# Patient Record
Sex: Male | Born: 1975
Health system: Southern US, Community
[De-identification: ages and names within clinical notes are randomized; demographics above are authoritative.]

## PROBLEM LIST (undated history)

## (undated) DIAGNOSIS — S92309A Fracture of unspecified metatarsal bone(s), unspecified foot, initial encounter for closed fracture: Secondary | ICD-10-CM

## (undated) DIAGNOSIS — Z5189 Encounter for other specified aftercare: Secondary | ICD-10-CM

## (undated) DIAGNOSIS — I10 Essential (primary) hypertension: Secondary | ICD-10-CM

## (undated) HISTORY — DX: Essential (primary) hypertension: I10

## (undated) HISTORY — DX: Encounter for other specified aftercare: Z51.89

## (undated) HISTORY — PX: OTHER SURGICAL HISTORY: SHX169

---

## 2015-06-13 DIAGNOSIS — Z85820 Personal history of malignant melanoma of skin: Secondary | ICD-10-CM | POA: Diagnosis not present

## 2015-06-13 DIAGNOSIS — Z1283 Encounter for screening for malignant neoplasm of skin: Secondary | ICD-10-CM | POA: Diagnosis not present

## 2015-06-13 DIAGNOSIS — Z08 Encounter for follow-up examination after completed treatment for malignant neoplasm: Secondary | ICD-10-CM | POA: Diagnosis not present

## 2015-06-13 DIAGNOSIS — L301 Dyshidrosis [pompholyx]: Secondary | ICD-10-CM | POA: Diagnosis not present

## 2015-07-22 DIAGNOSIS — H31093 Other chorioretinal scars, bilateral: Secondary | ICD-10-CM | POA: Diagnosis not present

## 2015-07-22 DIAGNOSIS — H0015 Chalazion left lower eyelid: Secondary | ICD-10-CM | POA: Diagnosis not present

## 2015-07-29 DIAGNOSIS — H0015 Chalazion left lower eyelid: Secondary | ICD-10-CM | POA: Diagnosis not present

## 2015-07-29 MED FILL — POLYMYXIN B/TMP EYE DROPS: 10000-0.1 | 7 days supply | Qty: 10 | Fill #0

## 2015-12-02 DIAGNOSIS — S92314A Nondisplaced fracture of first metatarsal bone, right foot, initial encounter for closed fracture: Secondary | ICD-10-CM | POA: Diagnosis not present

## 2015-12-05 DIAGNOSIS — S92314D Nondisplaced fracture of first metatarsal bone, right foot, subsequent encounter for fracture with routine healing: Secondary | ICD-10-CM | POA: Diagnosis not present

## 2015-12-06 ENCOUNTER — Encounter (HOSPITAL_BASED_OUTPATIENT_CLINIC_OR_DEPARTMENT_OTHER): Payer: Self-pay | Admitting: *Deleted

## 2015-12-08 NOTE — H&P (Signed)
[facility] [address], [place] [address] [phone] A Division of [facility]                                                                     RE: RANELL, ALYEA    [id number]   1975-06-25 PROGRESS NOTE: 12-05-15 Reason for visit: Follow up right first metatarsal fracture on December 02, 2015.   History of present illness: Pain is well controlled.  This occurred when a trailer hit the top of his foot.   Please see associated documentation for this clinic visit for further past medical, family, surgical and social history, review of systems, and exam findings as this was reviewed by me.  EXAMINATION: Well appearing male in no apparent distress. In a splint.  He has painless passive motion.  He is neurovascularly intact to the first toe and the lesser toes.    X-RAYS: I reviewed his previous films which demonstrate a shortened fracture of the first metatarsal.   ASSESSMENT/PLAN: Due to the significant shortening I do think he would benefit from ORIF of his fracture.  I cautioned him he will be non-weight bearing for at least four weeks postoperatively, probably six weeks.  Discussed the risks and benefits of ORIF of this fracture.  He would like to go forward with that.    Ernesta Amble.  Percell Miller, M.D.  Electronically verified by Ernesta Amble. Percell Miller, M.D. TDM:jjh D 11-06-15 T 11-07-15

## 2015-12-09 ENCOUNTER — Ambulatory Visit (HOSPITAL_BASED_OUTPATIENT_CLINIC_OR_DEPARTMENT_OTHER): Payer: 59 | Admitting: Certified Registered"

## 2015-12-09 ENCOUNTER — Encounter (HOSPITAL_BASED_OUTPATIENT_CLINIC_OR_DEPARTMENT_OTHER)
Admission: RE | Disposition: A | Discharge status: Home / Self Care | Payer: Self-pay | Source: Ambulatory Visit | Attending: Orthopedic Surgery

## 2015-12-09 ENCOUNTER — Ambulatory Visit (HOSPITAL_BASED_OUTPATIENT_CLINIC_OR_DEPARTMENT_OTHER)
Admission: RE | Admit: 2015-12-09 | Discharge: 2015-12-09 | Disposition: A | Discharge status: Home / Self Care | Payer: 59 | Source: Ambulatory Visit | Attending: Orthopedic Surgery | Admitting: Orthopedic Surgery

## 2015-12-09 ENCOUNTER — Encounter (HOSPITAL_BASED_OUTPATIENT_CLINIC_OR_DEPARTMENT_OTHER): Payer: Self-pay | Admitting: *Deleted

## 2015-12-09 DIAGNOSIS — S92311A Displaced fracture of first metatarsal bone, right foot, initial encounter for closed fracture: Secondary | ICD-10-CM | POA: Diagnosis not present

## 2015-12-09 DIAGNOSIS — S92301A Fracture of unspecified metatarsal bone(s), right foot, initial encounter for closed fracture: Secondary | ICD-10-CM | POA: Diagnosis not present

## 2015-12-09 HISTORY — PX: ORIF TOE FRACTURE: SHX5032

## 2015-12-09 HISTORY — DX: Fracture of unspecified metatarsal bone(s), unspecified foot, initial encounter for closed fracture: S92.309A

## 2015-12-09 SURGERY — OPEN REDUCTION INTERNAL FIXATION (ORIF) METATARSAL (TOE) FRACTURE
Anesthesia: Monitor Anesthesia Care | Site: Foot | Laterality: Right

## 2015-12-09 MED ORDER — SUGAMMADEX SODIUM 200 MG/2ML IV SOLN
INTRAVENOUS | Status: AC
  Filled 2015-12-09: qty 2

## 2015-12-09 MED ORDER — OXYCODONE HCL 5 MG/5ML PO SOLN: 5.0000 mg | Freq: Once | ORAL | Status: DC | PRN

## 2015-12-09 MED ORDER — FENTANYL CITRATE (PF) 100 MCG/2ML IJ SOLN
50.0000 ug | INTRAMUSCULAR | Status: DC | PRN
  Administered 2015-12-09 (×2): 100 ug via INTRAVENOUS

## 2015-12-09 MED ORDER — OXYCODONE-ACETAMINOPHEN 5-325 MG PO TABS: 1.0000 | ORAL_TABLET | ORAL | 0 refills | Status: DC | PRN

## 2015-12-09 MED ORDER — ONDANSETRON HCL 4 MG/2ML IJ SOLN
INTRAMUSCULAR | Status: AC
  Filled 2015-12-09: qty 2

## 2015-12-09 MED ORDER — ROCURONIUM BROMIDE 10 MG/ML (PF) SYRINGE
PREFILLED_SYRINGE | INTRAVENOUS | Status: AC
  Filled 2015-12-09: qty 10

## 2015-12-09 MED ORDER — DOCUSATE SODIUM 100 MG PO CAPS: 100.0000 mg | ORAL_CAPSULE | Freq: Two times a day (BID) | ORAL | 0 refills | Status: DC

## 2015-12-09 MED ORDER — FENTANYL CITRATE (PF) 100 MCG/2ML IJ SOLN
INTRAMUSCULAR | Status: AC
  Filled 2015-12-09: qty 2

## 2015-12-09 MED ORDER — PROMETHAZINE HCL 25 MG/ML IJ SOLN: 6.2500 mg | INTRAMUSCULAR | Status: DC | PRN

## 2015-12-09 MED ORDER — PROPOFOL 10 MG/ML IV BOLUS
INTRAVENOUS | Status: DC | PRN
  Administered 2015-12-09: 50 mg via INTRAVENOUS

## 2015-12-09 MED ORDER — POVIDONE-IODINE 10 % EX SWAB
2.0000 | Freq: Once | CUTANEOUS | Status: DC
Start: 2015-12-09 — End: 2015-12-09

## 2015-12-09 MED ORDER — METHOCARBAMOL 500 MG PO TABS: 500.0000 mg | ORAL_TABLET | Freq: Four times a day (QID) | ORAL | 0 refills | Status: DC | PRN

## 2015-12-09 MED ORDER — IBUPROFEN 800 MG PO TABS: 800.0000 mg | ORAL_TABLET | Freq: Three times a day (TID) | ORAL | 2 refills | Status: AC | PRN

## 2015-12-09 MED ORDER — PROPOFOL 500 MG/50ML IV EMUL
INTRAVENOUS | Status: DC | PRN
  Administered 2015-12-09: 150 ug/kg/min via INTRAVENOUS

## 2015-12-09 MED ORDER — ASPIRIN EC 81 MG PO TBEC: 81.0000 mg | DELAYED_RELEASE_TABLET | Freq: Every day | ORAL | 0 refills | Status: DC

## 2015-12-09 MED ORDER — SUGAMMADEX SODIUM 500 MG/5ML IV SOLN
INTRAVENOUS | Status: AC
  Filled 2015-12-09: qty 5

## 2015-12-09 MED ORDER — SCOPOLAMINE 1 MG/3DAYS TD PT72: 1.0000 | MEDICATED_PATCH | Freq: Once | TRANSDERMAL | Status: DC | PRN

## 2015-12-09 MED ORDER — ONDANSETRON HCL 4 MG PO TABS: 4.0000 mg | ORAL_TABLET | Freq: Three times a day (TID) | ORAL | 0 refills | Status: DC | PRN

## 2015-12-09 MED ORDER — MEPERIDINE HCL 25 MG/ML IJ SOLN: 6.2500 mg | INTRAMUSCULAR | Status: DC | PRN

## 2015-12-09 MED ORDER — BUPIVACAINE HCL (PF) 0.5 % IJ SOLN
INTRAMUSCULAR | Status: AC
Start: 2015-12-09 — End: 2015-12-09
  Filled 2015-12-09: qty 30

## 2015-12-09 MED ORDER — ACETAMINOPHEN 500 MG PO TABS
1000.0000 mg | ORAL_TABLET | Freq: Once | ORAL | Status: AC
  Administered 2015-12-09: 1000 mg via ORAL

## 2015-12-09 MED ORDER — MIDAZOLAM HCL 2 MG/2ML IJ SOLN
1.0000 mg | INTRAMUSCULAR | Status: DC | PRN
  Administered 2015-12-09 (×2): 2 mg via INTRAVENOUS

## 2015-12-09 MED ORDER — OXYCODONE HCL 5 MG PO TABS: 5.0000 mg | ORAL_TABLET | Freq: Once | ORAL | Status: DC | PRN

## 2015-12-09 MED ORDER — LACTATED RINGERS IV SOLN
INTRAVENOUS | Status: DC
  Administered 2015-12-09 (×2): via INTRAVENOUS

## 2015-12-09 MED ORDER — HYDROMORPHONE HCL 1 MG/ML IJ SOLN: 0.2500 mg | INTRAMUSCULAR | Status: DC | PRN

## 2015-12-09 MED ORDER — MIDAZOLAM HCL 2 MG/2ML IJ SOLN
INTRAMUSCULAR | Status: AC
  Filled 2015-12-09: qty 2

## 2015-12-09 MED ORDER — GLYCOPYRROLATE 0.2 MG/ML IJ SOLN: 0.2000 mg | Freq: Once | INTRAMUSCULAR | Status: DC | PRN

## 2015-12-09 MED ORDER — PROPOFOL 10 MG/ML IV BOLUS
INTRAVENOUS | Status: AC
  Filled 2015-12-09: qty 20

## 2015-12-09 MED ORDER — ACETAMINOPHEN 500 MG PO TABS
ORAL_TABLET | ORAL | Status: AC
  Filled 2015-12-09: qty 2

## 2015-12-09 MED ORDER — DEXAMETHASONE SODIUM PHOSPHATE 10 MG/ML IJ SOLN
INTRAMUSCULAR | Status: AC
  Filled 2015-12-09: qty 1

## 2015-12-09 MED ORDER — CEFAZOLIN SODIUM-DEXTROSE 2-4 GM/100ML-% IV SOLN
2.0000 g | INTRAVENOUS | Status: AC
  Administered 2015-12-09: 2 g via INTRAVENOUS

## 2015-12-09 MED ORDER — CHLORHEXIDINE GLUCONATE 4 % EX LIQD: 60.0000 mL | Freq: Once | CUTANEOUS | Status: DC

## 2015-12-09 MED ORDER — OMEPRAZOLE 20 MG PO CPDR: 20.0000 mg | DELAYED_RELEASE_CAPSULE | Freq: Every day | ORAL | 2 refills | Status: DC

## 2015-12-09 MED ORDER — CEFAZOLIN SODIUM-DEXTROSE 2-4 GM/100ML-% IV SOLN
INTRAVENOUS | Status: AC
  Filled 2015-12-09: qty 100

## 2015-12-09 MED FILL — OXYCODONE/APAP 5-325: 5-325 | 4 days supply | Qty: 50 | Fill #0

## 2015-12-09 MED FILL — ONDANSETRON HCL 4 MG TABLET: 4 | 13 days supply | Qty: 40 | Fill #0

## 2015-12-09 MED FILL — METHOCARBAMOL 500 MG TABLET: 500 | 10 days supply | Qty: 40 | Fill #0

## 2015-12-09 MED FILL — OMEPRAZOLE DR 20 MG CAPSULE: 20 | 30 days supply | Qty: 30 | Fill #0

## 2015-12-09 MED FILL — IBUPROFEN 800 MG TABLET: 800 | 10 days supply | Qty: 30 | Fill #0

## 2015-12-09 SURGICAL SUPPLY — 79 items
16mmx2.7mm bone screw (Screw) ×6 IMPLANT
2.0MM DRILLBIT ×2 IMPLANT
2.7MM OVERDRILLBIT ×3 IMPLANT
22mmx2.7 locking screw (Screw) ×3 IMPLANT
28mmx2.7mm locking screw (Screw) ×3 IMPLANT
BANDAGE ACE 3X5.8 VEL STRL LF (GAUZE/BANDAGES/DRESSINGS) ×3 IMPLANT
BANDAGE ACE 4X5 VEL STRL LF (GAUZE/BANDAGES/DRESSINGS) ×3 IMPLANT
BANDAGE ESMARK 6X9 LF (GAUZE/BANDAGES/DRESSINGS) ×1 IMPLANT
BLADE SURG 15 STRL LF DISP TIS (BLADE) ×1 IMPLANT
BLADE SURG 15 STRL SS (BLADE) ×2
BNDG COHESIVE 4X5 TAN STRL (GAUZE/BANDAGES/DRESSINGS) ×3 IMPLANT
BNDG ESMARK 4X9 LF (GAUZE/BANDAGES/DRESSINGS) IMPLANT
BNDG ESMARK 6X9 LF (GAUZE/BANDAGES/DRESSINGS) ×3
CHLORAPREP W/TINT 26ML (MISCELLANEOUS) ×3 IMPLANT
CLOSURE STERI-STRIP 1/2X4 (GAUZE/BANDAGES/DRESSINGS) ×1
CLSR STERI-STRIP ANTIMIC 1/2X4 (GAUZE/BANDAGES/DRESSINGS) ×2 IMPLANT
COUNTERSINK ×2 IMPLANT
COVER BACK TABLE 60X90IN (DRAPES) ×3 IMPLANT
COVER MAYO STAND STRL (DRAPES) ×3 IMPLANT
CUFF TOURNIQUET SINGLE 34IN LL (TOURNIQUET CUFF) IMPLANT
DECANTER SPIKE VIAL GLASS SM (MISCELLANEOUS) IMPLANT
DRAPE EXTREMITY T 121X128X90 (DRAPE) ×3 IMPLANT
DRAPE IMP U-DRAPE 54X76 (DRAPES) ×3 IMPLANT
DRAPE OEC MINIVIEW 54X84 (DRAPES) ×3 IMPLANT
DRAPE SURG 17X23 STRL (DRAPES) IMPLANT
DRAPE U-SHAPE 47X51 STRL (DRAPES) ×3 IMPLANT
DRILL 2.6X122MM WL AO SHAFT (BIT) ×3 IMPLANT
DRSG EMULSION OIL 3X3 NADH (GAUZE/BANDAGES/DRESSINGS) ×3 IMPLANT
ELECT REM PT RETURN 9FT ADLT (ELECTROSURGICAL) ×3
ELECTRODE REM PT RTRN 9FT ADLT (ELECTROSURGICAL) ×1 IMPLANT
GAUZE SPONGE 4X4 12PLY STRL (GAUZE/BANDAGES/DRESSINGS) ×3 IMPLANT
GAUZE XEROFORM 1X8 LF (GAUZE/BANDAGES/DRESSINGS) IMPLANT
GLOVE BIO SURGEON STRL SZ 6.5 (GLOVE) ×2 IMPLANT
GLOVE BIO SURGEON STRL SZ7.5 (GLOVE) ×6 IMPLANT
GLOVE BIO SURGEONS STRL SZ 6.5 (GLOVE) ×1
GLOVE BIOGEL PI IND STRL 7.0 (GLOVE) ×2 IMPLANT
GLOVE BIOGEL PI IND STRL 8 (GLOVE) ×2 IMPLANT
GLOVE BIOGEL PI INDICATOR 7.0 (GLOVE) ×4
GLOVE BIOGEL PI INDICATOR 8 (GLOVE) ×4
GOWN STRL REUS W/ TWL LRG LVL3 (GOWN DISPOSABLE) ×4 IMPLANT
GOWN STRL REUS W/ TWL XL LVL3 (GOWN DISPOSABLE) ×1 IMPLANT
GOWN STRL REUS W/TWL LRG LVL3 (GOWN DISPOSABLE) ×8
GOWN STRL REUS W/TWL XL LVL3 (GOWN DISPOSABLE) ×2
NEEDLE HYPO 25X1 1.5 SAFETY (NEEDLE) ×3 IMPLANT
NS IRRIG 1000ML POUR BTL (IV SOLUTION) ×3 IMPLANT
PACK BASIN DAY SURGERY FS (CUSTOM PROCEDURE TRAY) ×3 IMPLANT
PAD CAST 3X4 CTTN HI CHSV (CAST SUPPLIES) IMPLANT
PAD CAST 4YDX4 CTTN HI CHSV (CAST SUPPLIES) ×1 IMPLANT
PADDING CAST ABS 4INX4YD NS (CAST SUPPLIES) ×2
PADDING CAST ABS COTTON 4X4 ST (CAST SUPPLIES) ×1 IMPLANT
PADDING CAST COTTON 3X4 STRL (CAST SUPPLIES)
PADDING CAST COTTON 4X4 STRL (CAST SUPPLIES) ×2
PENCIL BUTTON HOLSTER BLD 10FT (ELECTRODE) ×3 IMPLANT
SCREW BONE 2.7X20MM (Screw) ×3 IMPLANT
SCREW BONE 2.7X22 (Screw) ×3 IMPLANT
SCREW BONE 2.7X34MM (Screw) ×3 IMPLANT
SLEEVE SCD COMPRESS KNEE MED (MISCELLANEOUS) IMPLANT
SPLINT FAST PLASTER 5X30 (CAST SUPPLIES) ×40
SPLINT PLASTER CAST FAST 5X30 (CAST SUPPLIES) ×20 IMPLANT
SPONGE LAP 4X18 X RAY DECT (DISPOSABLE) ×3 IMPLANT
STOCKINETTE 6  STRL (DRAPES)
STOCKINETTE 6 STRL (DRAPES) IMPLANT
SUCTION FRAZIER HANDLE 10FR (MISCELLANEOUS) ×2
SUCTION TUBE FRAZIER 10FR DISP (MISCELLANEOUS) ×1 IMPLANT
SUT ETHILON 3 0 PS 1 (SUTURE) IMPLANT
SUT MON AB 2-0 CT1 36 (SUTURE) IMPLANT
SUT MON AB 4-0 PC3 18 (SUTURE) ×3 IMPLANT
SUT PROLENE 3 0 PS 2 (SUTURE) IMPLANT
SUT VIC AB 2-0 SH 27 (SUTURE)
SUT VIC AB 2-0 SH 27XBRD (SUTURE) IMPLANT
SUT VIC AB 3-0 FS2 27 (SUTURE) IMPLANT
SYR 20CC LL (SYRINGE) IMPLANT
SYR BULB 3OZ (MISCELLANEOUS) ×3 IMPLANT
TOWEL OR 17X24 6PK STRL BLUE (TOWEL DISPOSABLE) ×3 IMPLANT
TOWEL OR NON WOVEN STRL DISP B (DISPOSABLE) ×3 IMPLANT
TUBE CONNECTING 20'X1/4 (TUBING)
TUBE CONNECTING 20X1/4 (TUBING) IMPLANT
UNDERPAD 30X30 (UNDERPADS AND DIAPERS) ×3 IMPLANT
curved 5 hole plate right (Plate) ×3 IMPLANT

## 2015-12-09 NOTE — Progress Notes (Signed)
Assisted Dr. Germeroth with right, ultrasound guided, popliteal block. Side rails up, monitors on throughout procedure. See vital signs in flow sheet. Tolerated Procedure well. 

## 2015-12-09 NOTE — Discharge Instructions (Signed)
Elevate foot and apply ice as much as possible.  Diet: As you were doing prior to hospitalization   Shower:  You have a splint on, leave the splint in place and keep the splint dry with a plastic bag.  Dressing:  You have a splint, then just leave the splint in place and we will change your bandages during your first follow-up appointment.    Activity:  Increase activity slowly as tolerated, but follow the weight bearing instructions below.  The rules on driving is that you can not be taking narcotics while you drive, and you must feel in control of the vehicle.    Weight Bearing:   Non weight bearing right leg    To prevent constipation: you may use a stool softener such as -  Colace (over the counter) 100 mg by mouth twice a day  Drink plenty of fluids (prune juice may be helpful) and high fiber foods Miralax (over the counter) for constipation as needed.    Itching:  If you experience itching with your medications, try taking only a single pain pill, or even half a pain pill at a time.  You may take up to 10 pain pills per day, and you can also use benadryl over the counter for itching or also to help with sleep.   Precautions:  If you experience chest pain or shortness of breath - call 911 immediately for transfer to the hospital emergency department!!  If you develop a fever greater that 101 F, purulent drainage from wound, increased redness or drainage from wound, or calf pain -- Call the office at [phone]                                                Follow- Up Appointment:  Please call for an appointment to be seen in 2 weeks [place] - [phone]    Post Anesthesia Home Care Instructions  Activity: Get plenty of rest for the remainder of the day. A responsible adult should stay with you for 24 hours following the procedure.  For the next 24 hours, DO NOT: -Drive a car -Paediatric nurse -Drink alcoholic beverages -Take any medication unless instructed by your  physician -Make any legal decisions or sign important papers.  Meals: Start with liquid foods such as gelatin or soup. Progress to regular foods as tolerated. Avoid greasy, spicy, heavy foods. If nausea and/or vomiting occur, drink only clear liquids until the nausea and/or vomiting subsides. Call your physician if vomiting continues.  Special Instructions/Symptoms: Your throat may feel dry or sore from the anesthesia or the breathing tube placed in your throat during surgery. If this causes discomfort, gargle with warm salt water. The discomfort should disappear within 24 hours.  If you had a scopolamine patch placed behind your ear for the management of post- operative nausea and/or vomiting:  1. The medication in the patch is effective for 72 hours, after which it should be removed.  Wrap patch in a tissue and discard in the trash. Wash hands thoroughly with soap and water. 2. You may remove the patch earlier than 72 hours if you experience unpleasant side effects which may include dry mouth, dizziness or visual disturbances. 3. Avoid touching the patch. Wash your hands with soap and water after contact with the patch.

## 2015-12-09 NOTE — Op Note (Signed)
12/09/2015  3:44 PM  PATIENT:  Francisco Johns    PRE-OPERATIVE DIAGNOSIS:  right great toe fracture   POST-OPERATIVE DIAGNOSIS:  Same  PROCEDURE:  OPEN REDUCTION INTERNAL FIXATION (ORIF) METATARSAL (TOE) FRACTURE, first metatarsal  SURGEON:  MURPHY, Ernesta Amble, MD  ASSISTANT: Roxan Hockey, PA-C, he was present and scrubbed throughout the case, critical for completion in a timely fashion, and for retraction, instrumentation, and closure.   ANESTHESIA:   MAC, block  PREOPERATIVE INDICATIONS:  Francisco Johns is a  40 y.o. male with a diagnosis of right great toe fracture  who failed conservative measures and elected for surgical management.    The risks benefits and alternatives were discussed with the patient preoperatively including but not limited to the risks of infection, bleeding, nerve injury, cardiopulmonary complications, the need for revision surgery, among others, and the patient was willing to proceed.  OPERATIVE IMPLANTS: stryker titanium plate  OPERATIVE FINDINGS: shortened fracture  BLOOD LOSS: min  COMPLICATIONS: none  TOURNIQUET TIME: 31min  OPERATIVE PROCEDURE:  Patient was identified in the preoperative holding area and site was marked by me He was transported to the operating theater and placed on the table in supine position taking care to pad all bony prominences. After a preincinduction time out anesthesia was induced. The right lower extremity was prepped and draped in normal sterile fashion and a pre-incision timeout was performed. He received ancef for preoperative antibiotics.   I made a dorsomedial incision over his first metatarsal I protected his superficial cutaneous nerve I protected his extensor tendons. I dissected down to the periosteum and incised this in line with the incision.  Identified his fracture site I cleaned of soft tissue I pulled it out to length and placed a lag screw across the fracture I was happy with the purchase here. There  was some comminution but I was able to bypass this.  I then placed a custom contoured plate over the dorsomedial surface of the metatarsal so as not to irritate tendons. I placed a locking screw proximal with a lag screw as well. I placed 3 nonlocking screws distally. As very happy with the purchase of all screws.  Final x-rays demonstrated appropriate reduction and placement of all hardware. I then thoroughly irrigated his incision closed his skin in layers. Sterile dressing was applied he was awoken and taken the PACU in stable condition  POST OPERATIVE PLAN: NWB RLE, ASA for dvt px.

## 2015-12-09 NOTE — Anesthesia Postprocedure Evaluation (Signed)
Anesthesia Post Note  Patient: Francisco Johns  Procedure(s) Performed: Procedure(s) (LRB): OPEN REDUCTION INTERNAL FIXATION (ORIF) METATARSAL (TOE) FRACTURE, first metatarsal (Right)  Patient location during evaluation: PACU Anesthesia Type: Regional Level of consciousness: awake and alert and oriented Pain management: pain level controlled Vital Signs Assessment: post-procedure vital signs reviewed and stable Respiratory status: spontaneous breathing, nonlabored ventilation and respiratory function stable Cardiovascular status: blood pressure returned to baseline and stable Anesthetic complications: no    Last Vitals:  Vitals:   12/09/15 1616 12/09/15 1628  BP: 124/90 120/84  Pulse: 70   Resp: 14   Temp:      Last Pain:  Vitals:   12/09/15 1625  TempSrc:   PainSc: 0-No pain        RLE Motor Response: No movement due to regional block (12/09/15 1625) RLE Sensation: Numbness (12/09/15 1625)      Nolon Nations

## 2015-12-09 NOTE — Interval H&P Note (Signed)
History and Physical Interval Note:  12/09/2015 12:43 PM  Francisco Johns  has presented today for surgery, with the diagnosis of right great toe fracture   The various methods of treatment have been discussed with the patient and family. After consideration of risks, benefits and other options for treatment, the patient has consented to  Procedure(s): OPEN REDUCTION INTERNAL FIXATION (ORIF) METATARSAL (TOE) FRACTURE, first metatarsal (Right) as a surgical intervention .  The patient's history has been reviewed, patient examined, no change in status, stable for surgery.  I have reviewed the patient's chart and labs.  Questions were answered to the patient's satisfaction.     MURPHY, TIMOTHY D

## 2015-12-09 NOTE — Transfer of Care (Signed)
Immediate Anesthesia Transfer of Care Note  Patient: Francisco Johns  Procedure(s) Performed: Procedure(s): OPEN REDUCTION INTERNAL FIXATION (ORIF) METATARSAL (TOE) FRACTURE, first metatarsal (Right)  Patient Location: PACU  Anesthesia Type:MAC combined with regional for post-op pain  Level of Consciousness: awake, alert  and oriented  Airway & Oxygen Therapy: Patient Spontanous Breathing  Post-op Assessment: Report given to RN and Post -op Vital signs reviewed and stable  Post vital signs: Reviewed and stable  Last Vitals:  Vitals:   12/09/15 1354 12/09/15 1355  BP:    Pulse: 71 62  Resp: 19 17  Temp:      Last Pain:  Vitals:   12/09/15 1229  TempSrc: Oral  PainSc: 0-No pain      Patients Stated Pain Goal: 0 ([phone] [id number])  Complications: No apparent anesthesia complications

## 2015-12-09 NOTE — Anesthesia Preprocedure Evaluation (Signed)
Anesthesia Evaluation  Patient identified by MRN, date of birth, ID band Patient awake    Reviewed: Allergy & Precautions, NPO status , Patient's Chart, lab work & pertinent test results  Airway Mallampati: II  TM Distance: >3 FB Neck ROM: Full    Dental no notable dental hx.    Pulmonary neg pulmonary ROS,    Pulmonary exam normal breath sounds clear to auscultation       Cardiovascular negative cardio ROS Normal cardiovascular exam Rhythm:Regular Rate:Normal     Neuro/Psych negative neurological ROS  negative psych ROS   GI/Hepatic negative GI ROS, Neg liver ROS,   Endo/Other  negative endocrine ROS  Renal/GU negative Renal ROS     Musculoskeletal negative musculoskeletal ROS (+)   Abdominal   Peds  Hematology negative hematology ROS (+)   Anesthesia Other Findings   Reproductive/Obstetrics negative OB ROS                             Anesthesia Physical Anesthesia Plan  ASA: II  Anesthesia Plan: Regional   Post-op Pain Management:    Induction:   Airway Management Planned:   Additional Equipment:   Intra-op Plan:   Post-operative Plan:   Informed Consent: I have reviewed the patients History and Physical, chart, labs and discussed the procedure including the risks, benefits and alternatives for the proposed anesthesia with the patient or authorized representative who has indicated his/her understanding and acceptance.   Dental advisory given  Plan Discussed with: CRNA  Anesthesia Plan Comments:         Anesthesia Quick Evaluation

## 2015-12-14 ENCOUNTER — Encounter (HOSPITAL_BASED_OUTPATIENT_CLINIC_OR_DEPARTMENT_OTHER): Payer: Self-pay | Admitting: Orthopedic Surgery

## 2015-12-21 DIAGNOSIS — S92301D Fracture of unspecified metatarsal bone(s), right foot, subsequent encounter for fracture with routine healing: Secondary | ICD-10-CM | POA: Diagnosis not present

## 2016-01-16 DIAGNOSIS — S92314D Nondisplaced fracture of first metatarsal bone, right foot, subsequent encounter for fracture with routine healing: Secondary | ICD-10-CM | POA: Diagnosis not present

## 2016-01-16 DIAGNOSIS — S92301D Fracture of unspecified metatarsal bone(s), right foot, subsequent encounter for fracture with routine healing: Secondary | ICD-10-CM | POA: Diagnosis not present

## 2016-02-13 DIAGNOSIS — S92301D Fracture of unspecified metatarsal bone(s), right foot, subsequent encounter for fracture with routine healing: Secondary | ICD-10-CM | POA: Diagnosis not present

## 2016-03-26 DIAGNOSIS — S92301D Fracture of unspecified metatarsal bone(s), right foot, subsequent encounter for fracture with routine healing: Secondary | ICD-10-CM | POA: Diagnosis not present

## 2016-05-28 ENCOUNTER — Encounter (HOSPITAL_COMMUNITY): Payer: Self-pay | Admitting: Emergency Medicine

## 2016-05-28 ENCOUNTER — Emergency Department (HOSPITAL_COMMUNITY): Payer: 59

## 2016-05-28 ENCOUNTER — Emergency Department (HOSPITAL_COMMUNITY)
Admission: EM | Admit: 2016-05-28 | Discharge: 2016-05-28 | Disposition: A | Discharge status: Other Acute Inpatient Hospital | Payer: 59 | Attending: Emergency Medicine | Admitting: Emergency Medicine

## 2016-05-28 DIAGNOSIS — S68411A Complete traumatic amputation of right hand at wrist level, initial encounter: Secondary | ICD-10-CM | POA: Diagnosis not present

## 2016-05-28 DIAGNOSIS — W319XXA Contact with unspecified machinery, initial encounter: Secondary | ICD-10-CM | POA: Insufficient documentation

## 2016-05-28 DIAGNOSIS — M79601 Pain in right arm: Secondary | ICD-10-CM | POA: Diagnosis not present

## 2016-05-28 DIAGNOSIS — T1490XA Injury, unspecified, initial encounter: Secondary | ICD-10-CM | POA: Insufficient documentation

## 2016-05-28 DIAGNOSIS — D62 Acute posthemorrhagic anemia: Secondary | ICD-10-CM | POA: Insufficient documentation

## 2016-05-28 DIAGNOSIS — S6981XA Other specified injuries of right wrist, hand and finger(s), initial encounter: Secondary | ICD-10-CM | POA: Diagnosis not present

## 2016-05-28 DIAGNOSIS — Y999 Unspecified external cause status: Secondary | ICD-10-CM | POA: Diagnosis not present

## 2016-05-28 DIAGNOSIS — M2530 Other instability, unspecified joint: Secondary | ICD-10-CM | POA: Diagnosis not present

## 2016-05-28 DIAGNOSIS — Y939 Activity, unspecified: Secondary | ICD-10-CM | POA: Insufficient documentation

## 2016-05-28 DIAGNOSIS — S61501A Unspecified open wound of right wrist, initial encounter: Secondary | ICD-10-CM | POA: Diagnosis not present

## 2016-05-28 DIAGNOSIS — Y929 Unspecified place or not applicable: Secondary | ICD-10-CM | POA: Diagnosis not present

## 2016-05-28 DIAGNOSIS — R509 Fever, unspecified: Secondary | ICD-10-CM | POA: Diagnosis not present

## 2016-05-28 DIAGNOSIS — S5290XA Unspecified fracture of unspecified forearm, initial encounter for closed fracture: Secondary | ICD-10-CM | POA: Diagnosis not present

## 2016-05-28 DIAGNOSIS — S52591A Other fractures of lower end of right radius, initial encounter for closed fracture: Secondary | ICD-10-CM | POA: Diagnosis not present

## 2016-05-28 DIAGNOSIS — T148XXA Other injury of unspecified body region, initial encounter: Secondary | ICD-10-CM | POA: Diagnosis not present

## 2016-05-28 DIAGNOSIS — S52571A Other intraarticular fracture of lower end of right radius, initial encounter for closed fracture: Secondary | ICD-10-CM | POA: Diagnosis not present

## 2016-05-28 DIAGNOSIS — S58111A Complete traumatic amputation at level between elbow and wrist, right arm, initial encounter: Secondary | ICD-10-CM | POA: Insufficient documentation

## 2016-05-28 DIAGNOSIS — R061 Stridor: Secondary | ICD-10-CM | POA: Diagnosis not present

## 2016-05-28 DIAGNOSIS — S68411D Complete traumatic amputation of right hand at wrist level, subsequent encounter: Secondary | ICD-10-CM | POA: Diagnosis not present

## 2016-05-28 DIAGNOSIS — M25341 Other instability, right hand: Secondary | ICD-10-CM | POA: Diagnosis not present

## 2016-05-28 LAB — PREPARE FRESH FROZEN PLASMA
Blood Product Expiration Date: 201802132359
Blood Product Expiration Date: 201802132359
ISSUE DATE / TIME: 201802051158
ISSUE DATE / TIME: 201802051158
UNIT TYPE AND RH: 6200
Unit Type and Rh: 6200

## 2016-05-28 LAB — TYPE AND SCREEN
ABO/RH(D): O POS
ANTIBODY SCREEN: NEGATIVE
UNIT DIVISION: 0
UNIT DIVISION: 0

## 2016-05-28 LAB — CBC WITH DIFFERENTIAL/PLATELET
BASOS ABS: 0 10*3/uL (ref 0.0–0.1)
BASOS PCT: 0 %
EOS ABS: 0.2 10*3/uL (ref 0.0–0.7)
EOS PCT: 2 %
HCT: 39.3 % (ref 39.0–52.0)
HEMOGLOBIN: 13.4 g/dL (ref 13.0–17.0)
LYMPHS ABS: 4 10*3/uL (ref 0.7–4.0)
Lymphocytes Relative: 57 %
MCH: 30.5 pg (ref 26.0–34.0)
MCHC: 34.1 g/dL (ref 30.0–36.0)
MCV: 89.5 fL (ref 78.0–100.0)
Monocytes Absolute: 0.5 10*3/uL (ref 0.1–1.0)
Monocytes Relative: 7 %
NEUTROS PCT: 34 %
Neutro Abs: 2.4 10*3/uL (ref 1.7–7.7)
PLATELETS: 191 10*3/uL (ref 150–400)
RBC: 4.39 MIL/uL (ref 4.22–5.81)
RDW: 12.6 % (ref 11.5–15.5)
WBC: 7 10*3/uL (ref 4.0–10.5)

## 2016-05-28 LAB — ABO/RH: ABO/RH(D): O POS

## 2016-05-28 LAB — I-STAT CHEM 8, ED
BUN: 22 mg/dL — AB (ref 6–20)
CALCIUM ION: 1.07 mmol/L — AB (ref 1.15–1.40)
CHLORIDE: 106 mmol/L (ref 101–111)
Creatinine, Ser: 0.9 mg/dL (ref 0.61–1.24)
Glucose, Bld: 135 mg/dL — ABNORMAL HIGH (ref 65–99)
HEMATOCRIT: 40 % (ref 39.0–52.0)
Hemoglobin: 13.6 g/dL (ref 13.0–17.0)
POTASSIUM: 3.8 mmol/L (ref 3.5–5.1)
SODIUM: 140 mmol/L (ref 135–145)
TCO2: 23 mmol/L (ref 0–100)

## 2016-05-28 LAB — CDS SEROLOGY

## 2016-05-28 LAB — I-STAT CG4 LACTIC ACID, ED: LACTIC ACID, VENOUS: 1.77 mmol/L (ref 0.5–1.9)

## 2016-05-28 MED ORDER — FENTANYL CITRATE (PF) 100 MCG/2ML IJ SOLN
100.0000 ug | INTRAMUSCULAR | Status: DC | PRN
  Administered 2016-05-28: 100 ug via INTRAVENOUS

## 2016-05-28 MED ORDER — FENTANYL CITRATE (PF) 100 MCG/2ML IJ SOLN
INTRAMUSCULAR | Status: AC
  Administered 2016-05-28: 100 ug
  Filled 2016-05-28: qty 2

## 2016-05-28 MED ORDER — SODIUM CHLORIDE 0.9 % IV SOLN
INTRAVENOUS | Status: AC | PRN
  Administered 2016-05-28: 100 mL/h via INTRAVENOUS

## 2016-05-28 MED ORDER — FENTANYL CITRATE (PF) 100 MCG/2ML IJ SOLN
INTRAMUSCULAR | Status: AC
  Filled 2016-05-28: qty 2

## 2016-05-28 MED ORDER — HYDROMORPHONE HCL 2 MG/ML IJ SOLN
1.0000 mg | Freq: Once | INTRAMUSCULAR | Status: DC
Start: 2016-05-28 — End: 2016-05-28

## 2016-05-28 MED ORDER — TETANUS-DIPHTH-ACELL PERTUSSIS 5-2.5-18.5 LF-MCG/0.5 IM SUSP
INTRAMUSCULAR | Status: AC
  Administered 2016-05-28: 0.5 mL via INTRAMUSCULAR
  Filled 2016-05-28: qty 0.5

## 2016-05-28 MED ORDER — SODIUM CHLORIDE 0.9 % IV SOLN: Freq: Once | INTRAVENOUS | Status: DC

## 2016-05-28 MED ORDER — CEFAZOLIN SODIUM-DEXTROSE 2-4 GM/100ML-% IV SOLN
INTRAVENOUS | Status: AC
  Administered 2016-05-28: 2000 mg
  Filled 2016-05-28: qty 100

## 2016-05-28 NOTE — ED Notes (Signed)
Pt to ED via [facility] with a complete amputation to right hand at wrist. Tourniquet placed per EMS -- remains in place on right forearm, bleeding controlled. EMS started IV 16G left AC -- has received Morphine 10mg  prior to arrival.  Family notified per social work. Wife is a Marine scientist at [employer].

## 2016-05-28 NOTE — Progress Notes (Signed)
   05/28/16 1215  Clinical Encounter Type  Visited With Patient and family together  Visit Type ED  Spiritual Encounters  Spiritual Needs Emotional  Stress Factors  Patient Stress Factors Major life changes  Family Stress Factors Major life changes  Introduction to Pt. CSW contacted wife who was on her way. Assisted bringing wife to Pt. Offered prayer.

## 2016-05-28 NOTE — Consult Note (Signed)
[facility] Consult Note  Francisco Johns 1975/10/02  [id number].    Requesting MD: Alvino Chapel, MD Chief Complaint/Reason for Consult: right arm amputation  HPI:  Francisco Johns is a 41 y/o male without significant medical history who presented to [facility] as a level one trauma activation after transecting his right lower arm/wrist in a wood splitter at work. 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 Patient reports immediately calling 911. He denies loss of consciousness. He c/o stinging, non-radiating, 8/10 pain in his right wrist. He takes no regular medications. Denies use of blood thinning medications. He reports NKDA.   ROS: Review of Systems  Constitutional: Negative for chills and fever.  HENT: Negative for nosebleeds.   Eyes: Negative for blurred vision and double vision.  Respiratory: Negative for shortness of breath.   Cardiovascular: Negative for chest pain and palpitations.  Gastrointestinal: Negative for abdominal pain, constipation, diarrhea, nausea and vomiting.  Genitourinary: Negative for dysuria, flank pain, frequency, hematuria and urgency.  Musculoskeletal:       Right wrist pain    No family history on file.  No past medical history on file.  No past surgical history on file.  Social History:  has no tobacco, alcohol, and drug history on file.  Allergies: Allergies not on file   (Not in a hospital admission)  There were no vitals taken for this visit. Physical Exam: Physical Exam  Constitutional: He appears well-developed and well-nourished. No distress.  HENT:  Head: Normocephalic and atraumatic.  Nose: Nose normal.  Mouth/Throat: Oropharynx is clear and moist.  Eyes: EOM are normal. Pupils are equal, round, and reactive to light.  Neck: Normal range of motion. Neck supple. No JVD present. No tracheal deviation present. No thyromegaly present.  Cardiovascular: Regular rhythm, normal heart sounds and intact  distal pulses.  Exam reveals no gallop and no friction rub.   No murmur heard. Pulmonary/Chest: Effort normal and breath sounds normal. No respiratory distress. He exhibits no tenderness.  Abdominal: Soft. Bowel sounds are normal. He exhibits no distension. There is no tenderness. There is no guarding. A hernia (reducible umbilical hernia.) is present.  Musculoskeletal: He exhibits deformity.       Arms:  Results for orders placed or performed during the hospital encounter of 05/28/16 (from the past 48 hour(s))  Type and screen     Status: None (Preliminary result)   Collection Time: 05/28/16 11:55 AM  Result Value Ref Range   ISSUE DATE / TIME [id number]    Blood Product Unit Number [id number]    PRODUCT CODE E0336V00    Unit Type and Rh 9500    Blood Product Expiration Date [id number]    ISSUE DATE / TIME [id number]    Blood Product Unit Number [id number]    PRODUCT CODE E0336V00    Unit Type and Rh 9500    Blood Product Expiration Date [id number]   Prepare fresh frozen plasma     Status: None (Preliminary result)   Collection Time: 05/28/16 11:55 AM  Result Value Ref Range   ISSUE DATE / TIME [id number]    Blood Product Unit Number [id number]    PRODUCT CODE E2457V00    Unit Type and Rh 6200    Blood Product Expiration Date [id number]    ISSUE DATE / TIME [id number]    Blood Product Unit Number [id number]    PRODUCT CODE [id number]    Unit Type and Rh 6200    Blood Product Expiration  Date MH:3153007    No results found.  Assessment/Plan Amputation distal right forearm - 2g Ancef and tetanus administered - IVF, pain control - CBC, I-stat Chem-8 - transfer to [facility], nearest reimplantation center; Vernona Rieger, MD accepting [name] 05/28/2016, 12:29 PM Pager: [phone] Consults: [phone] Mon-Fri 7:00 am-4:30 pm Sat-Sun 7:00 am-11:30 am

## 2016-05-28 NOTE — ED Provider Notes (Signed)
[facility] DEPT Provider Note   CSN: [id number] Arrival date & time: 05/28/16  1213     History   Chief Complaint Chief Complaint  Patient presents with  . level 1 trauma    HPI Francisco Johns is a 41 y.o. male.  HPI Patient presented as a level I trauma. Met in the trauma bay upon arrival by myself and Dr. Grandville Silos, from trauma surgery. Head atraumatic and rotation of his right hand at the wrist from a wood splitter. Tourniquet and placed by EMS. Hand is been on ice. Patient is otherwise healthy. No medicines or allergies. No previous surgeries. Unknown last tetanus.   History reviewed. No pertinent past medical history.  There are no active problems to display for this patient.   History reviewed. No pertinent surgical history.     Home Medications    Prior to Admission medications   Not on File    Family History No family history on file.  Social History Social History  Substance Use Topics  . Smoking status: Not on file  . Smokeless tobacco: Not on file  . Alcohol use Not on file     Allergies   Patient has no known allergies.   Review of Systems Review of Systems  Constitutional: Negative for appetite change and fever.  HENT: Negative for congestion.   Respiratory: Negative for shortness of breath.   Cardiovascular: Negative for chest pain.  Gastrointestinal: Negative for abdominal pain.  Genitourinary: Negative for flank pain.  Musculoskeletal: Negative for back pain.  Skin: Positive for wound.  Neurological: Positive for weakness and numbness.  Psychiatric/Behavioral: Negative for confusion.     Physical Exam Updated Vital Signs BP 127/97   Pulse 68   Temp 98 F (36.7 C)   Resp 20   Ht 5' 7"  (1.702 m)   Wt 172 lb (78 kg)   SpO2 99%   BMI 26.94 kg/m   Physical Exam  Constitutional: He appears well-developed.  HENT:  Head: Atraumatic.  Eyes: EOM are normal.  Cardiovascular: Normal rate.   Pulmonary/Chest: Effort normal.    Abdominal: Soft.  Musculoskeletal:  Dramatic agitation of right hand at the wrist. Tourniquet in place on forearm. No active bleeding. No tenderness at elbow or shoulder. Hand is with patient in his glove with cold saline in a bin.  Neurological: He is alert.  Skin: Skin is warm. Capillary refill takes less than 2 seconds.  Psychiatric: He has a normal mood and affect.     ED Treatments / Results  Labs (all labs ordered are listed, but only abnormal results are displayed) Labs Reviewed  I-STAT CHEM 8, ED - Abnormal; Notable for the following:       Result Value   BUN 22 (*)    Glucose, Bld 135 (*)    Calcium, Ion 1.07 (*)    All other components within normal limits  CBC WITH DIFFERENTIAL/PLATELET  CDS SEROLOGY  I-STAT CG4 LACTIC ACID, ED  TYPE AND SCREEN  PREPARE FRESH FROZEN PLASMA  ABO/RH    EKG  EKG Interpretation None       Radiology Dg Forearm Right  Result Date: 05/28/2016 CLINICAL DATA:  Amputation from machinery. EXAM: RIGHT FOREARM - 2 VIEW COMPARISON:  None. 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 Amputation of the distal right radius and ulna is noted, with bone fragments noted around the distal right ulna. IMPRESSION: Traumatic amputation as described above. Electronically Signed   By: Jeneen Rinks  Murlean Caller, M.D.   On: 05/28/2016 12:40   Dg Hand 2 View Right  Result Date: 05/28/2016 CLINICAL DATA:  Status post amputation from machinery. EXAM: RIGHT HAND - 2 VIEW COMPARISON:  None. FINDINGS: The right hand has been amputated from the right forearm. The distal portion including the hand includes portions of the distal right radius and ulna. IMPRESSION: Traumatic amputation of the right hand. Electronically Signed   By: Marijo Conception, M.D.   On: 05/28/2016 12:41    Procedures Procedures (including critical care time)  Medications Ordered in ED Medications  0.9 %  sodium chloride infusion (not administered)  fentaNYL (SUBLIMAZE)  injection 100 mcg (100 mcg Intravenous Given 05/28/16 1242)  fentaNYL (SUBLIMAZE) 100 MCG/2ML injection (not administered)  HYDROmorphone (DILAUDID) injection 1 mg (not administered)  fentaNYL (SUBLIMAZE) 100 MCG/2ML injection (100 mcg  Given 05/28/16 1226)  Tdap (BOOSTRIX) 5-2.5-18.5 LF-MCG/0.5 injection (0.5 mLs Intramuscular Given 05/28/16 1225)  ceFAZolin (ANCEF) 2-4 GM/100ML-% IVPB (2,000 mg  New Bag/Given 05/28/16 1225)  0.9 %  sodium chloride infusion (100 mL/hr Intravenous New Bag/Given 05/28/16 1224)     Initial Impression / Assessment and Plan / ED Course  I have reviewed the triage vital signs and the nursing notes.  Pertinent labs & imaging results that were available during my care of the patient were reviewed by me and considered in my medical decision making (see chart for details).    patient dramatic cavitation of right hand. Prior to arrival discussed with hand surgery here that states this will need transfer to [facility] for reimplantation. Discussed with Dr. Vernona Rieger at [facility] except the patient transfer. Taken emergently by [facility]. Tetanus updated and given Ancef.  CRITICAL CARE Performed by: Mackie Pai Total critical care time: 30 minutes Critical care time was exclusive of separately billable procedures and treating other patients. Critical care was necessary to treat or prevent imminent or life-threatening deterioration. Critical care was time spent personally by me on the following activities: development of treatment plan with patient and/or surrogate as well as nursing, discussions with consultants, evaluation of patient's response to treatment, examination of patient, obtaining history from patient or surrogate, ordering and performing treatments and interventions, ordering and review of laboratory studies, ordering and review of radiographic studies, pulse oximetry and re-evaluation of patient's condition.   Final Clinical Impressions(s) / ED Diagnoses    Final diagnoses:  Amputation of right hand, initial encounter [facility])    New Prescriptions New Prescriptions   No medications on file     Davonna Belling, MD 05/28/16 1544

## 2016-05-28 NOTE — Progress Notes (Signed)
Orthopedic Tech Progress Note Patient Details:  Francisco Johns [birth year] [id number]  Patient ID: Francisco Johns, male   DOB: 12-30-1975, 41 y.o.   MRN: [id number]   Francisco Johns 05/28/2016, 12:26 PMlevel one Trauma

## 2016-05-29 ENCOUNTER — Encounter (HOSPITAL_BASED_OUTPATIENT_CLINIC_OR_DEPARTMENT_OTHER): Payer: Self-pay | Admitting: Orthopedic Surgery

## 2016-05-29 DIAGNOSIS — S68411A Complete traumatic amputation of right hand at wrist level, initial encounter: Secondary | ICD-10-CM | POA: Insufficient documentation

## 2016-05-29 MED FILL — Fentanyl Citrate Preservative Free (PF) Inj 100 MCG/2ML: INTRAMUSCULAR | Qty: 2 | Status: AC

## 2016-06-04 MED FILL — oxyCODONE HCL 5 MG TABS: 5 | 5 days supply | Qty: 75 | Fill #0

## 2016-06-04 MED FILL — ENOXAPARIN 40 MG/0.4 ML SYR: 40 | 14 days supply | Qty: 6 | Fill #0

## 2016-06-04 MED FILL — CEPHALEXIN 500 MG CAPSULE: 500 | 7 days supply | Qty: 28 | Fill #0

## 2016-06-04 MED FILL — ASPIRIN EC 325 MG TABLET: 325 | 30 days supply | Qty: 30 | Fill #0

## 2016-06-04 MED FILL — GABAPENTIN 100 MG CAPSULE: 100 | 30 days supply | Qty: 90 | Fill #0

## 2016-06-07 DIAGNOSIS — Z9489 Other transplanted organ and tissue status: Secondary | ICD-10-CM | POA: Diagnosis not present

## 2016-06-07 DIAGNOSIS — S68411A Complete traumatic amputation of right hand at wrist level, initial encounter: Secondary | ICD-10-CM | POA: Diagnosis not present

## 2016-06-07 DIAGNOSIS — S68411D Complete traumatic amputation of right hand at wrist level, subsequent encounter: Secondary | ICD-10-CM | POA: Diagnosis not present

## 2016-06-18 DIAGNOSIS — S68411D Complete traumatic amputation of right hand at wrist level, subsequent encounter: Secondary | ICD-10-CM | POA: Diagnosis not present

## 2016-06-18 DIAGNOSIS — M25531 Pain in right wrist: Secondary | ICD-10-CM | POA: Diagnosis not present

## 2016-06-18 DIAGNOSIS — M6281 Muscle weakness (generalized): Secondary | ICD-10-CM | POA: Diagnosis not present

## 2016-06-18 DIAGNOSIS — S58111D Complete traumatic amputation at level between elbow and wrist, right arm, subsequent encounter: Secondary | ICD-10-CM | POA: Diagnosis not present

## 2016-06-20 DIAGNOSIS — S68411D Complete traumatic amputation of right hand at wrist level, subsequent encounter: Secondary | ICD-10-CM | POA: Diagnosis not present

## 2016-06-21 DIAGNOSIS — Z1283 Encounter for screening for malignant neoplasm of skin: Secondary | ICD-10-CM | POA: Diagnosis not present

## 2016-06-21 DIAGNOSIS — D225 Melanocytic nevi of trunk: Secondary | ICD-10-CM | POA: Diagnosis not present

## 2016-06-21 DIAGNOSIS — D2272 Melanocytic nevi of left lower limb, including hip: Secondary | ICD-10-CM | POA: Diagnosis not present

## 2016-06-21 DIAGNOSIS — D485 Neoplasm of uncertain behavior of skin: Secondary | ICD-10-CM | POA: Diagnosis not present

## 2016-06-21 DIAGNOSIS — H5213 Myopia, bilateral: Secondary | ICD-10-CM | POA: Diagnosis not present

## 2016-06-21 DIAGNOSIS — Z08 Encounter for follow-up examination after completed treatment for malignant neoplasm: Secondary | ICD-10-CM | POA: Diagnosis not present

## 2016-06-21 DIAGNOSIS — H31093 Other chorioretinal scars, bilateral: Secondary | ICD-10-CM | POA: Diagnosis not present

## 2016-06-21 DIAGNOSIS — Z85820 Personal history of malignant melanoma of skin: Secondary | ICD-10-CM | POA: Diagnosis not present

## 2016-06-22 DIAGNOSIS — S68411D Complete traumatic amputation of right hand at wrist level, subsequent encounter: Secondary | ICD-10-CM | POA: Diagnosis not present

## 2016-06-22 DIAGNOSIS — S58111D Complete traumatic amputation at level between elbow and wrist, right arm, subsequent encounter: Secondary | ICD-10-CM | POA: Diagnosis not present

## 2016-06-27 DIAGNOSIS — S58111D Complete traumatic amputation at level between elbow and wrist, right arm, subsequent encounter: Secondary | ICD-10-CM | POA: Diagnosis not present

## 2016-06-27 DIAGNOSIS — S68411D Complete traumatic amputation of right hand at wrist level, subsequent encounter: Secondary | ICD-10-CM | POA: Diagnosis not present

## 2016-07-02 DIAGNOSIS — S58111D Complete traumatic amputation at level between elbow and wrist, right arm, subsequent encounter: Secondary | ICD-10-CM | POA: Diagnosis not present

## 2016-07-02 DIAGNOSIS — S68411D Complete traumatic amputation of right hand at wrist level, subsequent encounter: Secondary | ICD-10-CM | POA: Diagnosis not present

## 2016-07-05 DIAGNOSIS — S58111D Complete traumatic amputation at level between elbow and wrist, right arm, subsequent encounter: Secondary | ICD-10-CM | POA: Diagnosis not present

## 2016-07-05 DIAGNOSIS — S68411D Complete traumatic amputation of right hand at wrist level, subsequent encounter: Secondary | ICD-10-CM | POA: Diagnosis not present

## 2016-07-10 DIAGNOSIS — S58111D Complete traumatic amputation at level between elbow and wrist, right arm, subsequent encounter: Secondary | ICD-10-CM | POA: Diagnosis not present

## 2016-07-10 DIAGNOSIS — S68411D Complete traumatic amputation of right hand at wrist level, subsequent encounter: Secondary | ICD-10-CM | POA: Diagnosis not present

## 2016-07-13 DIAGNOSIS — S58111D Complete traumatic amputation at level between elbow and wrist, right arm, subsequent encounter: Secondary | ICD-10-CM | POA: Diagnosis not present

## 2016-07-13 DIAGNOSIS — S68411D Complete traumatic amputation of right hand at wrist level, subsequent encounter: Secondary | ICD-10-CM | POA: Diagnosis not present

## 2016-07-16 DIAGNOSIS — S68411D Complete traumatic amputation of right hand at wrist level, subsequent encounter: Secondary | ICD-10-CM | POA: Diagnosis not present

## 2016-07-16 DIAGNOSIS — S58111D Complete traumatic amputation at level between elbow and wrist, right arm, subsequent encounter: Secondary | ICD-10-CM | POA: Diagnosis not present

## 2016-07-19 DIAGNOSIS — S58111D Complete traumatic amputation at level between elbow and wrist, right arm, subsequent encounter: Secondary | ICD-10-CM | POA: Diagnosis not present

## 2016-07-19 DIAGNOSIS — S68411D Complete traumatic amputation of right hand at wrist level, subsequent encounter: Secondary | ICD-10-CM | POA: Diagnosis not present

## 2016-07-23 DIAGNOSIS — S68411D Complete traumatic amputation of right hand at wrist level, subsequent encounter: Secondary | ICD-10-CM | POA: Diagnosis not present

## 2016-07-23 DIAGNOSIS — S58111D Complete traumatic amputation at level between elbow and wrist, right arm, subsequent encounter: Secondary | ICD-10-CM | POA: Diagnosis not present

## 2016-07-23 MED FILL — AMITRIPTYLINE HCL 25 MG TAB: 25 | 30 days supply | Qty: 30 | Fill #0

## 2016-07-26 DIAGNOSIS — S68411D Complete traumatic amputation of right hand at wrist level, subsequent encounter: Secondary | ICD-10-CM | POA: Diagnosis not present

## 2016-07-26 DIAGNOSIS — S58111D Complete traumatic amputation at level between elbow and wrist, right arm, subsequent encounter: Secondary | ICD-10-CM | POA: Diagnosis not present

## 2016-07-30 DIAGNOSIS — S68411D Complete traumatic amputation of right hand at wrist level, subsequent encounter: Secondary | ICD-10-CM | POA: Diagnosis not present

## 2016-07-30 DIAGNOSIS — S58111D Complete traumatic amputation at level between elbow and wrist, right arm, subsequent encounter: Secondary | ICD-10-CM | POA: Diagnosis not present

## 2016-08-02 DIAGNOSIS — S58111D Complete traumatic amputation at level between elbow and wrist, right arm, subsequent encounter: Secondary | ICD-10-CM | POA: Diagnosis not present

## 2016-08-02 DIAGNOSIS — S68411D Complete traumatic amputation of right hand at wrist level, subsequent encounter: Secondary | ICD-10-CM | POA: Diagnosis not present

## 2016-08-06 DIAGNOSIS — S58111D Complete traumatic amputation at level between elbow and wrist, right arm, subsequent encounter: Secondary | ICD-10-CM | POA: Diagnosis not present

## 2016-08-06 DIAGNOSIS — S68411D Complete traumatic amputation of right hand at wrist level, subsequent encounter: Secondary | ICD-10-CM | POA: Diagnosis not present

## 2016-08-09 DIAGNOSIS — Z4789 Encounter for other orthopedic aftercare: Secondary | ICD-10-CM | POA: Diagnosis not present

## 2016-08-09 DIAGNOSIS — S68411D Complete traumatic amputation of right hand at wrist level, subsequent encounter: Secondary | ICD-10-CM | POA: Diagnosis not present

## 2016-08-09 DIAGNOSIS — S58111D Complete traumatic amputation at level between elbow and wrist, right arm, subsequent encounter: Secondary | ICD-10-CM | POA: Diagnosis not present

## 2016-08-13 DIAGNOSIS — S58111D Complete traumatic amputation at level between elbow and wrist, right arm, subsequent encounter: Secondary | ICD-10-CM | POA: Diagnosis not present

## 2016-08-13 DIAGNOSIS — S68411D Complete traumatic amputation of right hand at wrist level, subsequent encounter: Secondary | ICD-10-CM | POA: Diagnosis not present

## 2016-08-16 DIAGNOSIS — S58111D Complete traumatic amputation at level between elbow and wrist, right arm, subsequent encounter: Secondary | ICD-10-CM | POA: Diagnosis not present

## 2016-08-16 DIAGNOSIS — S68411D Complete traumatic amputation of right hand at wrist level, subsequent encounter: Secondary | ICD-10-CM | POA: Diagnosis not present

## 2016-08-21 ENCOUNTER — Ambulatory Visit: Payer: 59 | Attending: Plastic Surgery | Admitting: Occupational Therapy

## 2016-08-21 DIAGNOSIS — M79601 Pain in right arm: Secondary | ICD-10-CM | POA: Diagnosis not present

## 2016-08-21 DIAGNOSIS — M25631 Stiffness of right wrist, not elsewhere classified: Secondary | ICD-10-CM

## 2016-08-21 DIAGNOSIS — M6281 Muscle weakness (generalized): Secondary | ICD-10-CM

## 2016-08-21 DIAGNOSIS — M25641 Stiffness of right hand, not elsewhere classified: Secondary | ICD-10-CM | POA: Diagnosis not present

## 2016-08-21 DIAGNOSIS — R208 Other disturbances of skin sensation: Secondary | ICD-10-CM

## 2016-08-21 DIAGNOSIS — L905 Scar conditions and fibrosis of skin: Secondary | ICD-10-CM | POA: Diagnosis not present

## 2016-08-21 NOTE — Therapy (Signed)
[place] [address], [place], [address] Phone: [phone]   Fax:  [phone]  Occupational Therapy Evaluation  Patient Details  Name: Francisco Johns MRN: [id number] Date of Birth: 05-29-1975 Referring Provider: Vernona Rieger  Encounter Date: 08/21/2016      OT End of Session - 08/21/16 1415    Visit Number 1   Number of Visits 24   Date for OT Re-Evaluation 11/21/16   OT Start Time 0837   OT Stop Time 1022   OT Time Calculation (min) 105 min   Activity Tolerance Patient tolerated treatment well   Behavior During Therapy [facility] for tasks assessed/performed      Past Medical History:  Diagnosis Date  . Metatarsal fracture    right big toe    Past Surgical History:  Procedure Laterality Date  . ankle surgery    . NO PAST SURGERIES    . ORIF TOE FRACTURE Right 12/09/2015   Procedure: OPEN REDUCTION INTERNAL FIXATION (ORIF) METATARSAL (TOE) FRACTURE, first metatarsal;  Surgeon: Renette Butters, MD;  Location: [place];  Service: Orthopedics;  Laterality: Right;    There were no vitals filed for this visit.      Subjective Assessment - 08/21/16 1350    Subjective  My hand got pulled into log splitter - I had my first surgery on Febr 5th - few hours after my accident - I had therapy for the last few wks in [place] - and now transfering to you - my wife work at [employer] and  I had limited visits with her in [place] - doing okay - still numb my hand , a lot of scar tissue at wrist ,  and very little movement in fingers - I am seeing the surgeon tomorrow    Patient Stated Goals I want as much movement and use I can get for my R dominant hand- to work , play with my kids, fish , play golf ,   Currently in Pain? Yes   Pain Score 3    Pain Location Hand  Wrist   Pain Orientation Right   Pain Descriptors / Indicators Aching;Tingling;Numbness;Shooting   Pain Type Surgical pain   Pain Onset  More than a month ago           [facility] OT Assessment - 08/21/16 0001      Assessment   Diagnosis R hand amputation    Referring Provider molnar   Onset Date 05/28/16   Prior Therapy OT in [facility]     Precautions   Required Braces or Orthoses --  Multiple splints - sleep with dorsal block forearm splint ,      Home  Environment   Lives With Family     Prior Function   Vocation Full time employment   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,  R hand dominant, likes to play with 2 and 53 yr old sons,  play golf, fish      AROM   Right Forearm Pronation 40 Degrees   Right Forearm Supination 60 Degrees   Right Wrist Extension 2 Degrees  PROM 37   Right Wrist Flexion 46 Degrees  PROM 65   Right Wrist Radial Deviation 0 Degrees   Right Wrist Ulnar Deviation 15 Degrees   Left Wrist Extension 65 Degrees   Left Wrist Flexion 90 Degrees   Left Wrist Radial Deviation 18 Degrees   Left Wrist Ulnar Deviation 40 Degrees  Right Hand AROM   R Thumb IP 0-80 --  Kept in thumb IP flexion - tight   R Thumb Radial ABduction/ADduction 0-55 --  Some AROM    R Thumb Palmar ABduction/ADduction 0-45 --  NO AROM    R Index  MCP 0-90 --  10 to 30   R Index PIP 0-100 --  35 to 70   R Long  MCP 0-90 --  10 to 30   R Long PIP 0-100 --  35 to 60   R Ring  MCP 0-90 --  10 to 30   R Ring PIP 0-100 --  35 to 70   R Little  MCP 0-90 --  15 to 30   R Little PIP 0-100 --  30 to 45       In shower - PROM for MC flexion  PIP and DIP flexion  Composite flexion - focus on 4th and 5th  10 reps  Massage in webspace - and PROM for PA of thumb , composite extention stretch for RA of thumb including IP   Prayer stretch infront of him - hold 10 sec do 10  AROM on table for wrist extention - after placing into flexion  Place and hold for extention - over edge of table - partial range  10 reps  AROM for sup and pronation   Splint wearing - wrist extention  dynamic splint - to wear for 2 hrs - not  do flexion against resistance bands the whole time Finger flexion dynamic splint - for PIP and MC flexion  But maybe will extend bands for [facility] flexoin one   isotoner glove for night time                     OT Education - 08/21/16 1415    Education provided Yes   Education Details Nerve healing, HEP updated and splint wearing    Person(s) Educated Patient   Methods Explanation;Demonstration;Tactile cues;Verbal cues;Handout   Comprehension Returned demonstration;Verbalized understanding          OT Short Term Goals - 08/21/16 1433      OT SHORT TERM GOAL #1   Title Pt to be ind in HEP to use splints correctly , increase ROM and sensatin in R hand and wrist    Time 3   Period Weeks   Status New     OT SHORT TERM GOAL #2   Title Fabricate/ modify or change splints as needed to prevent contractures in R hand and wrist while nerve healing occur   Baseline Webspace getting tight , IP of thumb flexor contracture -    Time 3   Period Weeks   Status New     OT SHORT TERM GOAL #3   Title R wrist AROM and PROM improve with 10-15 degrees to be able carry object on palm, wipe table and reach with wrist extention to grip    Baseline RD 0, Ext 2, UD 15 flexion 46, pronation 40, Sup 60   Time 4   Period Weeks   Status New     OT SHORT TERM GOAL #4   Title Sensation improve in R hand to identify  ojbects in palm 50% of time    Baseline Tinel at base of thumb and DPC , MC - did not do Semmes weinstein - time contrain   Time 4   Period Weeks   Status New  OT Long Term Goals - 08/21/16 1437      OT LONG TERM GOAL #1   Title Upgrade HEP to increase PIP and MC flexion as nerve is healing to grasp 6 inch object to 3 inch object   Baseline see flowsheet    Time 6   Period Weeks   Status New     OT LONG TERM GOAL #2   Title Wrist ROM improve and strength for pt to intiate using 1 lbs weight for wrist in all planes    Baseline UD without gravity , 0 RD,  wrist extention partial range against gravity , pron and sup 40 and 60 degrees - no strengthening    Time 8   Period Weeks   Status New     OT LONG TERM GOAL #3   Title Assess shoulder and upgrade HEP to decrease pain and compensation    Time 4   Period Weeks   Status New               Plan - 08/21/16 1416    Clinical Impression Statement Pt present 12 wks s/p from Replantation of the right hand including; repair of radial and ulnar artery,  Repair of the flexor pollicis longus, repair of the flexor carpi ulnaris, repair the flexor carpi radialis ; Repair of flexor digitorum profundus x4,  Repair of the flexor digitorum superficialis x4  Repair of extensor digitorum communis x4, Repair of the extensor pollicis longus, extensor carpi radialis longus, extensor carpi radialis brevis, extensor carpi ulnaris;Repair of the tendons x2, of the first dorsal compartment  - had ulnar , medial and radial Nerve repaired  - pt present at eval with good cirulation - Positive Tinel for medial Nerve at base of thumb , ulnar N at [facility] and radial N at [facility] - pt show decrease sensation from wrist level distal, show increase  tightness in 4th and 5th digit PROM more than 2nd and 3rd , decrease wrist RD and extention more than UD and flxion - thick scar tissue at volar wrist graft site , decrease supinatin and pronation -   has some AROM in PIP's and MC's , thumb RA - see flowsheet - pt has several splints - forearm dorsal block splint for night time with scar mold daytime - and during day alternate every 2 hrs - dynamic flexion and extention splints for digits , and dynamic wrist extention splint - both forearm base- pt report he did receive hand base short thumb spica last week and anti ulnar N splint - pt show decrease ROM in all joints and all planes in hand and wrist/forearm  , will benefit from nerve and sensory reed , scar mobs , pt do show some shoulder weakness and pain that will be reassess further , splint  modifications and changes as needed - to prevne contracture as nerve healing occur and ROM coming back - strenghtening    Rehab Potential Good   OT Frequency 2x / week   OT Duration 12 weeks   OT Treatment/Interventions Self-care/ADL training;Fluidtherapy;Splinting;Patient/family education;Therapeutic exercises;Ultrasound;Scar mobilization;Passive range of motion;Neuromuscular education;Electrical Stimulation;Manual Therapy   Plan results from MD visit - if splint changes for night time and webspacer    OT Home Exercise Plan see pt instruction   Consulted and Agree with Plan of Care Patient      Patient will benefit from skilled therapeutic intervention in order to improve the following deficits and impairments:  Decreased range of motion, Impaired flexibility, Decreased coordination,  Decreased safety awareness, Increased edema, Impaired sensation, Decreased skin integrity, Decreased knowledge of precautions, Decreased knowledge of use of DME, Decreased scar mobility, Impaired UE functional use, Pain, Decreased strength, Impaired perceived functional ability  Visit Diagnosis: Stiffness of right hand, not elsewhere classified - Plan: Ot plan of care cert/re-cert  Stiffness of right wrist, not elsewhere classified - Plan: Ot plan of care cert/re-cert  Pain in right arm - Plan: Ot plan of care cert/re-cert  Other disturbances of skin sensation - Plan: Ot plan of care cert/re-cert  Scar condition and fibrosis of skin - Plan: Ot plan of care cert/re-cert  Muscle weakness (generalized) - Plan: Ot plan of care cert/re-cert    Problem List There are no active problems to display for this patient.   Rosalyn Gess OTR/L,CLT 08/21/2016, 2:52 PM  [facility] [facility] [address] [place], [address] Phone: [phone]   Fax:  [phone]  Name: Francisco Johns MRN: [id number] Date of Birth: 01-25-1976

## 2016-08-21 NOTE — Patient Instructions (Signed)
In shower - PROM for MC flexion  PIP and DIP flexion  Composite flexion - focus on 4th and 5th  10 reps  Massage in webspace - and PROM for PA of thumb , composite extention stretch for RA of thumb including IP   Prayer stretch infront of him - hold 10 sec do 10  AROM on table for wrist extention - after placing into flexion  Place and hold for extention - over edge of table - partial range  10 reps  AROM for sup and pronation   Splint wearing - wrist extention  dynamic splint - to wear for 2 hrs - not do flexion against resistance bands the whole time Finger flexion dynamic splint - for PIP and MC flexion  But maybe will extend bands for [facility] flexoin one   isotoner glove for night time

## 2016-08-22 MED FILL — GABAPENTIN 300 MG CAPSULE: 300 | 30 days supply | Qty: 90 | Fill #0

## 2016-08-23 ENCOUNTER — Ambulatory Visit: Payer: 59 | Admitting: Occupational Therapy

## 2016-08-23 DIAGNOSIS — M25641 Stiffness of right hand, not elsewhere classified: Secondary | ICD-10-CM

## 2016-08-23 DIAGNOSIS — M79601 Pain in right arm: Secondary | ICD-10-CM

## 2016-08-23 DIAGNOSIS — R208 Other disturbances of skin sensation: Secondary | ICD-10-CM | POA: Diagnosis not present

## 2016-08-23 DIAGNOSIS — L905 Scar conditions and fibrosis of skin: Secondary | ICD-10-CM

## 2016-08-23 DIAGNOSIS — M6281 Muscle weakness (generalized): Secondary | ICD-10-CM | POA: Diagnosis not present

## 2016-08-23 DIAGNOSIS — M25631 Stiffness of right wrist, not elsewhere classified: Secondary | ICD-10-CM | POA: Diagnosis not present

## 2016-08-23 NOTE — Patient Instructions (Signed)
Cont with same HEP as provided last session  Reinforce doing correctly composite flexion of 4th and 5th   PROM wrist extention  Fabricated webspacer to use during day for few hours at time - alternate with dynamic splints  Foam piece in dorsal block behind 4th and 5th to increase MC flexion to 90 degrees for night time

## 2016-08-23 NOTE — Therapy (Signed)
[place] [facility] [address] [place], [address] Phone: [phone]   Fax:  [phone]  Occupational Therapy Treatment  Patient Details  Name: Francisco Johns MRN: [id number] Date of Birth: Jul 03, 1975 Referring Provider: Vernona Rieger  Encounter Date: 08/23/2016      OT End of Session - 08/23/16 2003    Visit Number 3   Number of Visits 24   Date for OT Re-Evaluation 11/21/16   OT Start Time 0802   OT Stop Time 0900   OT Time Calculation (min) 58 min   Activity Tolerance Patient tolerated treatment well   Behavior During Therapy [facility] for tasks assessed/performed      Past Medical History:  Diagnosis Date  . Metatarsal fracture    right big toe    Past Surgical History:  Procedure Laterality Date  . ankle surgery    . NO PAST SURGERIES    . ORIF TOE FRACTURE Right 12/09/2015   Procedure: OPEN REDUCTION INTERNAL FIXATION (ORIF) METATARSAL (TOE) FRACTURE, first metatarsal;  Surgeon: Renette Butters, MD;  Location: [place];  Service: Orthopedics;  Laterality: Right;    There were no vitals filed for this visit.      Subjective Assessment - 08/23/16 1952    Subjective  Seen Dr [place] Rieger - did not know about any rupture nerves after surgery - Tinel on all 3 - go back in 4 wks - seen ortho tomorrow- still nerve pain - brought the splints    Patient Stated Goals I want as much movement and use I can get for my R dominant hand- to work , play with my kids, fish , play golf ,   Currently in Pain? Yes   Pain Score 3    Pain Location Hand   Pain Orientation Right   Pain Descriptors / Indicators Aching;Pins and needles;Tingling   Pain Type Surgical pain   Pain Onset More than a month ago                      OT Treatments/Exercises (OP) - 08/23/16 0001      Moist Heat Therapy   Number Minutes Moist Heat 8 Minutes   Moist Heat Location Hand  open hand 4 min and then loose fist 4  min      composite flexion of all digits  After [facility] and intrinsic fist  All PROM  Attempted place and hold around object of 6 3/8 inch  Provided to work on at home   Modify dorsal block splint - foam piece firm behind proximal phalanges of 4th and 5th to increase MC flexion at night time  Remove palmar block Fabricated webspacer to use during day few times fr hour or 2  in partial PA and thumb IP in extention    PROM for wrist extention at edge of table  Scar mobs done around scar   E-stim done for neure reed - 2 x 2 cm patches on volar forearm distal to graft - with wrist and digits flexion  8 min - 6 sec on - pt assist 3 of that 6 sec              OT Education - 08/23/16 2002    Education provided Yes   Education Details HEP, splint adjustments,    Person(s) Educated Patient   Methods Explanation;Demonstration;Tactile cues;Verbal cues   Comprehension Verbal cues required;Returned demonstration;Verbalized understanding  OT Short Term Goals - 08/21/16 1433      OT SHORT TERM GOAL #1   Title Pt to be ind in HEP to use splints correctly , increase ROM and sensatin in R hand and wrist    Time 3   Period Weeks   Status New     OT SHORT TERM GOAL #2   Title Fabricate/ modify or change splints as needed to prevent contractures in R hand and wrist while nerve healing occur   Baseline Webspace getting tight , IP of thumb flexor contracture -    Time 3   Period Weeks   Status New     OT SHORT TERM GOAL #3   Title R wrist AROM and PROM improve with 10-15 degrees to be able carry object on palm, wipe table and reach with wrist extention to grip    Baseline RD 0, Ext 2, UD 15 flexion 46, pronation 40, Sup 60   Time 4   Period Weeks   Status New     OT SHORT TERM GOAL #4   Title Sensation improve in R hand to identify  ojbects in palm 50% of time    Baseline Tinel at base of thumb and DPC , MC - did not do Semmes weinstein - time contrain   Time 4   Period  Weeks   Status New           OT Long Term Goals - 08/21/16 1437      OT LONG TERM GOAL #1   Title Upgrade HEP to increase PIP and MC flexion as nerve is healing to grasp 6 inch object to 3 inch object   Baseline see flowsheet    Time 6   Period Weeks   Status New     OT LONG TERM GOAL #2   Title Wrist ROM improve and strength for pt to intiate using 1 lbs weight for wrist in all planes    Baseline UD without gravity , 0 RD, wrist extention partial range against gravity , pron and sup 40 and 60 degrees - no strengthening    Time 8   Period Weeks   Status New     OT LONG TERM GOAL #3   Title Assess shoulder and upgrade HEP to decrease pain and compensation    Time 4   Period Weeks   Status New               Plan - 08/23/16 2003    Clinical Impression Statement Pt return for 2nd appt - pt did ask plastic surgeon about if one of the nerves is rupture since surgery - per his wife and previous therapist - pt brought precription - 3 Tinels at [facility] - can make cockup with 30 degrees extention , webspace - and go up to 2 lbs in weight - pt very motivated    Rehab Potential Good   OT Frequency 2x / week   OT Duration 12 weeks   OT Treatment/Interventions Self-care/ADL training;Fluidtherapy;Splinting;Patient/family education;Therapeutic exercises;Ultrasound;Scar mobilization;Passive range of motion;Neuromuscular education;Electrical Stimulation;Manual Therapy   Plan results from ortho MD - splint wearing - progress in 4th and 5ht diigt   OT Home Exercise Plan see pt instruction   Consulted and Agree with Plan of Care Patient      Patient will benefit from skilled therapeutic intervention in order to improve the following deficits and impairments:  Decreased range of motion, Impaired flexibility, Decreased coordination, Decreased safety awareness, Increased edema, Impaired  sensation, Decreased skin integrity, Decreased knowledge of precautions, Decreased knowledge of use of DME,  Decreased scar mobility, Impaired UE functional use, Pain, Decreased strength, Impaired perceived functional ability  Visit Diagnosis: Stiffness of right hand, not elsewhere classified  Stiffness of right wrist, not elsewhere classified  Pain in right arm  Other disturbances of skin sensation  Scar condition and fibrosis of skin  Muscle weakness (generalized)    Problem List There are no active problems to display for this patient.   Rosalyn Gess OTR/L,CLT 08/23/2016, 8:08 PM  [facility] [address], [place], [address] Phone: [phone]   Fax:  [phone]  Name: JANICE SEALES MRN: [id number] Date of Birth: 12-10-1975

## 2016-08-24 DIAGNOSIS — Z9889 Other specified postprocedural states: Secondary | ICD-10-CM | POA: Diagnosis not present

## 2016-08-24 DIAGNOSIS — S58111D Complete traumatic amputation at level between elbow and wrist, right arm, subsequent encounter: Secondary | ICD-10-CM | POA: Diagnosis not present

## 2016-08-24 DIAGNOSIS — Z4789 Encounter for other orthopedic aftercare: Secondary | ICD-10-CM | POA: Diagnosis not present

## 2016-08-27 ENCOUNTER — Ambulatory Visit: Payer: 59 | Admitting: Occupational Therapy

## 2016-08-27 DIAGNOSIS — L905 Scar conditions and fibrosis of skin: Secondary | ICD-10-CM | POA: Diagnosis not present

## 2016-08-27 DIAGNOSIS — M25631 Stiffness of right wrist, not elsewhere classified: Secondary | ICD-10-CM

## 2016-08-27 DIAGNOSIS — M25641 Stiffness of right hand, not elsewhere classified: Secondary | ICD-10-CM | POA: Diagnosis not present

## 2016-08-27 DIAGNOSIS — M6281 Muscle weakness (generalized): Secondary | ICD-10-CM | POA: Diagnosis not present

## 2016-08-27 DIAGNOSIS — R208 Other disturbances of skin sensation: Secondary | ICD-10-CM

## 2016-08-27 DIAGNOSIS — M79601 Pain in right arm: Secondary | ICD-10-CM | POA: Diagnosis not present

## 2016-08-27 NOTE — Therapy (Signed)
[place] [address], [place], [address] Phone: [phone]   Fax:  [phone]  Occupational Therapy Treatment  Patient Details  Name: Francisco Johns MRN: [id number] Date of Birth: [birth year] Referring Provider: [facility]ona Rieger  Encounter Date: 08/27/2016      OT End of Session - 08/27/16 1946    Visit Number 4   Number of Visits 24   Date for OT Re-Evaluation 11/21/16   OT Start Time 1147   OT Stop Time 1259   OT Time Calculation (min) 72 min   Activity Tolerance Patient tolerated treatment well   Behavior During Therapy [facility] for tasks assessed/performed      Past Medical History:  Diagnosis Date  . Metatarsal fracture    right big toe    Past Surgical History:  Procedure Laterality Date  . ankle surgery    . NO PAST SURGERIES    . ORIF TOE FRACTURE Right 12/09/2015   Procedure: OPEN REDUCTION INTERNAL FIXATION (ORIF) METATARSAL (TOE) FRACTURE, first metatarsal;  Surgeon: Renette Butters, MD;  Location: [place];  Service: Orthopedics;  Laterality: Right;    There were no vitals filed for this visit.      Subjective Assessment - 08/27/16 1931    Subjective  Seen Ortho MD - said on ulnar side it is bone fragments , and then could not see detail on radial side on xray - but every thing appear intact - did my execises but pinkie getting tight    Patient Stated Goals I want as much movement and use I can get for my R dominant hand- to work , play with my kids, fish , play golf ,   Currently in Pain? Yes   Pain Score 3    Pain Location Hand   Pain Orientation Right   Pain Descriptors / Indicators Pins and needles   Pain Type Surgical pain   Pain Onset More than a month ago                      OT Treatments/Exercises (OP) - 08/27/16 0001      Moist Heat Therapy   Number Minutes Moist Heat 10 Minutes   Moist Heat Location --  forearm in pronation and intrinsic  fist stretch      Assess PROM flexion of digits  4th and 5th tight   Heat done - see flowsheet PROM composite flexion of all digits - 2 x 10 reps   Scar massage done with coban - no friction - but around scar - scar mobs - provided for use at home  Ed on precautions  cica scar pad provided to use 3 x during day for 2 hrs - assess skin   PROM RD  Followed by 1 lbs bilateral RD but to easy - increase to 2 lbs ball - able to do  Add to HEP  PROM for pronation and sup  Place and hold pronation /sup 10 reps  strap around hand - 1 lbs for sup/pro - but support on leg for pronation - to decrease wrist flexion  Place and hold wrist extention -after PROM prayer stretch  unsupervised neure reed done - see flows sheet at end of session - done on wrist and forearm extensors - 10 min - protocol done for wrist and hand neuro reed   Add 2 small rubber bands  to dynamic splint for 2nd and 3rd - and one  for 4th and 5th -  To be able to have more ease with  place and hold [place] extention  To do 3 x every 30 min 10 reps                 OT Education - 08/27/16 1946    Education provided Yes   Education Details PROM and AROM wrist - using 1 or 2 lbs - and splint adjustments    Person(s) Educated Patient   Methods Explanation;Demonstration;Verbal cues;Handout;Tactile cues   Comprehension Verbal cues required;Returned demonstration;Verbalized understanding          OT Short Term Goals - 08/21/16 1433      OT SHORT TERM GOAL #1   Title Pt to be ind in HEP to use splints correctly , increase ROM and sensatin in R hand and wrist    Time 3   Period Weeks   Status New     OT SHORT TERM GOAL #2   Title Fabricate/ modify or change splints as needed to prevent contractures in R hand and wrist while nerve healing occur   Baseline Webspace getting tight , IP of thumb flexor contracture -    Time 3   Period Weeks   Status New     OT SHORT TERM GOAL #3   Title R wrist AROM and PROM improve  with 10-15 degrees to be able carry object on palm, wipe table and reach with wrist extention to grip    Baseline RD 0, Ext 2, UD 15 flexion 46, pronation 40, Sup 60   Time 4   Period Weeks   Status New     OT SHORT TERM GOAL #4   Title Sensation improve in R hand to identify  ojbects in palm 50% of time    Baseline Tinel at base of thumb and DPC , MC - did not do Semmes weinstein - time contrain   Time 4   Period Weeks   Status New           OT Long Term Goals - 08/21/16 1437      OT LONG TERM GOAL #1   Title Upgrade HEP to increase PIP and MC flexion as nerve is healing to grasp 6 inch object to 3 inch object   Baseline see flowsheet    Time 6   Period Weeks   Status New     OT LONG TERM GOAL #2   Title Wrist ROM improve and strength for pt to intiate using 1 lbs weight for wrist in all planes    Baseline UD without gravity , 0 RD, wrist extention partial range against gravity , pron and sup 40 and 60 degrees - no strengthening    Time 8   Period Weeks   Status New     OT LONG TERM GOAL #3   Title Assess shoulder and upgrade HEP to decrease pain and compensation    Time 4   Period Weeks   Status New               Plan - 08/27/16 1947    Clinical Impression Statement Pt show increase wrist AROM strength able to do 1 to 2 lbs for wrist this date - getting tight in 4th and 5th composite flexion - scar adhesions on volar wrist - cont to increase PROM , ROM - prevent contractures, scar mobs, and neuro reed   Rehab Potential Good   OT Frequency 2x / week   OT  Duration 12 weeks   OT Treatment/Interventions Self-care/ADL training;Fluidtherapy;Splinting;Patient/family education;Therapeutic exercises;Ultrasound;Scar mobilization;Passive range of motion;Neuromuscular education;Electrical Stimulation;Manual Therapy   Plan cockup splint- PROM , strengthening , scar mobs , AROM    OT Home Exercise Plan see pt instruction   Consulted and Agree with Plan of Care Patient       Patient will benefit from skilled therapeutic intervention in order to improve the following deficits and impairments:  Decreased range of motion, Impaired flexibility, Decreased coordination, Decreased safety awareness, Increased edema, Impaired sensation, Decreased skin integrity, Decreased knowledge of precautions, Decreased knowledge of use of DME, Decreased scar mobility, Impaired UE functional use, Pain, Decreased strength, Impaired perceived functional ability  Visit Diagnosis: Stiffness of right hand, not elsewhere classified  Stiffness of right wrist, not elsewhere classified  Pain in right arm  Other disturbances of skin sensation  Scar condition and fibrosis of skin  Muscle weakness (generalized)    Problem List There are no active problems to display for this patient.   Rosalyn Gess OTR/L,CLT 08/27/2016, 7:52 PM  [place] [facility] [address] [place], [address] Phone: [phone]   Fax:  [phone]  Name: Francisco Johns MRN: [id number] Date of Birth: May 07, 1975

## 2016-08-27 NOTE — Patient Instructions (Signed)
HEP focus on  PROM composite flexion of all digits   Scar massage using coban - but no friction  Ed on precautions  cica scar pad provided to use 3 x during day for 2 hrs - assess skin   PROM RD  Followed by 1 lbs bilateral RD but to easy - increase to 2 lbs ball - able to do  Add to HEP  PROM for pronation and sup  Place and hold pronation /sup 10 reps  strap around hand - 1 lbs for sup/pro - but support on leg for pronation - to decrease wrist flexion  Place and hold wrist extention -after PROM prayer stretch    Add 2 small rubber bands  to dynamic splint for 2nd and 3rd - and one for 4th and 5th -  To be able to have more ease with  place and hold MC extention  To do 3 x every 30 min 10 reps

## 2016-08-30 ENCOUNTER — Ambulatory Visit: Payer: 59 | Admitting: Occupational Therapy

## 2016-08-30 DIAGNOSIS — M25641 Stiffness of right hand, not elsewhere classified: Secondary | ICD-10-CM

## 2016-08-30 DIAGNOSIS — L905 Scar conditions and fibrosis of skin: Secondary | ICD-10-CM

## 2016-08-30 DIAGNOSIS — M79601 Pain in right arm: Secondary | ICD-10-CM

## 2016-08-30 DIAGNOSIS — R208 Other disturbances of skin sensation: Secondary | ICD-10-CM

## 2016-08-30 DIAGNOSIS — M25631 Stiffness of right wrist, not elsewhere classified: Secondary | ICD-10-CM | POA: Diagnosis not present

## 2016-08-30 DIAGNOSIS — M6281 Muscle weakness (generalized): Secondary | ICD-10-CM | POA: Diagnosis not present

## 2016-08-30 NOTE — Patient Instructions (Addendum)
  cica scar pad Wrist volar wrist splint and webspacer for  Night time use And wrist splint during day   Use benik neoprene  Splint  For sup and pronation - using 1 lbs weight with strap use Cont with PROM and place and hold - for digits and wrist in all planes

## 2016-08-30 NOTE — Therapy (Signed)
[place] [facility] [address], [place], [address] Phone: [phone]   Fax:  [phone]  Occupational Therapy Treatment  Patient Details  Name: Francisco Johns MRN: [id number] Date of Birth: 1976/04/03 Referring Provider: Vernona Rieger  Encounter Date: 08/30/2016      OT End of Session - 08/30/16 1851    Visit Number 5   Number of Visits 24   Date for OT Re-Evaluation 11/21/16   OT Start Time 0945   OT Stop Time 1055   OT Time Calculation (min) 70 min   Activity Tolerance Patient tolerated treatment well   Behavior During Therapy [facility] for tasks assessed/performed      Past Medical History:  Diagnosis Date  . Metatarsal fracture    right big toe    Past Surgical History:  Procedure Laterality Date  . ankle surgery    . NO PAST SURGERIES    . ORIF TOE FRACTURE Right 12/09/2015   Procedure: OPEN REDUCTION INTERNAL FIXATION (ORIF) METATARSAL (TOE) FRACTURE, first metatarsal;  Surgeon: Renette Butters, MD;  Location: [place];  Service: Orthopedics;  Laterality: Right;    There were no vitals filed for this visit.      Subjective Assessment - 08/30/16 1846    Subjective  Doing okay, I did the scarpad- and I like it - it keeps scar down -Can I wear it tonight -    Patient Stated Goals I want as much movement and use I can get for my R dominant hand- to work , play with my kids, fish , play golf ,   Currently in Pain? No/denies                      OT Treatments/Exercises (OP) - 08/30/16 0001      Moist Heat Therapy   Number Minutes Moist Heat 10 Minutes   Moist Heat Location --  hand in fist and into pronation stretch at [facility] Location 8   Electrical Stimulation Action extention digits and wrist   Electrical Stimulation Parameters protocol for wrist neuro reed, - 20 current   Electrical Stimulation Goals Neuromuscular  facilitation;Strength    Heatpad used for digits flexion and pronation stretch 8 min  Gentle PROM for pronation and supation  BTE 0 lbs for sup and pronation ( used Benik soft neoprene) - to use at home during HEP Needed min A and place and hold for pronation  120 Sec each  Wrist extention done 200 Sec on CPM on BTE  Wrist extention rolling over 2 kg ball - and AROM for maintaining wrist ext in neutral  20 reps Fabricated wrist extention splint at 30 degrees  To wear at night time and during day in combination with webspacer   Ed on precautions of wearing splint PROM for all digits flexion  Attempt place and hold- PROM for thumb PA and RA- composite extention including IP  Estim done on extensor mass and dorsal wrist distal to scar - incombination with wrist extention rolling over ball               OT Education - 08/30/16 1849    Education provided Yes   Education Details update HEP for AROM and PROM for wrist - and volar wrist splint for night time incombination with thumb webspacer   Person(s) Educated Patient   Methods Explanation;Demonstration;Tactile cues;Verbal cues   Comprehension  Verbal cues required;Returned demonstration;Verbalized understanding          OT Short Term Goals - 08/21/16 1433      OT SHORT TERM GOAL #1   Title Pt to be ind in HEP to use splints correctly , increase ROM and sensatin in R hand and wrist    Time 3   Period Weeks   Status New     OT SHORT TERM GOAL #2   Title Fabricate/ modify or change splints as needed to prevent contractures in R hand and wrist while nerve healing occur   Baseline Webspace getting tight , IP of thumb flexor contracture -    Time 3   Period Weeks   Status New     OT SHORT TERM GOAL #3   Title R wrist AROM and PROM improve with 10-15 degrees to be able carry object on palm, wipe table and reach with wrist extention to grip    Baseline RD 0, Ext 2, UD 15 flexion 46, pronation 40, Sup 60   Time 4   Period  Weeks   Status New     OT SHORT TERM GOAL #4   Title Sensation improve in R hand to identify  ojbects in palm 50% of time    Baseline Tinel at base of thumb and DPC , MC - did not do Semmes weinstein - time contrain   Time 4   Period Weeks   Status New           OT Long Term Goals - 08/21/16 1437      OT LONG TERM GOAL #1   Title Upgrade HEP to increase PIP and MC flexion as nerve is healing to grasp 6 inch object to 3 inch object   Baseline see flowsheet    Time 6   Period Weeks   Status New     OT LONG TERM GOAL #2   Title Wrist ROM improve and strength for pt to intiate using 1 lbs weight for wrist in all planes    Baseline UD without gravity , 0 RD, wrist extention partial range against gravity , pron and sup 40 and 60 degrees - no strengthening    Time 8   Period Weeks   Status New     OT LONG TERM GOAL #3   Title Assess shoulder and upgrade HEP to decrease pain and compensation    Time 4   Period Weeks   Status New               Plan - 08/30/16 1852    Clinical Impression Statement Pt showing increase ROM in wrist RD,UD , sup/pro ,Flexion and extention - able to starting gentle strengthening - digits and thumb AROM still and PROM decrease - cont to increase ROM , and prevent contractures - decrease scar tissue    Rehab Potential Good   OT Frequency 2x / week   OT Duration 12 weeks   OT Treatment/Interventions Self-care/ADL training;Fluidtherapy;Splinting;Patient/family education;Therapeutic exercises;Ultrasound;Scar mobilization;Passive range of motion;Neuromuscular education;Electrical Stimulation;Manual Therapy   Plan Assess how wrist cockup and webspacer are doing - scar -   OT Home Exercise Plan see pt instruction   Consulted and Agree with Plan of Care Patient      Patient will benefit from skilled therapeutic intervention in order to improve the following deficits and impairments:  Decreased range of motion, Impaired flexibility, Decreased  coordination, Decreased safety awareness, Increased edema, Impaired sensation, Decreased skin integrity, Decreased knowledge of precautions,  Decreased knowledge of use of DME, Decreased scar mobility, Impaired UE functional use, Pain, Decreased strength, Impaired perceived functional ability  Visit Diagnosis: Stiffness of right hand, not elsewhere classified  Stiffness of right wrist, not elsewhere classified  Pain in right arm  Other disturbances of skin sensation  Scar condition and fibrosis of skin  Muscle weakness (generalized)    Problem List There are no active problems to display for this patient.   Rosalyn Gess OTR/L,CLT 08/30/2016, 7:17 PM  [place] [facility] [address], [place], [address] Phone: [phone]   Fax:  [phone]  Name: Francisco Johns MRN: [id number] Date of Birth: 02-13-1976

## 2016-09-04 ENCOUNTER — Ambulatory Visit: Payer: 59 | Admitting: Occupational Therapy

## 2016-09-04 DIAGNOSIS — M25641 Stiffness of right hand, not elsewhere classified: Secondary | ICD-10-CM

## 2016-09-04 DIAGNOSIS — M25631 Stiffness of right wrist, not elsewhere classified: Secondary | ICD-10-CM

## 2016-09-04 DIAGNOSIS — M79601 Pain in right arm: Secondary | ICD-10-CM | POA: Diagnosis not present

## 2016-09-04 DIAGNOSIS — M6281 Muscle weakness (generalized): Secondary | ICD-10-CM | POA: Diagnosis not present

## 2016-09-04 DIAGNOSIS — L905 Scar conditions and fibrosis of skin: Secondary | ICD-10-CM | POA: Diagnosis not present

## 2016-09-04 DIAGNOSIS — R208 Other disturbances of skin sensation: Secondary | ICD-10-CM

## 2016-09-04 NOTE — Patient Instructions (Signed)
Pt to wear wrist cockup and webspacer at night time and during day on and off -  Anti claw splint remade to provide more stability to extend PIP after flexion with PIP's around 4 cm foam block ( yellow) Cont with cica scar pad at night time and during day on and off   Same HEP    PROM for thumb PA including extention of IP  Place and hold around large cylinder object and hold  Into partial PA 20 reps  Add to HEP   PROM of all digits - with focus on 5th  Place and hold attempted - after remolded new anti ulnar claw splint - pt to grasp place and hold into 4 cm foam block and release and do active PIP extention in splint

## 2016-09-04 NOTE — Therapy (Signed)
[place] [facility] [address], [place], [address] Phone: [phone]   Fax:  [phone]  Occupational Therapy Treatment  Patient Details  Name: Francisco Johns MRN: [id number] Date of Birth: [birth year] Referring Provider: Vernona Rieger  Encounter Date: 09/04/2016      OT End of Session - 09/04/16 2057    Visit Number 6   Number of Visits 24   Date for OT Re-Evaluation 11/21/16   OT Start Time 1120   OT Stop Time 1230   OT Time Calculation (min) 70 min   Activity Tolerance Patient tolerated treatment well   Behavior During Therapy [facility] for tasks assessed/performed      Past Medical History:  Diagnosis Date  . Metatarsal fracture    right big toe    Past Surgical History:  Procedure Laterality Date  . ankle surgery    . NO PAST SURGERIES    . ORIF TOE FRACTURE Right 12/09/2015   Procedure: OPEN REDUCTION INTERNAL FIXATION (ORIF) METATARSAL (TOE) FRACTURE, first metatarsal;  Surgeon: Renette Butters, MD;  Location: [place];  Service: Orthopedics;  Laterality: Right;    There were no vitals filed for this visit.      Subjective Assessment - 09/04/16 2041    Subjective  Scar looks more flat - did do the exercises and wore the wrist splint you made with webspacer - at night and then during day - moving fingers - did do the exericises    Patient Stated Goals I want as much movement and use I can get for my R dominant hand- to work , play with my kids, fish , play golf ,   Currently in Pain? No/denies                      OT Treatments/Exercises (OP) - 09/04/16 0001      Moist Heat Therapy   Number Minutes Moist Heat 10 Minutes   Moist Heat Location --  in pronation , hand in fist - last 5 flexion wrap 5th     Pt to wear wrist cockup and webspacer at night time and during day on and off - webspacer add moldskin under IP to increase extention and add strap at MP to prevent  sliding out of splint)   Anti claw splint remade to provide more stability to extend PIP after flexion with PIP's around 4 cm foam block ( yellow) Cont with cica scar pad at night time and during day on and off   desentitization done this date again - soft texture  cloth , pt to work on towel and rougher clothes and massage - still hyper sensitive   PROM for pronation done after heat  place and hold done  BTE 0 lbs - 120 sec place and hold pronation  BTE CPM for wrist extention 200 sec  Rolling on and over 2kg ball - maintain wrist extention in neutral 20 x  2kg ball with both hands for RD 10 reps   PROM for thumb PA including extention of IP  Place and hold around large cylinder object and hold  Into partial PA 20 reps  Add to HEP  Review with pt healing of nerves and what innervation of nerve to muscle he has and what to expect next PROM of all digits - with focus on 5th  Place and hold attempted - after remolded new anti ulnar claw splint - pt to grasp place  and hold into 4 cm foam block and release and do active PIP extention in splint   Attempted estim this date on ABD pollicus and ext pollicus - but could not get contraction             OT Education - 09/04/16 2056    Education provided Yes   Education Details nerve healing progress made this far- upgrade HEP    Person(s) Educated Patient   Methods Explanation;Demonstration;Tactile cues;Verbal cues   Comprehension Verbal cues required;Returned demonstration;Verbalized understanding          OT Short Term Goals - 08/21/16 1433      OT SHORT TERM GOAL #1   Title Pt to be ind in HEP to use splints correctly , increase ROM and sensatin in R hand and wrist    Time 3   Period Weeks   Status New     OT SHORT TERM GOAL #2   Title Fabricate/ modify or change splints as needed to prevent contractures in R hand and wrist while nerve healing occur   Baseline Webspace getting tight , IP of thumb flexor contracture -     Time 3   Period Weeks   Status New     OT SHORT TERM GOAL #3   Title R wrist AROM and PROM improve with 10-15 degrees to be able carry object on palm, wipe table and reach with wrist extention to grip    Baseline RD 0, Ext 2, UD 15 flexion 46, pronation 40, Sup 60   Time 4   Period Weeks   Status New     OT SHORT TERM GOAL #4   Title Sensation improve in R hand to identify  ojbects in palm 50% of time    Baseline Tinel at base of thumb and DPC , MC - did not do Semmes weinstein - time contrain   Time 4   Period Weeks   Status New           OT Long Term Goals - 08/21/16 1437      OT LONG TERM GOAL #1   Title Upgrade HEP to increase PIP and MC flexion as nerve is healing to grasp 6 inch object to 3 inch object   Baseline see flowsheet    Time 6   Period Weeks   Status New     OT LONG TERM GOAL #2   Title Wrist ROM improve and strength for pt to intiate using 1 lbs weight for wrist in all planes    Baseline UD without gravity , 0 RD, wrist extention partial range against gravity , pron and sup 40 and 60 degrees - no strengthening    Time 8   Period Weeks   Status New     OT LONG TERM GOAL #3   Title Assess shoulder and upgrade HEP to decrease pain and compensation    Time 4   Period Weeks   Status New               Plan - 09/04/16 2058    Clinical Impression Statement Pt making progress in supination , PROM pronation and thumb PA and IP extention , able to place and hold into 4 cm foamblock with 2nd thru 4th with anti ulnar claw splint - scar still adhere severely but not as hypertrophic with cica scar pad    Rehab Potential Good   OT Frequency 2x / week   OT Duration 12 weeks   OT  Treatment/Interventions Self-care/ADL training;Fluidtherapy;Splinting;Patient/family education;Therapeutic exercises;Ultrasound;Scar mobilization;Passive range of motion;Neuromuscular education;Electrical Stimulation;Manual Therapy   Plan modify splints as needed and upgrade HEP     OT Home Exercise Plan see pt instruction   Consulted and Agree with Plan of Care Patient      Patient will benefit from skilled therapeutic intervention in order to improve the following deficits and impairments:  Decreased range of motion, Impaired flexibility, Decreased coordination, Decreased safety awareness, Increased edema, Impaired sensation, Decreased skin integrity, Decreased knowledge of precautions, Decreased knowledge of use of DME, Decreased scar mobility, Impaired UE functional use, Pain, Decreased strength, Impaired perceived functional ability  Visit Diagnosis: Stiffness of right hand, not elsewhere classified  Stiffness of right wrist, not elsewhere classified  Pain in right arm  Other disturbances of skin sensation  Scar condition and fibrosis of skin  Muscle weakness (generalized)    Problem List There are no active problems to display for this patient.   Rosalyn Gess OTR/L,CLT 09/04/2016, 9:03 PM  [place] [facility] [address] [place], [address] Phone: [phone]   Fax:  [phone]  Name: Francisco Johns MRN: [id number] Date of Birth: [birth year]

## 2016-09-06 ENCOUNTER — Ambulatory Visit: Payer: 59 | Admitting: Occupational Therapy

## 2016-09-06 DIAGNOSIS — M25631 Stiffness of right wrist, not elsewhere classified: Secondary | ICD-10-CM

## 2016-09-06 DIAGNOSIS — M6281 Muscle weakness (generalized): Secondary | ICD-10-CM | POA: Diagnosis not present

## 2016-09-06 DIAGNOSIS — M25641 Stiffness of right hand, not elsewhere classified: Secondary | ICD-10-CM

## 2016-09-06 DIAGNOSIS — R208 Other disturbances of skin sensation: Secondary | ICD-10-CM | POA: Diagnosis not present

## 2016-09-06 DIAGNOSIS — M79601 Pain in right arm: Secondary | ICD-10-CM

## 2016-09-06 DIAGNOSIS — L905 Scar conditions and fibrosis of skin: Secondary | ICD-10-CM | POA: Diagnosis not present

## 2016-09-06 NOTE — Patient Instructions (Signed)
Same as last time  

## 2016-09-06 NOTE — Therapy (Signed)
[place] [facility] [address], [place], [address] Phone: [phone]   Fax:  [phone]  Occupational Therapy Treatment  Patient Details  Name: Francisco Johns MRN: [id number] Date of Birth: 07-24-1975 Referring Provider: Vernona Rieger  Encounter Date: 09/06/2016      OT End of Session - 09/06/16 1818    Visit Number 7   Number of Visits 24   Date for OT Re-Evaluation 11/21/16   OT Start Time 1013   OT Stop Time 1125   OT Time Calculation (min) 72 min   Activity Tolerance Patient tolerated treatment well   Behavior During Therapy [facility] for tasks assessed/performed      Past Medical History:  Diagnosis Date  . Metatarsal fracture    right big toe    Past Surgical History:  Procedure Laterality Date  . ankle surgery    . NO PAST SURGERIES    . ORIF TOE FRACTURE Right 12/09/2015   Procedure: OPEN REDUCTION INTERNAL FIXATION (ORIF) METATARSAL (TOE) FRACTURE, first metatarsal;  Surgeon: Renette Butters, MD;  Location: [place];  Service: Orthopedics;  Laterality: Right;    There were no vitals filed for this visit.      Subjective Assessment - 09/06/16 1815    Subjective  Doing okay - did my exercises - my hand in the am swollen and hard to get my fingers moving - doing HEP and wearing splints - scar pad I like but it do not cover end of scar in palm -that one feels tight    Patient Stated Goals I want as much movement and use I can get for my R dominant hand- to work , play with my kids, fish , play golf ,   Currently in Pain? No/denies            [facility] OT Assessment - 09/06/16 0001      AROM   Right Forearm Pronation 58 Degrees   Right Forearm Supination 70 Degrees   Right Wrist Extension 2 Degrees  65   Right Wrist Flexion 65 Degrees   Right Wrist Radial Deviation 6 Degrees   Right Wrist Ulnar Deviation 18 Degrees      Pt to wear wrist cockup and webspacer at night time and  during day on and off -  Anti claw splint  more stability to extend PIP after flexion with PIP's around 4 cm foam block ( yellow) Cont with cica scar pad at night time and during day on and off  - larger one provided   Scar massage done around , proximal, distal  - as well as CT spreads - with Wrist extention and flexion   PROM for pronation done after heat  place and hold done  BTE 0 lbs - 120 sec for supination   BTE CPM for wrist extention 200 sec  And repeat end range again for 100 sec Rolling on and over 2kg ball - maintain wrist extention in neutral 20 x  PROM RD of wrist 10 reps prior to  2kg ball with both hands for RD 10 reps   PROM for thumb PA including extention of IP  Place and hold around large cylinder object and hold  Into partial PA 10 reps    PROM of all digits - with focus on 5th  anti ulnar claw splint - pt to grasp place and hold into 4 cm foam block and release and do active PIP extention in splint  estim this date on wrist extensors - 10 min - per protocol for reed - 18 current    With active wrist extention             OT Treatments/Exercises (OP) - 09/06/16 0001      Moist Heat Therapy   Number Minutes Moist Heat 10 Minutes   Moist Heat Location --  Hand in fist , coban strap 5th and 4th into flexion last 4 m     Electrical Stimulation   Electrical Stimulation Location 10   Electrical Stimulation Action wrist extention    Electrical Stimulation Parameters protocol for neure reed    Printmaker Goals Neuromuscular facilitation                OT Education - 09/06/16 1817    Education provided Yes   Education Details progress since 2 wks ago and HEP    Person(s) Educated Patient   Methods Explanation;Demonstration;Tactile cues;Verbal cues   Comprehension Verbal cues required;Returned demonstration;Verbalized understanding          OT Short Term Goals - 08/21/16 1433      OT SHORT TERM GOAL #1   Title Pt to be  ind in HEP to use splints correctly , increase ROM and sensatin in R hand and wrist    Time 3   Period Weeks   Status New     OT SHORT TERM GOAL #2   Title Fabricate/ modify or change splints as needed to prevent contractures in R hand and wrist while nerve healing occur   Baseline Webspace getting tight , IP of thumb flexor contracture -    Time 3   Period Weeks   Status New     OT SHORT TERM GOAL #3   Title R wrist AROM and PROM improve with 10-15 degrees to be able carry object on palm, wipe table and reach with wrist extention to grip    Baseline RD 0, Ext 2, UD 15 flexion 46, pronation 40, Sup 60   Time 4   Period Weeks   Status New     OT SHORT TERM GOAL #4   Title Sensation improve in R hand to identify  ojbects in palm 50% of time    Baseline Tinel at base of thumb and DPC , MC - did not do Semmes weinstein - time contrain   Time 4   Period Weeks   Status New           OT Long Term Goals - 08/21/16 1437      OT LONG TERM GOAL #1   Title Upgrade HEP to increase PIP and MC flexion as nerve is healing to grasp 6 inch object to 3 inch object   Baseline see flowsheet    Time 6   Period Weeks   Status New     OT LONG TERM GOAL #2   Title Wrist ROM improve and strength for pt to intiate using 1 lbs weight for wrist in all planes    Baseline UD without gravity , 0 RD, wrist extention partial range against gravity , pron and sup 40 and 60 degrees - no strengthening    Time 8   Period Weeks   Status New     OT LONG TERM GOAL #3   Title Assess shoulder and upgrade HEP to decrease pain and compensation    Time 4   Period Weeks   Status New  Plan - 09/06/16 1821    Clinical Impression Statement Pt showed increase wrist exte, flexion , sup , RD since 2 wks ago - ROM of digits and thumb tight - and scar adhere on volar forearm and wrist  -   Rehab Potential Good   OT Frequency 2x / week   OT Duration 8 weeks   OT Treatment/Interventions  Self-care/ADL training;Fluidtherapy;Splinting;Patient/family education;Therapeutic exercises;Ultrasound;Scar mobilization;Passive range of motion;Neuromuscular education;Electrical Stimulation;Manual Therapy   Plan cont same HEP  and scar massage    OT Home Exercise Plan see pt instruction   Consulted and Agree with Plan of Care Patient      Patient will benefit from skilled therapeutic intervention in order to improve the following deficits and impairments:  Decreased range of motion, Impaired flexibility, Decreased coordination, Decreased safety awareness, Increased edema, Impaired sensation, Decreased skin integrity, Decreased knowledge of precautions, Decreased knowledge of use of DME, Decreased scar mobility, Impaired UE functional use, Pain, Decreased strength, Impaired perceived functional ability  Visit Diagnosis: Stiffness of right hand, not elsewhere classified  Stiffness of right wrist, not elsewhere classified  Pain in right arm  Other disturbances of skin sensation  Scar condition and fibrosis of skin  Muscle weakness (generalized)    Problem List There are no active problems to display for this patient.   Rosalyn Gess OTR/L,CLT 09/06/2016, 6:36 PM  [place] [facility] [address], [place], [address] Phone: [phone]   Fax:  [phone]  Name: Francisco Johns MRN: [id number] Date of Birth: 01/03/1976

## 2016-09-10 ENCOUNTER — Ambulatory Visit: Payer: 59 | Admitting: Occupational Therapy

## 2016-09-10 DIAGNOSIS — M25631 Stiffness of right wrist, not elsewhere classified: Secondary | ICD-10-CM

## 2016-09-10 DIAGNOSIS — M25641 Stiffness of right hand, not elsewhere classified: Secondary | ICD-10-CM

## 2016-09-10 DIAGNOSIS — M6281 Muscle weakness (generalized): Secondary | ICD-10-CM | POA: Diagnosis not present

## 2016-09-10 DIAGNOSIS — L905 Scar conditions and fibrosis of skin: Secondary | ICD-10-CM | POA: Diagnosis not present

## 2016-09-10 DIAGNOSIS — R208 Other disturbances of skin sensation: Secondary | ICD-10-CM

## 2016-09-10 DIAGNOSIS — M79601 Pain in right arm: Secondary | ICD-10-CM | POA: Diagnosis not present

## 2016-09-10 NOTE — Therapy (Signed)
[facility] [address], [place], [address] Phone: [phone]   Fax:  [phone]  Occupational Therapy Treatment  Patient Details  Name: JR MILLIRON MRN: [id number] Date of Birth: 01-23-1976 Referring Provider: Vernona Rieger  Encounter Date: 09/10/2016      OT End of Session - 09/10/16 1700    Visit Number 8   Number of Visits 24   Date for OT Re-Evaluation 11/21/16   OT Start Time 1145   OT Stop Time 1302   OT Time Calculation (min) 77 min   Activity Tolerance Patient tolerated treatment well   Behavior During Therapy [facility] for tasks assessed/performed      Past Medical History:  Diagnosis Date  . Metatarsal fracture    right big toe    Past Surgical History:  Procedure Laterality Date  . ankle surgery    . NO PAST SURGERIES    . ORIF TOE FRACTURE Right 12/09/2015   Procedure: OPEN REDUCTION INTERNAL FIXATION (ORIF) METATARSAL (TOE) FRACTURE, first metatarsal;  Surgeon: Renette Butters, MD;  Location: [place];  Service: Orthopedics;  Laterality: Right;    There were no vitals filed for this visit.      Subjective Assessment - 09/10/16 1658    Subjective  DId my exercises - scar looking better from using that scar pad - just need to be positive    Patient Stated Goals I want as much movement and use I can get for my R dominant hand- to work , play with my kids, fish , play golf ,   Currently in Pain? Yes   Pain Location Hand   Pain Orientation Right   Pain Descriptors / Indicators --  nerve pain             [facility] OT Assessment - 09/10/16 0001      Right Hand AROM   R Index  MCP 0-90 --  10 to 50   R Index PIP 0-100 --  25 to 95   R Long  MCP 0-90 --  10 to45   R Long PIP 0-100 --  25 to 90   R Ring  MCP 0-90 --  10 to 40   R Ring PIP 0-100 --  15 to 95   R Little  MCP 0-90 --  15 to 25   R Little PIP 0-100 --  10 to 72                  OT  Treatments/Exercises (OP) - 09/10/16 0001      RUE Fluidotherapy   Number Minutes Fluidotherapy 8 Minutes   RUE Fluidotherapy Location Hand;Wrist   Comments at [facility] with AROM to wrist and digits - sensory reed using corn pulse       assess sensation with semmes weinstein - deep pressure distal to scar on dorsal hand - in palm and volar thumb  4.56 loss protective sensation volar wrist to wrist crease   Cont with cica scar pad at night time and during day on and off  - larger one provided   Scar massage done around , proximal, distal  - as well as CT spreads - with Wrist extention and flexion   PROM for pronation done after heat place and hold done  BTE 0 lbs - 120 sec for supination  and pronation this date  BTE CPM for wrist extention 200 sec  Place and hold 1/2 lbs for  wrist extention - to about neutral  Sup/pro with 3/4 lbs around hand - able to maintain wrist neutral  Pt to hold off on 1 lbs for wrist sup/pro at home  PROM RD of wrist 10 reps prior to  2kg ball with both hands for RD 10 reps   PROM for thumb PA including extention of IP  Place and hold around large cylinder object and hold Into partial PA 10 reps  Rubber band place and hold - 5 reps ABD of thumb - add for 5 reps to HEP    PROM of all digits - with focus on 5th  After coban flexion strap done 3 min    estim this date done on thumb PA - feels it more proximal electrode than distal - pt perform AROM for ABD of thumb with ramp on time            OT Education - 09/10/16 1700    Education provided Yes   Education Details 1/2 and 3/4 lbs for wrist extention and sup/pro - and rubber band for place and hold fo thumb PA partial ROM    Person(s) Educated Patient   Methods Explanation;Demonstration;Tactile cues;Verbal cues   Comprehension Verbal cues required;Returned demonstration;Verbalized understanding          OT Short Term Goals - 08/21/16 1433      OT SHORT TERM GOAL #1   Title Pt to be ind  in HEP to use splints correctly , increase ROM and sensatin in R hand and wrist    Time 3   Period Weeks   Status New     OT SHORT TERM GOAL #2   Title Fabricate/ modify or change splints as needed to prevent contractures in R hand and wrist while nerve healing occur   Baseline Webspace getting tight , IP of thumb flexor contracture -    Time 3   Period Weeks   Status New     OT SHORT TERM GOAL #3   Title R wrist AROM and PROM improve with 10-15 degrees to be able carry object on palm, wipe table and reach with wrist extention to grip    Baseline RD 0, Ext 2, UD 15 flexion 46, pronation 40, Sup 60   Time 4   Period Weeks   Status New     OT SHORT TERM GOAL #4   Title Sensation improve in R hand to identify  ojbects in palm 50% of time    Baseline Tinel at base of thumb and DPC , MC - did not do Semmes weinstein - time contrain   Time 4   Period Weeks   Status New           OT Long Term Goals - 08/21/16 1437      OT LONG TERM GOAL #1   Title Upgrade HEP to increase PIP and MC flexion as nerve is healing to grasp 6 inch object to 3 inch object   Baseline see flowsheet    Time 6   Period Weeks   Status New     OT LONG TERM GOAL #2   Title Wrist ROM improve and strength for pt to intiate using 1 lbs weight for wrist in all planes    Baseline UD without gravity , 0 RD, wrist extention partial range against gravity , pron and sup 40 and 60 degrees - no strengthening    Time 8   Period Weeks   Status New  OT LONG TERM GOAL #3   Title Assess shoulder and upgrade HEP to decrease pain and compensation    Time 4   Period Weeks   Status New               Plan - 09/10/16 1702    Clinical Impression Statement Pt cont to show progress slowly but steady - digits flexion and extention measured this date and compare to to start - great progress -progress depends on scar tissue, nerve healing    Rehab Potential Good   OT Frequency 2x / week   OT Duration 4 weeks    OT Treatment/Interventions Self-care/ADL training;Fluidtherapy;Splinting;Patient/family education;Therapeutic exercises;Ultrasound;Scar mobilization;Passive range of motion;Neuromuscular education;Electrical Stimulation;Manual Therapy   Plan assess his HEP and modify - priorities   OT Home Exercise Plan see pt instruction   Consulted and Agree with Plan of Care Patient      Patient will benefit from skilled therapeutic intervention in order to improve the following deficits and impairments:  Decreased range of motion, Impaired flexibility, Decreased coordination, Decreased safety awareness, Increased edema, Impaired sensation, Decreased skin integrity, Decreased knowledge of precautions, Decreased knowledge of use of DME, Decreased scar mobility, Impaired UE functional use, Pain, Decreased strength, Impaired perceived functional ability  Visit Diagnosis: Stiffness of right hand, not elsewhere classified  Stiffness of right wrist, not elsewhere classified  Pain in right arm  Other disturbances of skin sensation  Scar condition and fibrosis of skin  Muscle weakness (generalized)    Problem List There are no active problems to display for this patient.   Rosalyn Gess OTR/L,CLT 09/10/2016, 7:31 PM  [facility] [address], [place], [address] Phone: [phone]   Fax:  [phone]  Name: HILL MACKIE MRN: [id number] Date of Birth: 1975-05-06

## 2016-09-10 NOTE — Patient Instructions (Addendum)
add to HEP  Place and hold 1/2 lbs for wrist extention - to about neutral  Sup/pro with 3/4 lbs around hand - able to maintain wrist neutral  Pt to hold off on 1 lbs for wrist sup/pro at home  Place and hold around large cylinder object and hold Into partial PA 10 reps  Rubber band place and hold - 5 reps ABD of thumb - add for 5 reps to HEP    Cont with same other HEP  Will reassess HEP next session

## 2016-09-12 ENCOUNTER — Ambulatory Visit: Payer: 59 | Admitting: Occupational Therapy

## 2016-09-12 DIAGNOSIS — M79601 Pain in right arm: Secondary | ICD-10-CM

## 2016-09-12 DIAGNOSIS — R208 Other disturbances of skin sensation: Secondary | ICD-10-CM

## 2016-09-12 DIAGNOSIS — M6281 Muscle weakness (generalized): Secondary | ICD-10-CM

## 2016-09-12 DIAGNOSIS — M25641 Stiffness of right hand, not elsewhere classified: Secondary | ICD-10-CM

## 2016-09-12 DIAGNOSIS — M25631 Stiffness of right wrist, not elsewhere classified: Secondary | ICD-10-CM

## 2016-09-12 DIAGNOSIS — L905 Scar conditions and fibrosis of skin: Secondary | ICD-10-CM

## 2016-09-12 NOTE — Therapy (Signed)
[facility] [address], [place], [address] Phone: [phone]   Fax:  [phone]  Occupational Therapy Treatment  Patient Details  Name: Francisco Johns MRN: [id number] Date of Birth: 09/30/1975 Referring Provider: Vernona Rieger  Encounter Date: 09/12/2016      OT End of Session - 09/12/16 1611    Visit Number 9   Number of Visits 24   Date for OT Re-Evaluation 11/21/16   OT Start Time 1340   OT Stop Time 1455   OT Time Calculation (min) 75 min   Activity Tolerance Patient tolerated treatment well   Behavior During Therapy [facility] for tasks assessed/performed      Past Medical History:  Diagnosis Date  . Metatarsal fracture    right big toe    Past Surgical History:  Procedure Laterality Date  . ankle surgery    . NO PAST SURGERIES    . ORIF TOE FRACTURE Right 12/09/2015   Procedure: OPEN REDUCTION INTERNAL FIXATION (ORIF) METATARSAL (TOE) FRACTURE, first metatarsal;  Surgeon: Renette Butters, MD;  Location: [facility];  Service: Orthopedics;  Laterality: Right;    There were no vitals filed for this visit.      Subjective Assessment - 09/12/16 1608    Subjective  Doing okay - trying to do things around the house - working the scar    Patient Stated Goals I want as much movement and use I can get for my R dominant hand- to work , play with my kids, fish , play golf ,   Currently in Pain? No/denies                      OT Treatments/Exercises (OP) - 09/12/16 0001      Moist Heat Therapy   Number Minutes Moist Heat 10 Minutes   Moist Heat Location --  hand in fist and pronation - coban flexion strap on 4 and 5     Electrical Stimulation   Electrical Stimulation Location 10   Electrical Stimulation Action thumb PA  doing AROM thumb PA with on cycle   Electrical Stimulation Parameters protocol for muscle reed  19 current    Electrical Stimulation Goals  Neuromuscular facilitation      Pt to wear wrist cockup and webspacer at night time and during day on and off -  Anti claw splint  more stability to extend PIP after flexion with PIP's around 4 cm foam block ( yellow) Cont with cica scar pad at night time and during day on and off  - larger one provided   Scar moving better on ulnar side   PROM for pronation done after heat place and hold done  BTE pronation 0 lbs 120 sec - place and hold BTE 2 lbs - 120 sec for supination   BTE CPM for wrist extention 200 sec  1/2 lbs for wrist extention with gravity and in horizontal plane PROM RD of wrist 10 reps  2kg ball with both hands for RD 10 reps  Tinel assess - in digits for thumb thru 3rd - 4thand 5th in proximal phalanges  Did feel cold on thumb and index this date and palm   PROM for thumb PA including extention of IP  Place and hold around large cylinder object and hold Into partial PA 10 reps  Grasp and release large light objects - and 3 inch objects - insulating tube Pt to use it at  home in sweeping , carry something light    PROM of all digits - with focus on 5th   2 lbs cuff weight for shoulder retraction , protraction and retraction ( punching) Shoulder extention and elbow extention  scaption to 90 with scapula on wall  15 reps each   1/2 weight for wrist extention with gravity and in horizontal plane Try to use hand functionally to graps light 3 inch objects   estim this date on thumb PA  -AROM release 3 cm insulating tube with every on cycle for 10 min           OT Education - 09/12/16 1610    Education provided Yes   Education Details Using 1/2 weight for wrist - cuff 2 lbs weight for elbow and shoulder   Person(s) Educated Patient   Methods Explanation;Demonstration;Tactile cues;Verbal cues   Comprehension Verbal cues required;Returned demonstration;Verbalized understanding          OT Short Term Goals - 08/21/16 1433      OT SHORT TERM GOAL #1    Title Pt to be ind in HEP to use splints correctly , increase ROM and sensatin in R hand and wrist    Time 3   Period Weeks   Status New     OT SHORT TERM GOAL #2   Title Fabricate/ modify or change splints as needed to prevent contractures in R hand and wrist while nerve healing occur   Baseline Webspace getting tight , IP of thumb flexor contracture -    Time 3   Period Weeks   Status New     OT SHORT TERM GOAL #3   Title R wrist AROM and PROM improve with 10-15 degrees to be able carry object on palm, wipe table and reach with wrist extention to grip    Baseline RD 0, Ext 2, UD 15 flexion 46, pronation 40, Sup 60   Time 4   Period Weeks   Status New     OT SHORT TERM GOAL #4   Title Sensation improve in R hand to identify  ojbects in palm 50% of time    Baseline Tinel at base of thumb and DPC , MC - did not do Semmes weinstein - time contrain   Time 4   Period Weeks   Status New           OT Long Term Goals - 08/21/16 1437      OT LONG TERM GOAL #1   Title Upgrade HEP to increase PIP and MC flexion as nerve is healing to grasp 6 inch object to 3 inch object   Baseline see flowsheet    Time 6   Period Weeks   Status New     OT LONG TERM GOAL #2   Title Wrist ROM improve and strength for pt to intiate using 1 lbs weight for wrist in all planes    Baseline UD without gravity , 0 RD, wrist extention partial range against gravity , pron and sup 40 and 60 degrees - no strengthening    Time 8   Period Weeks   Status New     OT LONG TERM GOAL #3   Title Assess shoulder and upgrade HEP to decrease pain and compensation    Time 4   Period Weeks   Status New               Plan - 09/12/16 1611    Clinical Impression Statement Pt show  progress in Tinel for thumb and 2nd thru 3rd into digits and 4th and 5th more towards proximal phalanges- increase thumb PA with compensation - also show increase wrist extention strength - pt did fell cold on thumb this date     Rehab Potential Good   OT Frequency 2x / week   OT Duration 4 weeks   OT Treatment/Interventions Self-care/ADL training;Fluidtherapy;Splinting;Patient/family education;Therapeutic exercises;Ultrasound;Scar mobilization;Passive range of motion;Neuromuscular education;Electrical Stimulation;Manual Therapy   Plan assess progress and update HEP as needed    OT Home Exercise Plan see pt instruction   Consulted and Agree with Plan of Care Patient      Patient will benefit from skilled therapeutic intervention in order to improve the following deficits and impairments:  Decreased range of motion, Impaired flexibility, Decreased coordination, Decreased safety awareness, Increased edema, Impaired sensation, Decreased skin integrity, Decreased knowledge of precautions, Decreased knowledge of use of DME, Decreased scar mobility, Impaired UE functional use, Pain, Decreased strength, Impaired perceived functional ability  Visit Diagnosis: Stiffness of right hand, not elsewhere classified  Stiffness of right wrist, not elsewhere classified  Pain in right arm  Other disturbances of skin sensation  Scar condition and fibrosis of skin  Muscle weakness (generalized)    Problem List There are no active problems to display for this patient.   Rosalyn Gess OTR/L,CLT 09/12/2016, 4:17 PM  [facility] [address], [place], [address] Phone: [phone]   Fax:  [phone]  Name: KYRELL RUACHO MRN: [id number] Date of Birth: [birth year]

## 2016-09-12 NOTE — Patient Instructions (Signed)
2 lbs cuff weight for shoulder retraction , protraction and retraction ( punching) Shoulder extention and elbow extention  scaption to 90 with scapula on wall  15 reps each   1/2 weight for wrist extention with gravity and in horizontal plane Try to use hand functionally to graps light 3 inch objects

## 2016-09-19 ENCOUNTER — Ambulatory Visit: Payer: 59 | Admitting: Occupational Therapy

## 2016-09-19 DIAGNOSIS — M25631 Stiffness of right wrist, not elsewhere classified: Secondary | ICD-10-CM | POA: Diagnosis not present

## 2016-09-19 DIAGNOSIS — M25641 Stiffness of right hand, not elsewhere classified: Secondary | ICD-10-CM | POA: Diagnosis not present

## 2016-09-19 DIAGNOSIS — M6281 Muscle weakness (generalized): Secondary | ICD-10-CM

## 2016-09-19 DIAGNOSIS — L905 Scar conditions and fibrosis of skin: Secondary | ICD-10-CM | POA: Diagnosis not present

## 2016-09-19 DIAGNOSIS — R208 Other disturbances of skin sensation: Secondary | ICD-10-CM | POA: Diagnosis not present

## 2016-09-19 DIAGNOSIS — M79601 Pain in right arm: Secondary | ICD-10-CM | POA: Diagnosis not present

## 2016-09-19 NOTE — Patient Instructions (Addendum)
Same HEP  

## 2016-09-19 NOTE — Therapy (Signed)
[facility] [address], [place], [address] Phone: [phone]   Fax:  [phone]  Occupational Therapy Treatment  Patient Details  Name: Francisco Johns MRN: [id number] Date of Birth: 03-04-1976 Referring Provider: Vernona Rieger  Encounter Date: 09/19/2016      OT End of Session - 09/19/16 1028    Visit Number 11   Number of Visits 24   Date for OT Re-Evaluation 11/21/16   OT Start Time 0905   OT Stop Time 1015   OT Time Calculation (min) 70 min   Activity Tolerance Patient tolerated treatment well   Behavior During Therapy [facility] for tasks assessed/performed      Past Medical History:  Diagnosis Date  . Metatarsal fracture    right big toe    Past Surgical History:  Procedure Laterality Date  . ankle surgery    . NO PAST SURGERIES    . ORIF TOE FRACTURE Right 12/09/2015   Procedure: OPEN REDUCTION INTERNAL FIXATION (ORIF) METATARSAL (TOE) FRACTURE, first metatarsal;  Surgeon: Renette Butters, MD;  Location: [facility];  Service: Orthopedics;  Laterality: Right;    There were no vitals filed for this visit.      Subjective Assessment - 09/19/16 0906    Subjective  Did okay - your bought weight 2 lbs - trying to grip more - aircon in car on back hand of hand first time - cold I can feel    Patient Stated Goals I want as much movement and use I can get for my R dominant hand- to work , play with my kids, fish , play golf ,   Currently in Pain? No/denies            [facility] OT Assessment - 09/19/16 0001      AROM   Right Forearm Pronation 66 Degrees   Right Forearm Supination 75 Degrees                  OT Treatments/Exercises (OP) - 09/19/16 0001      Moist Heat Therapy   Number Minutes Moist Heat 10 Minutes   Moist Heat Location Hand;Wrist  coban 5th digit flexion with coban last 5 min       Pt to  Cont wear wrist cockup and webspacer at night time and during  day on and off -  Anti claw splint more stability to extend PIP after flexion with PIP's around 4 cm foam block ( yellow) Cont with cica scar pad at night time and during day on and off - larger one provided    PROM for pronation done after heat place and hold done  BTE pronation 0 lbs 120 sec - place and hold - 100 sec near full motion -  PROM supination  BTE 2 lbs - 120 sec for supination  2lbs for sup - support into neutral  BTE CPM for wrist extention 200 sec  1/2 lbs for wrist extention with gravity and in horizontal plane 1lbs  RD of wrist 10 reps  Then 2 lbs RD of wrist 10 reps  Mod v.c to keep wrist neutral   2 lbs ball with both hands for RD 10 reps    Did feel cold on thumb and index this date and palm   PROM for thumb PA/RA including extention of IP  AAROM and AROM  - place and hold moving away from 2 foam block into RA/ PA 10 reps  PROM of all digits - with focus on 5th - composite  Cont with  2 lbs cuff weight for shoulder retraction , protraction and retraction ( punching) Shoulder extention and elbow extention  scaption to 90 with scapula on wall  15 reps each   1/2 weight for wrist extention with gravity and in horizontal plane Try to use hand functionally to graps light 3 inch objects   estim this date on thumb PA  -AROM release 2 cm foam block  with every on cycle for 8 min              OT Education - 09/19/16 0924    Education provided Yes   Education Details cont wrist extention without gravity    Person(s) Educated Patient   Methods Explanation;Demonstration;Tactile cues   Comprehension Verbal cues required;Returned demonstration;Verbalized understanding          OT Short Term Goals - 08/21/16 1433      OT SHORT TERM GOAL #1   Title Pt to be ind in HEP to use splints correctly , increase ROM and sensatin in R hand and wrist    Time 3   Period Weeks   Status New     OT SHORT TERM GOAL #2   Title Fabricate/ modify or  change splints as needed to prevent contractures in R hand and wrist while nerve healing occur   Baseline Webspace getting tight , IP of thumb flexor contracture -    Time 3   Period Weeks   Status New     OT SHORT TERM GOAL #3   Title R wrist AROM and PROM improve with 10-15 degrees to be able carry object on palm, wipe table and reach with wrist extention to grip    Baseline RD 0, Ext 2, UD 15 flexion 46, pronation 40, Sup 60   Time 4   Period Weeks   Status New     OT SHORT TERM GOAL #4   Title Sensation improve in R hand to identify  ojbects in palm 50% of time    Baseline Tinel at base of thumb and DPC , MC - did not do Semmes weinstein - time contrain   Time 4   Period Weeks   Status New           OT Long Term Goals - 08/21/16 1437      OT LONG TERM GOAL #1   Title Upgrade HEP to increase PIP and MC flexion as nerve is healing to grasp 6 inch object to 3 inch object   Baseline see flowsheet    Time 6   Period Weeks   Status New     OT LONG TERM GOAL #2   Title Wrist ROM improve and strength for pt to intiate using 1 lbs weight for wrist in all planes    Baseline UD without gravity , 0 RD, wrist extention partial range against gravity , pron and sup 40 and 60 degrees - no strengthening    Time 8   Period Weeks   Status New     OT LONG TERM GOAL #3   Title Assess shoulder and upgrade HEP to decrease pain and compensation    Time 4   Period Weeks   Status New               Plan - 09/19/16 1610    Clinical Impression Statement Pt showed increase pronation and supination this week - cont to make steady  progress-    Rehab Potential Good   OT Frequency 2x / week   OT Duration 4 weeks   OT Treatment/Interventions Self-care/ADL training;Fluidtherapy;Splinting;Patient/family education;Therapeutic exercises;Ultrasound;Scar mobilization;Passive range of motion;Neuromuscular education;Electrical Stimulation;Manual Therapy   OT Home Exercise Plan see pt  instruction   Consulted and Agree with Plan of Care Patient   Plan assess progress and update HEP      Patient will benefit from skilled therapeutic intervention in order to improve the following deficits and impairments:  Decreased range of motion, Impaired flexibility, Decreased coordination, Decreased safety awareness, Increased edema, Impaired sensation, Decreased skin integrity, Decreased knowledge of precautions, Decreased knowledge of use of DME, Decreased scar mobility, Impaired UE functional use, Pain, Decreased strength, Impaired perceived functional ability  Visit Diagnosis: Stiffness of right hand, not elsewhere classified  Stiffness of right wrist, not elsewhere classified  Pain in right arm  Other disturbances of skin sensation  Scar condition and fibrosis of skin  Muscle weakness (generalized)    Problem List There are no active problems to display for this patient.   Rosalyn Gess OTR/L,CLT 09/19/2016, 11:39 AM  [facility] [address] [place], [address] Phone: [phone]   Fax:  [phone]  Name: Francisco Johns MRN: [id number] Date of Birth: 1975-09-12

## 2016-09-20 ENCOUNTER — Ambulatory Visit: Payer: 59 | Admitting: Occupational Therapy

## 2016-09-20 DIAGNOSIS — M25631 Stiffness of right wrist, not elsewhere classified: Secondary | ICD-10-CM

## 2016-09-20 DIAGNOSIS — M25641 Stiffness of right hand, not elsewhere classified: Secondary | ICD-10-CM

## 2016-09-20 DIAGNOSIS — R208 Other disturbances of skin sensation: Secondary | ICD-10-CM

## 2016-09-20 DIAGNOSIS — M6281 Muscle weakness (generalized): Secondary | ICD-10-CM

## 2016-09-20 DIAGNOSIS — L905 Scar conditions and fibrosis of skin: Secondary | ICD-10-CM

## 2016-09-20 DIAGNOSIS — M79601 Pain in right arm: Secondary | ICD-10-CM | POA: Diagnosis not present

## 2016-09-20 NOTE — Therapy (Signed)
[facility] [address], [place], [address] Phone: [phone]   Fax:  [phone]  Occupational Therapy Treatment  Patient Details  Name: Francisco Johns MRN: [id number] Date of Birth: December 16, 1975 Referring Provider: Vernona Rieger  Encounter Date: 09/20/2016      OT End of Session - 09/20/16 1140    Visit Number 12   Number of Visits 24   Date for OT Re-Evaluation 11/21/16   OT Start Time 1000   OT Stop Time 1102   OT Time Calculation (min) 62 min   Activity Tolerance Patient tolerated treatment well   Behavior During Therapy [facility] for tasks assessed/performed      Past Medical History:  Diagnosis Date  . Metatarsal fracture    right big toe    Past Surgical History:  Procedure Laterality Date  . ankle surgery    . NO PAST SURGERIES    . ORIF TOE FRACTURE Right 12/09/2015   Procedure: OPEN REDUCTION INTERNAL FIXATION (ORIF) METATARSAL (TOE) FRACTURE, first metatarsal;  Surgeon: Renette Butters, MD;  Location: [facility];  Service: Orthopedics;  Laterality: Right;    There were no vitals filed for this visit.      Subjective Assessment - 09/20/16 1133    Subjective  I could feel my arm was tired yesterday - tried to do wrist exercises but did not work as good - pinkie in the morning so stiff - still can feel cold on hand now - and can tell hand is getting better    Patient Stated Goals I want as much movement and use I can get for my R dominant hand- to work , play with my kids, fish , play golf ,   Currently in Pain? No/denies                      OT Treatments/Exercises (OP) - 09/20/16 0001      RUE Fluidotherapy   Number Minutes Fluidotherapy 8 Minutes   RUE Fluidotherapy Location Hand;Wrist   Comments AROM for wrist in all planes prior to onset and prior to flexion coban for 5th       Pt to  Cont wear wrist cockup and webspacer at night time and during day on  and off -  Anti claw splint more stability to extend PIP after flexion  Cont with cica scar pad at night time and during day on and off - larger one provided  Kinesiotape this date radial scar and 1 cm overlap onto graft volarly - to decrease scar tissue - pt ed on precautions and extra provided for wife that is RN to replace    PROM for pronation done after heat place and hold done  BTE pronation 0 lbs 120 sec - place and hold - 120 sec near full motion -  BTE 2lbs - 180 sec for supination  BTE CPM for wrist extention 200 sec 3/4 lbs for wrist extention  in horizontal plane - AROM and place and hold 12 reps  Then 2 lbs RD of wrist 10 reps  Mod v.c to keep wrist neutral   UBE for 5 min BW and FW changing every min - using R hand 40% and L 60%    Did feel cold on thumb and index this date and palm   PROM for thumb PA/RA including extention of IP  AAROM and AROM  - place and hold moving away from 4 foam  block into RA/ PA 10 reps  PROM of all digits - with focus on 5th - composite  Estim done - AROM thumb ABD with Estim as before for ABD of thumb - way from 3 cm objects  protocol for neuro reed -  And PROM every 5th cycle  Cont with  2 lbs cuff weight for shoulder retraction , protraction and retraction ( punching) Shoulder extention and elbow extention  scaption to 90 with scapula on wall  15 reps each   1/2 weight for wrist extention with gravity and in horizontal plane Try to use hand functionally to graps light 3 inch objects            OT Education - 09/20/16 1140    Education provided Yes   Education Details not strenghtening this afternoon - had therpay 2 days in row -    Person(s) Educated Patient   Methods Explanation;Demonstration;Tactile cues;Verbal cues   Comprehension Verbal cues required;Returned demonstration;Verbalized understanding          OT Short Term Goals - 08/21/16 1433      OT SHORT TERM GOAL #1   Title Pt to be ind in HEP to  use splints correctly , increase ROM and sensatin in R hand and wrist    Time 3   Period Weeks   Status New     OT SHORT TERM GOAL #2   Title Fabricate/ modify or change splints as needed to prevent contractures in R hand and wrist while nerve healing occur   Baseline Webspace getting tight , IP of thumb flexor contracture -    Time 3   Period Weeks   Status New     OT SHORT TERM GOAL #3   Title R wrist AROM and PROM improve with 10-15 degrees to be able carry object on palm, wipe table and reach with wrist extention to grip    Baseline RD 0, Ext 2, UD 15 flexion 46, pronation 40, Sup 60   Time 4   Period Weeks   Status New     OT SHORT TERM GOAL #4   Title Sensation improve in R hand to identify  ojbects in palm 50% of time    Baseline Tinel at base of thumb and DPC , MC - did not do Semmes weinstein - time contrain   Time 4   Period Weeks   Status New           OT Long Term Goals - 08/21/16 1437      OT LONG TERM GOAL #1   Title Upgrade HEP to increase PIP and MC flexion as nerve is healing to grasp 6 inch object to 3 inch object   Baseline see flowsheet    Time 6   Period Weeks   Status New     OT LONG TERM GOAL #2   Title Wrist ROM improve and strength for pt to intiate using 1 lbs weight for wrist in all planes    Baseline UD without gravity , 0 RD, wrist extention partial range against gravity , pron and sup 40 and 60 degrees - no strengthening    Time 8   Period Weeks   Status New     OT LONG TERM GOAL #3   Title Assess shoulder and upgrade HEP to decrease pain and compensation    Time 4   Period Weeks   Status New  Plan - 09/20/16 1140    Clinical Impression Statement Pt shows progress in ROM and strength in wrist , thumb ABD , PIP extention with MC blocked , flexion in PIP's  , nerve healing -  report feeling cold sensation with objects - Tinels progressing     Occupational performance deficits (Please refer to evaluation for  details): ADL's;IADL's;Work;Play;Leisure   Rehab Potential Good   OT Frequency 2x / week   OT Duration 4 weeks   OT Treatment/Interventions Self-care/ADL training;Fluidtherapy;Splinting;Patient/family education;Therapeutic exercises;Ultrasound;Scar mobilization;Passive range of motion;Neuromuscular education;Electrical Stimulation;Manual Therapy   Plan assess progress and change HEP as needed    OT Home Exercise Plan see pt instruction   Consulted and Agree with Plan of Care Patient      Patient will benefit from skilled therapeutic intervention in order to improve the following deficits and impairments:  Decreased range of motion, Impaired flexibility, Decreased coordination, Decreased safety awareness, Increased edema, Impaired sensation, Decreased skin integrity, Decreased knowledge of precautions, Decreased knowledge of use of DME, Decreased scar mobility, Impaired UE functional use, Pain, Decreased strength, Impaired perceived functional ability  Visit Diagnosis: Stiffness of right hand, not elsewhere classified  Stiffness of right wrist, not elsewhere classified  Pain in right arm  Other disturbances of skin sensation  Scar condition and fibrosis of skin  Muscle weakness (generalized)    Problem List There are no active problems to display for this patient.   Rosalyn Gess OTR/L,CLT 09/20/2016, 11:46 AM  [facility] [address] [place], [address] Phone: [phone]   Fax:  [phone]  Name: Francisco Johns MRN: [id number] Date of Birth: [birth year]

## 2016-09-20 NOTE — Patient Instructions (Addendum)
Pt to  Cont wear wrist cockup and webspacer at night time and during day on and off -  Anti claw splint more stability to extend PIP after flexion  Cont with cica scar pad at night time and during day on and off - larger one provided  Kinesiotape this date radial scar and 1 cm overlap onto graft volarly - to decrease scar tissue - pt ed on precautions and extra provided for wife that is RN to replace  30% pull on parallel 2 and 100 % across    Cont with ROM to hand and wrist - strengthening as before  Cont with  2 lbs cuff weight for shoulder retraction , protraction and retraction ( punching) Shoulder extention and elbow extention  scaption to 90 with scapula on wall  15 reps each   1/2 weight for wrist extention with gravity and in horizontal plane Try to use hand functionally to graps light 3 inch objects

## 2016-09-25 ENCOUNTER — Ambulatory Visit: Payer: 59 | Attending: Plastic Surgery | Admitting: Occupational Therapy

## 2016-09-25 DIAGNOSIS — M79601 Pain in right arm: Secondary | ICD-10-CM | POA: Insufficient documentation

## 2016-09-25 DIAGNOSIS — M25641 Stiffness of right hand, not elsewhere classified: Secondary | ICD-10-CM | POA: Diagnosis not present

## 2016-09-25 DIAGNOSIS — M6281 Muscle weakness (generalized): Secondary | ICD-10-CM | POA: Diagnosis not present

## 2016-09-25 DIAGNOSIS — R208 Other disturbances of skin sensation: Secondary | ICD-10-CM | POA: Diagnosis not present

## 2016-09-25 DIAGNOSIS — M25631 Stiffness of right wrist, not elsewhere classified: Secondary | ICD-10-CM | POA: Insufficient documentation

## 2016-09-25 DIAGNOSIS — L905 Scar conditions and fibrosis of skin: Secondary | ICD-10-CM | POA: Insufficient documentation

## 2016-09-25 NOTE — Patient Instructions (Addendum)
Same HEP   PROM RD prior to AROM and 2 lbs for RD  Wrist extention PROM prior to AROM - in horizontal plain  Quality movement more important than quantity reinforce with pt

## 2016-09-25 NOTE — Therapy (Signed)
[facility] [address], [place], [address] Phone: [phone]   Fax:  [phone]  Occupational Therapy Treatment  Patient Details  Name: Francisco Johns MRN: [id number] Date of Birth: October 12, 1975 Referring Provider: Vernona Rieger  Encounter Date: 09/25/2016      OT End of Session - 09/25/16 1425    Visit Number 13   Number of Visits 24   Date for OT Re-Evaluation 11/21/16   OT Start Time 6606   OT Stop Time 1510   OT Time Calculation (min) 78 min   Activity Tolerance Patient tolerated treatment well   Behavior During Therapy [facility] for tasks assessed/performed      Past Medical History:  Diagnosis Date  . Metatarsal fracture    right big toe    Past Surgical History:  Procedure Laterality Date  . ankle surgery    . NO PAST SURGERIES    . ORIF TOE FRACTURE Right 12/09/2015   Procedure: OPEN REDUCTION INTERNAL FIXATION (ORIF) METATARSAL (TOE) FRACTURE, first metatarsal;  Surgeon: Renette Butters, MD;  Location: [facility];  Service: Orthopedics;  Laterality: Right;    There were no vitals filed for this visit.      Subjective Assessment - 09/25/16 1418    Subjective  Doing okay - I can see now those bonechips more - is the swelling going down - doing okay -the tape stayed on good - my wife helped me    Patient Stated Goals I want as much movement and use I can get for my R dominant hand- to work , play with my kids, fish , play golf ,   Currently in Pain? Yes   Pain Location Hand   Pain Orientation Right   Pain Descriptors / Indicators Tingling   Pain Type Surgical pain            [facility] OT Assessment - 09/25/16 0001      AROM   Right Forearm Supination 80 Degrees   Right Wrist Extension 20 Degrees  60   Right Wrist Radial Deviation 10 Degrees   Right Wrist Ulnar Deviation 23 Degrees                  OT Treatments/Exercises (OP) - 09/25/16 0001      RUE  Fluidotherapy   Number Minutes Fluidotherapy 5 Minutes   RUE Fluidotherapy Location Hand;Wrist   Comments at [facility] to increase ROM and prior to flexion strap coban for 5th and 4th- and pronation stretch for 5 min       Pt to Cont wear wrist cockup and webspacer at night time and during day on and off -  Anti claw splint more stability to extend PIP after flexion  Cont with cica scar pad at night time and during day on and off - larger one provided if not kinesiotpae on  Kinesiotape this date on ulnar aside , dorsal side and proximal end of graft - to decrease scar tissue - pt ed on precautions and extra provided for wife that is RN to replace- but take off at night the one at the proximal graft      PROM for RD done -  Large knob on BTE for RD - no weight on - 120 sec  No pain   BTE CPM for wrist extention 200 sec 3/4 lbs for wrist extention  in horizontal plane - AROM and place and hold 12 reps  Then 2 lbs  RD of wrist 10 reps  Mod v.c to keep wrist neutral    UBE for 5 min BW and FW changing every min - using R hand 40% and L 60%   Did feel cold on all digits this date   PROM for thumb PA/RA including extention of IP  AAROM and AROM - place and hold moving away from 4 foam block into RA/ PA 10 reps  PROM of all digits - with focus on 5th - composite  Estim done - AROM thumb ABD with Estim as before for ABD of thumb - way from 3 cm objects  protocol for neuro reed -  And PROM every 5th cycle  Cont with  2 lbs cuff weight for shoulder retraction , protraction and retraction ( punching) Shoulder extention and elbow extention  scaption to 90 with scapula on wall  15 reps each             OT Education - 09/25/16 1424    Education provided Yes   Education Details RD PROM prior to RD AROM -    Person(s) Educated Patient   Methods Explanation;Demonstration;Tactile cues   Comprehension Verbal cues required;Returned demonstration;Verbalized understanding           OT Short Term Goals - 08/21/16 1433      OT SHORT TERM GOAL #1   Title Pt to be ind in HEP to use splints correctly , increase ROM and sensatin in R hand and wrist    Time 3   Period Weeks   Status New     OT SHORT TERM GOAL #2   Title Fabricate/ modify or change splints as needed to prevent contractures in R hand and wrist while nerve healing occur   Baseline Webspace getting tight , IP of thumb flexor contracture -    Time 3   Period Weeks   Status New     OT SHORT TERM GOAL #3   Title R wrist AROM and PROM improve with 10-15 degrees to be able carry object on palm, wipe table and reach with wrist extention to grip    Baseline RD 0, Ext 2, UD 15 flexion 46, pronation 40, Sup 60   Time 4   Period Weeks   Status New     OT SHORT TERM GOAL #4   Title Sensation improve in R hand to identify  ojbects in palm 50% of time    Baseline Tinel at base of thumb and DPC , MC - did not do Semmes weinstein - time contrain   Time 4   Period Weeks   Status New           OT Long Term Goals - 08/21/16 1437      OT LONG TERM GOAL #1   Title Upgrade HEP to increase PIP and MC flexion as nerve is healing to grasp 6 inch object to 3 inch object   Baseline see flowsheet    Time 6   Period Weeks   Status New     OT LONG TERM GOAL #2   Title Wrist ROM improve and strength for pt to intiate using 1 lbs weight for wrist in all planes    Baseline UD without gravity , 0 RD, wrist extention partial range against gravity , pron and sup 40 and 60 degrees - no strengthening    Time 8   Period Weeks   Status New     OT LONG TERM GOAL #3   Title  Assess shoulder and upgrade HEP to decrease pain and compensation    Time 4   Period Weeks   Status New               Plan - 09/25/16 1425    Clinical Impression Statement Pt cont  to show improvement in ROM slow but steady -this date able to grasp large knob on BTE and perform RD -pt do show more propenent areas that ortho MD said  was bond fragments - unde skin - but per pt can be move    Occupational performance deficits (Please refer to evaluation for details): ADL's;IADL's;Work;Play;Leisure   Rehab Potential Good   OT Frequency 2x / week   OT Duration 4 weeks   OT Treatment/Interventions Self-care/ADL training;Fluidtherapy;Splinting;Patient/family education;Therapeutic exercises;Ultrasound;Scar mobilization;Passive range of motion;Neuromuscular education;Electrical Stimulation;Manual Therapy   Plan cont to upgrade HEP as pt progress - and cont maintain and improve AROM and PROM    OT Home Exercise Plan see pt instruction   Consulted and Agree with Plan of Care Patient      Patient will benefit from skilled therapeutic intervention in order to improve the following deficits and impairments:  Decreased range of motion, Impaired flexibility, Decreased coordination, Decreased safety awareness, Increased edema, Impaired sensation, Decreased skin integrity, Decreased knowledge of precautions, Decreased knowledge of use of DME, Decreased scar mobility, Impaired UE functional use, Pain, Decreased strength, Impaired perceived functional ability  Visit Diagnosis: Stiffness of right hand, not elsewhere classified  Stiffness of right wrist, not elsewhere classified  Pain in right arm  Other disturbances of skin sensation  Scar condition and fibrosis of skin  Muscle weakness (generalized)    Problem List There are no active problems to display for this patient.   Rosalyn Gess OTR/L,CLT 09/25/2016, 7:06 PM  [facility] [address] [place], [address] Phone: [phone]   Fax:  [phone]  Name: OSMOND STECKMAN MRN: [id number] Date of Birth: 06/21/1975

## 2016-09-27 ENCOUNTER — Ambulatory Visit: Payer: 59 | Admitting: Occupational Therapy

## 2016-09-27 DIAGNOSIS — R208 Other disturbances of skin sensation: Secondary | ICD-10-CM

## 2016-09-27 DIAGNOSIS — M25631 Stiffness of right wrist, not elsewhere classified: Secondary | ICD-10-CM

## 2016-09-27 DIAGNOSIS — M6281 Muscle weakness (generalized): Secondary | ICD-10-CM | POA: Diagnosis not present

## 2016-09-27 DIAGNOSIS — M25641 Stiffness of right hand, not elsewhere classified: Secondary | ICD-10-CM | POA: Diagnosis not present

## 2016-09-27 DIAGNOSIS — L905 Scar conditions and fibrosis of skin: Secondary | ICD-10-CM | POA: Diagnosis not present

## 2016-09-27 DIAGNOSIS — M79601 Pain in right arm: Secondary | ICD-10-CM

## 2016-09-27 NOTE — Patient Instructions (Signed)
Same HEP  

## 2016-09-27 NOTE — Therapy (Signed)
[facility] [address], [place], [address] Phone: [phone]   Fax:  [phone]  Occupational Therapy Treatment  Patient Details  Name: Francisco Johns MRN: [id number] Date of Birth: [birth year] Referring Provider: Vernona Rieger  Encounter Date: 09/27/2016      OT End of Session - 09/27/16 1403    Visit Number 14   Number of Visits 24   Date for OT Re-Evaluation 11/21/16   OT Start Time 0830   OT Stop Time 0935   OT Time Calculation (min) 65 min   Activity Tolerance Patient tolerated treatment well   Behavior During Therapy [facility] for tasks assessed/performed      Past Medical History:  Diagnosis Date  . Metatarsal fracture    right big toe    Past Surgical History:  Procedure Laterality Date  . ankle surgery    . NO PAST SURGERIES    . ORIF TOE FRACTURE Right 12/09/2015   Procedure: OPEN REDUCTION INTERNAL FIXATION (ORIF) METATARSAL (TOE) FRACTURE, first metatarsal;  Surgeon: Renette Butters, MD;  Location: [facility];  Service: Orthopedics;  Laterality: Right;    There were no vitals filed for this visit.      Subjective Assessment - 09/27/16 1401    Subjective  Did okay with my exercises - I do not need to 1/2 lbs weight anymore - on 3/4 lbs for wrist , and then took the tape off like you told me on the graft at night time    Patient Stated Goals I want as much movement and use I can get for my R dominant hand- to work , play with my kids, fish , play golf ,   Currently in Pain? Yes   Pain Location Hand   Pain Orientation Right   Pain Descriptors / Indicators Tingling   Pain Type Surgical pain                      OT Treatments/Exercises (OP) - 09/27/16 0001      Moist Heat Therapy   Number Minutes Moist Heat 10 Minutes   Moist Heat Location Hand  Flexion wrap to 5th last 5 min during heat       Pt to Cont wear wrist cockup and webspacer at night time and  during day on and off -  Anti claw splint more stability to extend PIP after flexion  Cont with cica scar pad at night time and during day on and off - larger one provided if not kinesiotpae on  Scar massage done to ulnar , dorsal -using graston tool nr 2 brushing - and piece of coban for friction to decrease scar tissue  Kinesiotape this date on ulnar aside , dorsal side and proximal end of graft - to decrease scar tissue - pt ed on precautions and extra provided for wife that is RN to replace- but take off at night the one at the proximal graft      PROM for RD done -  Large knob on BTE for RD - no weight on - 120 sec - fatigue at 100 sec  Attempted to increase 1lbs - but pt sliding thumb - and fatigue  No pain   BTE CPM for wrist extention 200 sec 3/4 lbs for wrist extention in horizontal plane - AROM and place and hold 12 reps  Then 2 lbs RD of wrist 10 reps  And supitnation  Mod v.c to  keep wrist neutral  D ring on BTE 2 lbs for sup 120 sec  And D ring for pronation 0 lbs 120 sec  - place and hold 75% of time     PROM for thumb PA/RA including extention of IP  AAROM and AROM - place and hold moving away from 4 foam block into RA/ PA 10 reps  PROM of all digits - with focus on 5th - composite  Estim done - done on neuro rehab protocol for wrist extention -rolling over ball into extention with about 17 current   Cont with  2 lbs cuff weight for shoulder retraction , protraction and retraction ( punching) Shoulder extention and elbow extention  scaption to 90 with scapula on wall  15 reps each            OT Education - 09/27/16 1403    Education provided Yes   Education Details HEP    Person(s) Educated Patient   Methods Explanation;Demonstration;Tactile cues;Verbal cues   Comprehension Verbal cues required;Returned demonstration;Verbalized understanding          OT Short Term Goals - 09/27/16 1405      OT SHORT TERM GOAL #1   Title Pt to be ind  in HEP to use splints correctly , increase ROM and sensatin in R hand and wrist    Baseline Tinel in palm and digits , splint wearing    Time 3   Period Weeks   Status On-going     OT SHORT TERM GOAL #2   Title Fabricate/ modify or change splints as needed to prevent contractures in R hand and wrist while nerve healing occur   Baseline Webspace getting tight , IP of thumb flexor contracture - cont to assess if need to change   Time 3   Period Weeks   Status On-going     OT SHORT TERM GOAL #3   Title R wrist AROM and PROM improve with 10-15 degrees to be able carry object on palm, wipe table and reach with wrist extention to grip    Baseline RD 0, Ext 2, UD 15 flexion 46, pronation 40, Sup 60 - progressing see flowsheet   Time 4   Period Weeks   Status On-going     OT SHORT TERM GOAL #4   Title Sensation improve in R hand to identify  ojbects in palm 50% of time    Baseline Tinel in diigts and palm    Time 4   Period Weeks   Status On-going           OT Long Term Goals - 09/27/16 1406      OT LONG TERM GOAL #1   Title Upgrade HEP to increase PIP and MC flexion as nerve is healing to grasp 6 inch object to 3 inch object   Baseline see flowsheet    Time 4   Period Weeks   Status On-going     OT LONG TERM GOAL #2   Title Wrist ROM improve and strength for pt to intiate using 1 lbs weight for wrist in all planes    Baseline 3/4 lbs for wrist extention , 2 lbs for RD , and sup - not pronation    Time 6   Period Weeks   Status On-going     OT LONG TERM GOAL #3   Title Assess shoulder and upgrade HEP to decrease pain and compensation    Baseline HEP provided - pt report getting better  Time 4   Period Weeks   Status On-going               Plan - 09/27/16 1404    Occupational performance deficits (Please refer to evaluation for details): ADL's;IADL's;Work;Play;Leisure   Rehab Potential Good   OT Frequency 2x / week   OT Duration 4 weeks   OT  Treatment/Interventions Self-care/ADL training;Fluidtherapy;Splinting;Patient/family education;Therapeutic exercises;Ultrasound;Scar mobilization;Passive range of motion;Neuromuscular education;Electrical Stimulation;Manual Therapy   Plan cont to increase ROM , decrease scar tissue - assess sensation , AROM and strength   OT Home Exercise Plan see pt instruction   Consulted and Agree with Plan of Care Patient      Patient will benefit from skilled therapeutic intervention in order to improve the following deficits and impairments:  Decreased range of motion, Impaired flexibility, Decreased coordination, Decreased safety awareness, Increased edema, Impaired sensation, Decreased skin integrity, Decreased knowledge of precautions, Decreased knowledge of use of DME, Decreased scar mobility, Impaired UE functional use, Pain, Decreased strength, Impaired perceived functional ability  Visit Diagnosis: Stiffness of right hand, not elsewhere classified  Stiffness of right wrist, not elsewhere classified  Pain in right arm  Other disturbances of skin sensation  Scar condition and fibrosis of skin  Muscle weakness (generalized)    Problem List There are no active problems to display for this patient.   Rosalyn Gess OTR/L,CLT 09/27/2016, 2:09 PM  [facility] [address], [place], [address] Phone: [phone]   Fax:  [phone]  Name: Francisco Johns MRN: [id number] Date of Birth: 08-09-1975

## 2016-10-01 ENCOUNTER — Ambulatory Visit: Payer: 59 | Admitting: Occupational Therapy

## 2016-10-01 DIAGNOSIS — L905 Scar conditions and fibrosis of skin: Secondary | ICD-10-CM | POA: Diagnosis not present

## 2016-10-01 DIAGNOSIS — M6281 Muscle weakness (generalized): Secondary | ICD-10-CM | POA: Diagnosis not present

## 2016-10-01 DIAGNOSIS — M25631 Stiffness of right wrist, not elsewhere classified: Secondary | ICD-10-CM | POA: Diagnosis not present

## 2016-10-01 DIAGNOSIS — R208 Other disturbances of skin sensation: Secondary | ICD-10-CM | POA: Diagnosis not present

## 2016-10-01 DIAGNOSIS — M25641 Stiffness of right hand, not elsewhere classified: Secondary | ICD-10-CM | POA: Diagnosis not present

## 2016-10-01 DIAGNOSIS — M79601 Pain in right arm: Secondary | ICD-10-CM

## 2016-10-01 NOTE — Patient Instructions (Addendum)
Same HEP but change somewhat  Kinesiotape this date on ulnar aside , dorsal side and proximal end of graft - to decrease scar tissue - pt ed on precautions and extra provided for wife that is RN to replace- but take off at night the one at the proximal graft    Upgrade to Wrapped 1 lbs in hand and done wrist extention in horizontal plane - AROM and place and hold 12 reps - greater motion

## 2016-10-01 NOTE — Therapy (Signed)
[place] [facility] [address] [place], [address] Phone: [phone]   Fax:  [phone]  Occupational Therapy Treatment  Patient Details  Name: Francisco Johns MRN: [id number] Date of Birth: 1976-04-13 Referring Provider: Vernona Rieger  Encounter Date: 10/01/2016      OT End of Session - 10/01/16 1816    Visit Number 15   Number of Visits 24   Date for OT Re-Evaluation 11/21/16   OT Start Time 1058   OT Stop Time 1211   OT Time Calculation (min) 73 min   Activity Tolerance Patient tolerated treatment well   Behavior During Therapy [facility] for tasks assessed/performed      Past Medical History:  Diagnosis Date  . Metatarsal fracture    right big toe    Past Surgical History:  Procedure Laterality Date  . ankle surgery    . NO PAST SURGERIES    . ORIF TOE FRACTURE Right 12/09/2015   Procedure: OPEN REDUCTION INTERNAL FIXATION (ORIF) METATARSAL (TOE) FRACTURE, first metatarsal;  Surgeon: Renette Butters, MD;  Location: [place];  Service: Orthopedics;  Laterality: Right;    There were no vitals filed for this visit.      Subjective Assessment - 10/01/16 1119    Subjective  Did okay - the 3/4 lbs for my wrist getting easier - and then trying to take less Gabapenti   Patient Stated Goals I want as much movement and use I can get for my R dominant hand- to work , play with my kids, fish , play golf ,   Currently in Pain? Yes   Pain Score 4    Pain Location Hand   Pain Orientation Right   Pain Descriptors / Indicators Tingling   Pain Type Surgical pain                      OT Treatments/Exercises (OP) - 10/01/16 0001      Moist Heat Therapy   Number Minutes Moist Heat 10 Minutes   Moist Heat Location Hand  pronation stretch - and flexion coban wrap for 5th       Pt to Cont wear wrist cockup and webspacer at night time and during day on and off -  Anti claw splint more  stability to extend PIP after flexion  Cont with cica scar pad at night time and during day on and off - larger one provided if not kinesiotape on  Scar massage done to ulnar , dorsal -using graston tool nr 2 brushing  Kinesiotape this date on ulnar aside , dorsal side and proximal end of graft - to decrease scar tissue - pt ed on precautions and extra provided for wife that is RN to replace- but take off at night the one at the proximal graft    PROM for RD done -  4 kg ball bilateral hands for wrist RD and UD  12 reps  BTE CPM for wrist extention 200 sec Wrapped 1 lbs in hand and done wrist extention in horizontal plane - AROM and place and hold 12 reps - greater motion  D ring on BTE 2 lbs for sup 120 sec  And D ring for pronation 0 lbs 120 sec  - place and hold 75% of time   PROM for thumb PA/RA including extention of IP  PROM of all digits - with focus on 5th - composite  Estim done - done on neuro  rehab protocol for wrist extention -rolling over ball into extention with about 17 current               OT Education - 10/01/16 1816    Education provided Yes   Education Details 1 lbs for wrist extention and scar taping    Person(s) Educated Patient   Methods Explanation;Demonstration;Tactile cues;Verbal cues   Comprehension Verbal cues required;Returned demonstration;Verbalized understanding          OT Short Term Goals - 09/27/16 1405      OT SHORT TERM GOAL #1   Title Pt to be ind in HEP to use splints correctly , increase ROM and sensatin in R hand and wrist    Baseline Tinel in palm and digits , splint wearing    Time 3   Period Weeks   Status On-going     OT SHORT TERM GOAL #2   Title Fabricate/ modify or change splints as needed to prevent contractures in R hand and wrist while nerve healing occur   Baseline Webspace getting tight , IP of thumb flexor contracture - cont to assess if need to change   Time 3   Period Weeks   Status On-going      OT SHORT TERM GOAL #3   Title R wrist AROM and PROM improve with 10-15 degrees to be able carry object on palm, wipe table and reach with wrist extention to grip    Baseline RD 0, Ext 2, UD 15 flexion 46, pronation 40, Sup 60 - progressing see flowsheet   Time 4   Period Weeks   Status On-going     OT SHORT TERM GOAL #4   Title Sensation improve in R hand to identify  ojbects in palm 50% of time    Baseline Tinel in diigts and palm    Time 4   Period Weeks   Status On-going           OT Long Term Goals - 09/27/16 1406      OT LONG TERM GOAL #1   Title Upgrade HEP to increase PIP and MC flexion as nerve is healing to grasp 6 inch object to 3 inch object   Baseline see flowsheet    Time 4   Period Weeks   Status On-going     OT LONG TERM GOAL #2   Title Wrist ROM improve and strength for pt to intiate using 1 lbs weight for wrist in all planes    Baseline 3/4 lbs for wrist extention , 2 lbs for RD , and sup - not pronation    Time 6   Period Weeks   Status On-going     OT LONG TERM GOAL #3   Title Assess shoulder and upgrade HEP to decrease pain and compensation    Baseline HEP provided - pt report getting better    Time 4   Period Weeks   Status On-going               Plan - 10/01/16 1822    Clinical Impression Statement Pt cont to make progress in nerve healing with Thornell Mule this date - able to do 4.56 whole volar hand - and 4.31 in proximal thenar eminence , palm - but not on ulnar N - able to increase to 1 lbs this date for wrist extention without gravity    Occupational performance deficits (Please refer to evaluation for details): ADL's;IADL's;Play;Leisure   Rehab Potential Good   OT Frequency  2x / week   OT Duration 4 weeks   OT Treatment/Interventions Self-care/ADL training;Fluidtherapy;Splinting;Patient/family education;Therapeutic exercises;Ultrasound;Scar mobilization;Passive range of motion;Neuromuscular education;Electrical  Stimulation;Manual Therapy   Plan cont to upgrade HEP as able, assessprogress    OT Home Exercise Plan see pt instruction   Consulted and Agree with Plan of Care Patient      Patient will benefit from skilled therapeutic intervention in order to improve the following deficits and impairments:  Decreased range of motion, Impaired flexibility, Decreased coordination, Decreased safety awareness, Increased edema, Impaired sensation, Decreased skin integrity, Decreased knowledge of precautions, Decreased knowledge of use of DME, Decreased scar mobility, Impaired UE functional use, Pain, Decreased strength, Impaired perceived functional ability  Visit Diagnosis: Stiffness of right hand, not elsewhere classified  Stiffness of right wrist, not elsewhere classified  Pain in right arm  Other disturbances of skin sensation  Scar condition and fibrosis of skin  Muscle weakness (generalized)    Problem List There are no active problems to display for this patient.   Rosalyn Gess OTR/L,CLT 10/01/2016, 6:25 PM  [place] [facility] [address], [place], [address] Phone: [phone]   Fax:  [phone]  Name: Francisco Johns MRN: [id number] Date of Birth: 14-Jun-1975

## 2016-10-04 ENCOUNTER — Ambulatory Visit: Payer: 59 | Admitting: Occupational Therapy

## 2016-10-04 DIAGNOSIS — M25631 Stiffness of right wrist, not elsewhere classified: Secondary | ICD-10-CM

## 2016-10-04 DIAGNOSIS — M79601 Pain in right arm: Secondary | ICD-10-CM

## 2016-10-04 DIAGNOSIS — L905 Scar conditions and fibrosis of skin: Secondary | ICD-10-CM | POA: Diagnosis not present

## 2016-10-04 DIAGNOSIS — R208 Other disturbances of skin sensation: Secondary | ICD-10-CM

## 2016-10-04 DIAGNOSIS — M25641 Stiffness of right hand, not elsewhere classified: Secondary | ICD-10-CM | POA: Diagnosis not present

## 2016-10-04 DIAGNOSIS — M6281 Muscle weakness (generalized): Secondary | ICD-10-CM

## 2016-10-04 NOTE — Patient Instructions (Addendum)
Same  But Kinesiotape this date on ulnar aside to dorsal part and distal part of graft - at CT and wrist crease - pt ed on precautions and extra provided for wife that is RN to replace- but take off at night the one at distal  graft    And add  Sup and pronation wheel done with 1 1/2 lbs for assist - pt done 15 reps each and take home to do at home

## 2016-10-04 NOTE — Therapy (Signed)
[place] [facility] [address], [place], [address] Phone: [phone]   Fax:  [phone]  Occupational Therapy Treatment  Patient Details  Name: Francisco Johns MRN: [id number] Date of Birth: 1975-05-19 Referring Provider: Vernona Rieger  Encounter Date: 10/04/2016      OT End of Session - 10/04/16 1838    Visit Number 16   Number of Visits 24   Date for OT Re-Evaluation 11/21/16   OT Start Time 1330   OT Stop Time 1434   OT Time Calculation (min) 64 min   Activity Tolerance Patient tolerated treatment well   Behavior During Therapy [facility] for tasks assessed/performed      Past Medical History:  Diagnosis Date  . Metatarsal fracture    right big toe    Past Surgical History:  Procedure Laterality Date  . ankle surgery    . NO PAST SURGERIES    . ORIF TOE FRACTURE Right 12/09/2015   Procedure: OPEN REDUCTION INTERNAL FIXATION (ORIF) METATARSAL (TOE) FRACTURE, first metatarsal;  Surgeon: Renette Butters, MD;  Location: [place];  Service: Orthopedics;  Laterality: Right;    There were no vitals filed for this visit.      Subjective Assessment - 10/04/16 1345    Subjective  doing okay with exercises- did take some Gabapentin- had earlier a stabbing and shooting pain    Patient Stated Goals I want as much movement and use I can get for my R dominant hand- to work , play with my kids, fish , play golf ,   Currently in Pain? Yes   Pain Score 2    Pain Location Hand   Pain Orientation Right   Pain Descriptors / Indicators Stabbing;Shooting   Pain Type Surgical pain                      OT Treatments/Exercises (OP) - 10/04/16 0001      Moist Heat Therapy   Number Minutes Moist Heat 0 Minutes   Moist Heat Location Hand  SOC with last 5 min flexion wrap for 5th - to increase flexi      Pt to Cont wear wrist cockup and webspacer at night time and during day on and off -   Anti claw splint more stability to extend PIP after flexion  Cont with cica scar pad at night time and during day on and off - larger one provided if not kinesiotape on  Assess scar and where to apply kinesiotape Kinesiotape this date on ulnar aside to dorsal part and distal part of graft - at CT and wrist crease - pt ed on precautions and extra provided for wife that is RN to replace- but take off at night the one at distal  graft    PROM for RD done -  Large knob on BTE 0 lbs - 12 sec for RD and UD  Able to do 120  For RD without fatigue  Or thumb sliding   BTE CPM for wrist extention 200 sec Wrapped 1 lbs in hand and done wrist extention in horizontal plane - AROM and place and hold 12 reps - greater motion  Sup and pronation wheel done with 1 1/2 lbs for assist - pt done 15 reps each and take home to do at home   PROM for thumb PA/RA including extention of IP  PROM of all digits - with focus on 5th - composite Assess AROM  and place and hold of all digits - and compare to nerve healing   Estim done - done on neuro rehab protocol for wrist extention -rolling over ball into extention with about 17 current             OT Education - 10/04/16 1838    Education provided Yes   Education Details add sup and pron wheel    Person(s) Educated Patient   Methods Demonstration;Explanation;Tactile cues;Verbal cues   Comprehension Verbal cues required;Returned demonstration;Verbalized understanding          OT Short Term Goals - 09/27/16 1405      OT SHORT TERM GOAL #1   Title Pt to be ind in HEP to use splints correctly , increase ROM and sensatin in R hand and wrist    Baseline Tinel in palm and digits , splint wearing    Time 3   Period Weeks   Status On-going     OT SHORT TERM GOAL #2   Title Fabricate/ modify or change splints as needed to prevent contractures in R hand and wrist while nerve healing occur   Baseline Webspace getting tight , IP of thumb flexor  contracture - cont to assess if need to change   Time 3   Period Weeks   Status On-going     OT SHORT TERM GOAL #3   Title R wrist AROM and PROM improve with 10-15 degrees to be able carry object on palm, wipe table and reach with wrist extention to grip    Baseline RD 0, Ext 2, UD 15 flexion 46, pronation 40, Sup 60 - progressing see flowsheet   Time 4   Period Weeks   Status On-going     OT SHORT TERM GOAL #4   Title Sensation improve in R hand to identify  ojbects in palm 50% of time    Baseline Tinel in diigts and palm    Time 4   Period Weeks   Status On-going           OT Long Term Goals - 09/27/16 1406      OT LONG TERM GOAL #1   Title Upgrade HEP to increase PIP and MC flexion as nerve is healing to grasp 6 inch object to 3 inch object   Baseline see flowsheet    Time 4   Period Weeks   Status On-going     OT LONG TERM GOAL #2   Title Wrist ROM improve and strength for pt to intiate using 1 lbs weight for wrist in all planes    Baseline 3/4 lbs for wrist extention , 2 lbs for RD , and sup - not pronation    Time 6   Period Weeks   Status On-going     OT LONG TERM GOAL #3   Title Assess shoulder and upgrade HEP to decrease pain and compensation    Baseline HEP provided - pt report getting better    Time 4   Period Weeks   Status On-going               Plan - 10/04/16 1839    Clinical Impression Statement Pt cont to make progress slow but steady in ROM , nerve healing , strength- cont to decrease scar tissue and increase ROM as nerve healing occur    Occupational performance deficits (Please refer to evaluation for details): IADL's;Work;Play;Leisure;ADL's   Rehab Potential Good   OT Frequency 2x / week   OT Duration  4 weeks   OT Treatment/Interventions Self-care/ADL training;Fluidtherapy;Splinting;Patient/family education;Therapeutic exercises;Ultrasound;Scar mobilization;Passive range of motion;Neuromuscular education;Electrical Stimulation;Manual  Therapy   Plan upgrade HEP as needed    OT Home Exercise Plan see pt instruction   Consulted and Agree with Plan of Care Patient      Patient will benefit from skilled therapeutic intervention in order to improve the following deficits and impairments:  Decreased range of motion, Impaired flexibility, Decreased coordination, Decreased safety awareness, Increased edema, Impaired sensation, Decreased skin integrity, Decreased knowledge of precautions, Decreased knowledge of use of DME, Decreased scar mobility, Impaired UE functional use, Pain, Decreased strength, Impaired perceived functional ability  Visit Diagnosis: Stiffness of right hand, not elsewhere classified  Stiffness of right wrist, not elsewhere classified  Pain in right arm  Other disturbances of skin sensation  Scar condition and fibrosis of skin  Muscle weakness (generalized)    Problem List There are no active problems to display for this patient.   Rosalyn Gess OTR/L,CLT 10/04/2016, 6:41 PM  [place] [facility] [address] [place], [address] Phone: [phone]   Fax:  [phone]  Name: Francisco Johns MRN: [id number] Date of Birth: 01/11/1976

## 2016-10-10 ENCOUNTER — Ambulatory Visit: Payer: 59 | Admitting: Occupational Therapy

## 2016-10-10 DIAGNOSIS — M6281 Muscle weakness (generalized): Secondary | ICD-10-CM | POA: Diagnosis not present

## 2016-10-10 DIAGNOSIS — M25641 Stiffness of right hand, not elsewhere classified: Secondary | ICD-10-CM | POA: Diagnosis not present

## 2016-10-10 DIAGNOSIS — M25631 Stiffness of right wrist, not elsewhere classified: Secondary | ICD-10-CM | POA: Diagnosis not present

## 2016-10-10 DIAGNOSIS — R208 Other disturbances of skin sensation: Secondary | ICD-10-CM

## 2016-10-10 DIAGNOSIS — L905 Scar conditions and fibrosis of skin: Secondary | ICD-10-CM

## 2016-10-10 DIAGNOSIS — M79601 Pain in right arm: Secondary | ICD-10-CM | POA: Diagnosis not present

## 2016-10-10 NOTE — Patient Instructions (Signed)
Same HEP but changes taping Kinesiotape this date applied on proximal part of of graft - volar forearm - either side 30%  And across bottom part of graft where adhesions the worse  80% or more  - pt ed on precautions and extra provided for wife that is RN to replace- but take off at night the one at distal  graft   cont same HEP but add or change Pt to do 1 lbs at home for RD in standing with hand to side or supine and RD with gravity  Cuff weight  -but weight to be on dorsal hand   Cont with pronation and sup wheel - focus on pronation

## 2016-10-10 NOTE — Therapy (Signed)
[place] [facility] [address], [place], [address] Phone: [phone]   Fax:  [phone]  Occupational Therapy Treatment  Patient Details  Name: Francisco Johns MRN: [id number] Date of Birth: May 26, 1975 Referring Provider: Vernona Rieger  Encounter Date: 10/10/2016      OT End of Session - 10/10/16 1752    Visit Number 17   Number of Visits 24   Date for OT Re-Evaluation 11/21/16   OT Start Time 0933   OT Stop Time 1045   OT Time Calculation (min) 72 min   Activity Tolerance Patient tolerated treatment well   Behavior During Therapy [facility] for tasks assessed/performed      Past Medical History:  Diagnosis Date  . Metatarsal fracture    right big toe    Past Surgical History:  Procedure Laterality Date  . ankle surgery    . NO PAST SURGERIES    . ORIF TOE FRACTURE Right 12/09/2015   Procedure: OPEN REDUCTION INTERNAL FIXATION (ORIF) METATARSAL (TOE) FRACTURE, first metatarsal;  Surgeon: Renette Butters, MD;  Location: [place];  Service: Orthopedics;  Laterality: Right;    There were no vitals filed for this visit.      Subjective Assessment - 10/10/16 1744    Subjective  Have some nerve pains but not bad- doing okay with HEP - wearing splint and thumb webspacer at times - do not see Dr Serafina Royals until about 13th July    Patient Stated Goals I want as much movement and use I can get for my R dominant hand- to work , play with my kids, fish , play golf ,   Currently in Pain? Yes   Pain Score 2    Pain Location Hand   Pain Orientation Right   Pain Descriptors / Indicators Shooting;Tingling;Pins and needles   Pain Type Surgical pain   Pain Onset More than a month ago                      OT Treatments/Exercises (OP) - 10/10/16 0001      Moist Heat Therapy   Number Minutes Moist Heat 10 Minutes   Moist Heat Location Hand  coban 4th and 5th into flexion last 5 min for flexion  streth    Cont with cica scar pad at night time and during day on and off - larger one provided if not kinesiotape on  Assess scar and where to apply kinesiotape Kinesiotape this date applied on proximal part of of graft - volar forearm - either side 30%  And across bottom part of graft where adhesions the worse  80% or more  - pt ed on precautions and extra provided for wife that is RN to replace- but take off at night the one at distal  graft   Scar mobs done this date on volar wrist and forearm - graston tool nr 2 used around graft for brushing and sweeping - but not on graft - gentle around   PROM for RD done  Large knob on BTE 1 lbs - 120 sec for RD and UD  Able to do 120  For RD without fatigue  Or thumb sliding  If pt focus at 100 sec Pt to do 1 lbs at home for RD in standing with hand to side or supine and RD with gravity  Cuff weight  -but weight to be on dorsal hand   BTE CPM for wrist extention  200 sec PROM for thumb PA/RA including extention of IP  PROM of all digits - with focus on 5th - composite  Estim done - done on neuro rehab protocol for wrist extention -rolling over ball into extention with about 17 current  Place mid and proximal forearm - dorsally             OT Education - 10/10/16 1752    Education provided Yes   Education Details change taping and RD , UD    Person(s) Educated Patient   Methods Demonstration;Tactile cues;Verbal cues   Comprehension Verbal cues required;Returned demonstration;Verbalized understanding          OT Short Term Goals - 09/27/16 1405      OT SHORT TERM GOAL #1   Title Pt to be ind in HEP to use splints correctly , increase ROM and sensatin in R hand and wrist    Baseline Tinel in palm and digits , splint wearing    Time 3   Period Weeks   Status On-going     OT SHORT TERM GOAL #2   Title Fabricate/ modify or change splints as needed to prevent contractures in R hand and wrist while nerve healing occur    Baseline Webspace getting tight , IP of thumb flexor contracture - cont to assess if need to change   Time 3   Period Weeks   Status On-going     OT SHORT TERM GOAL #3   Title R wrist AROM and PROM improve with 10-15 degrees to be able carry object on palm, wipe table and reach with wrist extention to grip    Baseline RD 0, Ext 2, UD 15 flexion 46, pronation 40, Sup 60 - progressing see flowsheet   Time 4   Period Weeks   Status On-going     OT SHORT TERM GOAL #4   Title Sensation improve in R hand to identify  ojbects in palm 50% of time    Baseline Tinel in diigts and palm    Time 4   Period Weeks   Status On-going           OT Long Term Goals - 09/27/16 1406      OT LONG TERM GOAL #1   Title Upgrade HEP to increase PIP and MC flexion as nerve is healing to grasp 6 inch object to 3 inch object   Baseline see flowsheet    Time 4   Period Weeks   Status On-going     OT LONG TERM GOAL #2   Title Wrist ROM improve and strength for pt to intiate using 1 lbs weight for wrist in all planes    Baseline 3/4 lbs for wrist extention , 2 lbs for RD , and sup - not pronation    Time 6   Period Weeks   Status On-going     OT LONG TERM GOAL #3   Title Assess shoulder and upgrade HEP to decrease pain and compensation    Baseline HEP provided - pt report getting better    Time 4   Period Weeks   Status On-going               Plan - 10/10/16 1753    Clinical Impression Statement Pt was able to do more resistance for UD , RD and pronation /sup on BTE this date - cont to maintain and increase ROM , increase strength - scar mobs and monitor nerve healing    Occupational  performance deficits (Please refer to evaluation for details): IADL's;Work;Play;Leisure;ADL's   Rehab Potential Good   OT Frequency 2x / week   OT Duration 4 weeks   OT Treatment/Interventions Self-care/ADL training;Fluidtherapy;Splinting;Patient/family education;Therapeutic exercises;Ultrasound;Scar  mobilization;Passive range of motion;Neuromuscular education;Electrical Stimulation;Manual Therapy   Plan assess HEP progress    OT Home Exercise Plan see pt instruction   Consulted and Agree with Plan of Care Patient      Patient will benefit from skilled therapeutic intervention in order to improve the following deficits and impairments:  Decreased range of motion, Impaired flexibility, Decreased coordination, Decreased safety awareness, Increased edema, Impaired sensation, Decreased skin integrity, Decreased knowledge of precautions, Decreased knowledge of use of DME, Decreased scar mobility, Impaired UE functional use, Pain, Decreased strength, Impaired perceived functional ability  Visit Diagnosis: Stiffness of right hand, not elsewhere classified  Stiffness of right wrist, not elsewhere classified  Pain in right arm  Other disturbances of skin sensation  Scar condition and fibrosis of skin  Muscle weakness (generalized)    Problem List There are no active problems to display for this patient.   Rosalyn Gess OTR/L,CLT 10/10/2016, 5:55 PM  [place] [facility] [address], [place], [address] Phone: [phone]   Fax:  [phone]  Name: Francisco Johns MRN: [id number] Date of Birth: 1975-06-12

## 2016-10-12 ENCOUNTER — Ambulatory Visit: Payer: 59 | Admitting: Occupational Therapy

## 2016-10-12 DIAGNOSIS — R208 Other disturbances of skin sensation: Secondary | ICD-10-CM | POA: Diagnosis not present

## 2016-10-12 DIAGNOSIS — M25641 Stiffness of right hand, not elsewhere classified: Secondary | ICD-10-CM

## 2016-10-12 DIAGNOSIS — M25631 Stiffness of right wrist, not elsewhere classified: Secondary | ICD-10-CM | POA: Diagnosis not present

## 2016-10-12 DIAGNOSIS — M79601 Pain in right arm: Secondary | ICD-10-CM | POA: Diagnosis not present

## 2016-10-12 DIAGNOSIS — L905 Scar conditions and fibrosis of skin: Secondary | ICD-10-CM

## 2016-10-12 DIAGNOSIS — M6281 Muscle weakness (generalized): Secondary | ICD-10-CM | POA: Diagnosis not present

## 2016-10-12 NOTE — Therapy (Signed)
[facility] [address], [place], [address] Phone: [phone]   Fax:  [phone]  Occupational Therapy Treatment  Patient Details  Name: Francisco Johns MRN: [id number] Date of Birth: 08-13-1975 Referring Provider: Vernona Rieger  Encounter Date: 10/12/2016      OT End of Session - 10/12/16 1305    Visit Number 18   Number of Visits 24   Date for OT Re-Evaluation 11/21/16   OT Start Time 0932   OT Stop Time 1035   OT Time Calculation (min) 63 min   Activity Tolerance Patient tolerated treatment well   Behavior During Therapy [facility] for tasks assessed/performed      Past Medical History:  Diagnosis Date  . Metatarsal fracture    right big toe    Past Surgical History:  Procedure Laterality Date  . ankle surgery    . NO PAST SURGERIES    . ORIF TOE FRACTURE Right 12/09/2015   Procedure: OPEN REDUCTION INTERNAL FIXATION (ORIF) METATARSAL (TOE) FRACTURE, first metatarsal;  Surgeon: Renette Butters, MD;  Location: [facility];  Service: Orthopedics;  Laterality: Right;    There were no vitals filed for this visit.      Subjective Assessment - 10/12/16 1001    Subjective  Doing okay - did the exercises- and splints - did the scar tape - took off this am    Patient Stated Goals I want as much movement and use I can get for my R dominant hand- to work , play with my kids, fish , play golf ,   Currently in Pain? Yes   Pain Score 2    Pain Location Head   Pain Orientation Right   Pain Descriptors / Indicators Shooting;Tingling;Pins and needles   Pain Type Surgical pain                      OT Treatments/Exercises (OP) - 10/12/16 0001      Moist Heat Therapy   Number Minutes Moist Heat 10 Minutes   Moist Heat Location Hand  flexion wrap 5th digit last 5 min       Cont with cica scar pad at night time and during day on and off - larger one provided if not kinesiotape on   Assess scar and where to apply kinesiotape Kinesiotape this date applied on proximal of volar graft  - only tight little scar  - did start taping  And then on proximal part of volar scar /graft - did one across at 90% with either side parallel 30% - for proximal adhesion at graft - pt ed on precautions and extra provided for wife that is RN to replace- but take off at night   Scar mobs done this date  Proximal volar scar - with vibration 10 reps 3 sec- one small thick one- with good results   PROM for RD done  In supine with 1 1/2 lbs cuff weight RD - with stretch and place and hold UD  10 reps  CPM for wrist extention  200 sec  faciliate wrist extention with cuff weight 1 1/2 lbs - attemp slide on table, paper - and folder with slope 10 reps  Large knob on BTE 1 lbs - 120 sec for RD   Able to do 120 For RD without fatigue Or thumb sliding    PROM for thumb PA/RA including extention of IP  PROM of all digits - with  focus on 5th - composite AROM and place and hold for thumb PA and RA  Pt to do at home 8 reps each   BTE sup 120 sec at 2 lbs  Pronation 120 sec 1 lbs  And repeat 80 sec  First 10 reps end range PROM   Estim done - done on neuro rehab protocol for wrist extention -rolling over ball into extention with about 17 current  Place mid and proximal forearm - dorsally            OT Education - 10/12/16 1305    Education provided Yes   Education Details up weight to 1 1/2 lbs - cuff weight - thumb to focus on    Person(s) Educated Patient   Methods Explanation;Demonstration;Tactile cues;Verbal cues;Handout   Comprehension Verbalized understanding;Returned demonstration;Verbal cues required          OT Short Term Goals - 09/27/16 1405      OT SHORT TERM GOAL #1   Title Pt to be ind in HEP to use splints correctly , increase ROM and sensatin in R hand and wrist    Baseline Tinel in palm and digits , splint wearing    Time 3   Period Weeks   Status  On-going     OT SHORT TERM GOAL #2   Title Fabricate/ modify or change splints as needed to prevent contractures in R hand and wrist while nerve healing occur   Baseline Webspace getting tight , IP of thumb flexor contracture - cont to assess if need to change   Time 3   Period Weeks   Status On-going     OT SHORT TERM GOAL #3   Title R wrist AROM and PROM improve with 10-15 degrees to be able carry object on palm, wipe table and reach with wrist extention to grip    Baseline RD 0, Ext 2, UD 15 flexion 46, pronation 40, Sup 60 - progressing see flowsheet   Time 4   Period Weeks   Status On-going     OT SHORT TERM GOAL #4   Title Sensation improve in R hand to identify  ojbects in palm 50% of time    Baseline Tinel in diigts and palm    Time 4   Period Weeks   Status On-going           OT Long Term Goals - 09/27/16 1406      OT LONG TERM GOAL #1   Title Upgrade HEP to increase PIP and MC flexion as nerve is healing to grasp 6 inch object to 3 inch object   Baseline see flowsheet    Time 4   Period Weeks   Status On-going     OT LONG TERM GOAL #2   Title Wrist ROM improve and strength for pt to intiate using 1 lbs weight for wrist in all planes    Baseline 3/4 lbs for wrist extention , 2 lbs for RD , and sup - not pronation    Time 6   Period Weeks   Status On-going     OT LONG TERM GOAL #3   Title Assess shoulder and upgrade HEP to decrease pain and compensation    Baseline HEP provided - pt report getting better    Time 4   Period Weeks   Status On-going               Plan - 10/12/16 1306    Clinical Impression Statement Pt cont  to make progress - pt able to use thumb ABD and extensor brevis - place and hold - tight at IP of thumb - able to increase this date to 1 1/2 lbs for wrist - but need some facilitation to over come volar scar for extention end range    Occupational performance deficits (Please refer to evaluation for details):  ADL's;IADL's;Work;Play;Leisure   Rehab Potential Good   OT Frequency 2x / week   OT Duration 4 weeks   Plan cont to upgrade HEP    Clinical Decision Making Multiple treatment options, significant modification of task necessary   OT Home Exercise Plan see pt instruction   Consulted and Agree with Plan of Care Patient      Patient will benefit from skilled therapeutic intervention in order to improve the following deficits and impairments:  Decreased range of motion, Impaired flexibility, Decreased coordination, Decreased safety awareness, Increased edema, Impaired sensation, Decreased skin integrity, Decreased knowledge of precautions, Decreased knowledge of use of DME, Decreased scar mobility, Impaired UE functional use, Pain, Decreased strength, Impaired perceived functional ability  Visit Diagnosis: Stiffness of right hand, not elsewhere classified  Stiffness of right wrist, not elsewhere classified  Pain in right arm  Other disturbances of skin sensation  Scar condition and fibrosis of skin  Muscle weakness (generalized)    Problem List There are no active problems to display for this patient.   Rosalyn Gess OTR/L,CLT 10/12/2016, 1:08 PM  [facility] [address], [place], [address] Phone: [phone]   Fax:  [phone]  Name: REMY VOILES MRN: [id number] Date of Birth: 13-Oct-1975

## 2016-10-12 NOTE — Patient Instructions (Addendum)
  Kinesiotape this date applied on proximal of volar graft  - only tight little scar  - did start taping  And then on proximal part of volar scar /graft - did one across at 90% with either side parallel 30% - for proximal adhesion at graft - pt ed on precautions and extra provided for wife that is RN to replace- but take off at night    In supine with 1 1/2 lbs cuff weight RD - with stretch and place and hold UD  10 reps  CPM for wrist extention  200 sec  faciliate wrist extention with cuff weight 1 1/2 lbs - attemp slide on table, paper - and folder with slope 10 reps  RD /UD with 1 lbs at side 10 reps    AROM and place and hold for thumb PA and RA  Pt to do at home 8 reps each

## 2016-10-16 ENCOUNTER — Ambulatory Visit: Payer: 59 | Admitting: Occupational Therapy

## 2016-10-16 DIAGNOSIS — M25641 Stiffness of right hand, not elsewhere classified: Secondary | ICD-10-CM | POA: Diagnosis not present

## 2016-10-16 DIAGNOSIS — L905 Scar conditions and fibrosis of skin: Secondary | ICD-10-CM | POA: Diagnosis not present

## 2016-10-16 DIAGNOSIS — M79601 Pain in right arm: Secondary | ICD-10-CM | POA: Diagnosis not present

## 2016-10-16 DIAGNOSIS — M25631 Stiffness of right wrist, not elsewhere classified: Secondary | ICD-10-CM | POA: Diagnosis not present

## 2016-10-16 DIAGNOSIS — R208 Other disturbances of skin sensation: Secondary | ICD-10-CM | POA: Diagnosis not present

## 2016-10-16 DIAGNOSIS — M6281 Muscle weakness (generalized): Secondary | ICD-10-CM

## 2016-10-16 NOTE — Patient Instructions (Addendum)
Same HEP  

## 2016-10-16 NOTE — Therapy (Signed)
[facility] [address] [place], [address] Phone: [phone]   Fax:  [phone]  Occupational Therapy Treatment  Patient Details  Name: Francisco Johns MRN: [id number] Date of Birth: [birth year] Referring Provider: Vernona Rieger  Encounter Date: 10/16/2016      OT End of Session - 10/16/16 1627    Visit Number 20   Number of Visits 24   Date for OT Re-Evaluation 11/21/16   OT Start Time 1023   OT Stop Time 1130   OT Time Calculation (min) 67 min   Activity Tolerance Patient tolerated treatment well   Behavior During Therapy [facility] for tasks assessed/performed      Past Medical History:  Diagnosis Date  . Metatarsal fracture    right big toe    Past Surgical History:  Procedure Laterality Date  . ankle surgery    . NO PAST SURGERIES    . ORIF TOE FRACTURE Right 12/09/2015   Procedure: OPEN REDUCTION INTERNAL FIXATION (ORIF) METATARSAL (TOE) FRACTURE, first metatarsal;  Surgeon: Renette Butters, MD;  Location: [facility];  Service: Orthopedics;  Laterality: Right;    There were no vitals filed for this visit.      Subjective Assessment - 10/16/16 1024    Subjective  The tape did okay - that pinkie stay stiff- seeing Dr Molar 11th , and ortho end of next month - my next little sore last night - I need to do my shoulder exercises more    Patient Stated Goals I want as much movement and use I can get for my R dominant hand- to work , play with my kids, fish , play golf ,   Currently in Pain? Yes   Pain Score 2    Pain Location Head   Pain Orientation Right   Pain Descriptors / Indicators Shooting                      OT Treatments/Exercises (OP) - 10/16/16 0001      Moist Heat Therapy   Number Minutes Moist Heat 10 Minutes   Moist Heat Location Hand    Cont with cica scar pad at night time and during day on and off - larger one provided if not kinesiotape on   Assess scar and where to apply kinesiotape Kinesiotape this date applied on distal part of graft and  Proximal 1/3 of  volar graft - did one across at 90% with either side parallel 30% - for proximal adhesion at graft - pt ed on precautions and extra provided for wife that is RN to replace- but take off at night   Scar mobs done this date  Proximal volar scar - with vibration 10 reps 3 sec- one small thick one- with good results but did get little blister - pt to do neosporin and keep band-aid   Large screwdriver with coban around and hand wrap to it - able to do 120 sec 0 lbs - wrist extention  Then repeat 1lbs - same but able to only do 70 sec   Large knob on BTE 1 lbs - 120 sec for RD   Able to do 120 For RD without fatigue Or thumb sliding   PROM for thumb PA/RA including extention of IP  PROM of all digits - with focus on 5th - composite AROM and place and hold for thumb PA and RA  Pt to do at home 8 reps  each   BTE sup 120 sec at 2 lbs  Pronation 120 sec  l2bs  Coban hand to D ring this date - no Benik wrist splint and pain less than 1/10  First 10 reps end range PROM on both   Estim done - done on neuro rehab protocol for wrist extention -rolling over ball into extention with about 17 current  Place mid and proximal forearm - dorsally              OT Education - 10/16/16 1624    Education provided Yes   Education Details Thumb PA and cont 1 1/2 lbs for wrist extention    Person(s) Educated Patient   Methods Explanation;Demonstration;Tactile cues;Verbal cues   Comprehension Verbalized understanding;Returned demonstration;Verbal cues required          OT Short Term Goals - 09/27/16 1405      OT SHORT TERM GOAL #1   Title Pt to be ind in HEP to use splints correctly , increase ROM and sensatin in R hand and wrist    Baseline Tinel in palm and digits , splint wearing    Time 3   Period Weeks   Status On-going     OT SHORT TERM GOAL #2   Title  Fabricate/ modify or change splints as needed to prevent contractures in R hand and wrist while nerve healing occur   Baseline Webspace getting tight , IP of thumb flexor contracture - cont to assess if need to change   Time 3   Period Weeks   Status On-going     OT SHORT TERM GOAL #3   Title R wrist AROM and PROM improve with 10-15 degrees to be able carry object on palm, wipe table and reach with wrist extention to grip    Baseline RD 0, Ext 2, UD 15 flexion 46, pronation 40, Sup 60 - progressing see flowsheet   Time 4   Period Weeks   Status On-going     OT SHORT TERM GOAL #4   Title Sensation improve in R hand to identify  ojbects in palm 50% of time    Baseline Tinel in diigts and palm    Time 4   Period Weeks   Status On-going           OT Long Term Goals - 09/27/16 1406      OT LONG TERM GOAL #1   Title Upgrade HEP to increase PIP and MC flexion as nerve is healing to grasp 6 inch object to 3 inch object   Baseline see flowsheet    Time 4   Period Weeks   Status On-going     OT LONG TERM GOAL #2   Title Wrist ROM improve and strength for pt to intiate using 1 lbs weight for wrist in all planes    Baseline 3/4 lbs for wrist extention , 2 lbs for RD , and sup - not pronation    Time 6   Period Weeks   Status On-going     OT LONG TERM GOAL #3   Title Assess shoulder and upgrade HEP to decrease pain and compensation    Baseline HEP provided - pt report getting better    Time 4   Period Weeks   Status On-going               Plan - 10/16/16 1627    Clinical Impression Statement Pt making progress in thumb PA and less flexion tightness in thumb  IP -albe to do wrist extention this date on BTE - if hand wrapped - able to upgrade wrist to 1-2 lbs in all planes - scar adhesion still limiting pt  wrist ROM - ulnar N healing appear to be later than radial and medial    Occupational performance deficits (Please refer to evaluation for details):  ADL's;IADL's;Play;Leisure   Rehab Potential Good   OT Frequency 2x / week   OT Duration 2 weeks   OT Treatment/Interventions Self-care/ADL training;Fluidtherapy;Splinting;Patient/family education;Therapeutic exercises;Ultrasound;Scar mobilization;Passive range of motion;Neuromuscular education;Electrical Stimulation;Manual Therapy   Plan cont upgrade as albe    Clinical Decision Making Multiple treatment options, significant modification of task necessary   OT Home Exercise Plan see pt instruction   Consulted and Agree with Plan of Care Patient      Patient will benefit from skilled therapeutic intervention in order to improve the following deficits and impairments:  Decreased range of motion, Impaired flexibility, Decreased coordination, Decreased safety awareness, Increased edema, Impaired sensation, Decreased skin integrity, Decreased knowledge of precautions, Decreased knowledge of use of DME, Decreased scar mobility, Impaired UE functional use, Pain, Decreased strength, Impaired perceived functional ability  Visit Diagnosis: Stiffness of right hand, not elsewhere classified  Stiffness of right wrist, not elsewhere classified  Pain in right arm  Other disturbances of skin sensation  Scar condition and fibrosis of skin  Muscle weakness (generalized)    Problem List There are no active problems to display for this patient.   Rosalyn Gess OTR/L,CLT 10/16/2016, 4:30 PM  [facility] [address] [place], [address] Phone: [phone]   Fax:  [phone]  Name: JASAIAH KARWOWSKI MRN: [id number] Date of Birth: [birth year]

## 2016-10-18 ENCOUNTER — Ambulatory Visit: Payer: 59 | Admitting: Occupational Therapy

## 2016-10-18 DIAGNOSIS — M79601 Pain in right arm: Secondary | ICD-10-CM

## 2016-10-18 DIAGNOSIS — L905 Scar conditions and fibrosis of skin: Secondary | ICD-10-CM | POA: Diagnosis not present

## 2016-10-18 DIAGNOSIS — R208 Other disturbances of skin sensation: Secondary | ICD-10-CM

## 2016-10-18 DIAGNOSIS — M6281 Muscle weakness (generalized): Secondary | ICD-10-CM | POA: Diagnosis not present

## 2016-10-18 DIAGNOSIS — M25641 Stiffness of right hand, not elsewhere classified: Secondary | ICD-10-CM

## 2016-10-18 DIAGNOSIS — M25631 Stiffness of right wrist, not elsewhere classified: Secondary | ICD-10-CM

## 2016-10-18 NOTE — Patient Instructions (Addendum)
Same hPE

## 2016-10-18 NOTE — Therapy (Signed)
[facility] [address], [place], [address] Phone: [phone]   Fax:  [phone]  Occupational Therapy Treatment  Patient Details  Name: Francisco Johns MRN: [id number] Date of Birth: October 22, 1975 Referring Provider: Vernona Rieger  Encounter Date: 10/18/2016      OT End of Session - 10/18/16 0821    Visit Number 21   Number of Visits 24   Date for OT Re-Evaluation 11/21/16   OT Start Time 0803   OT Stop Time 0906   OT Time Calculation (min) 63 min   Activity Tolerance Patient tolerated treatment well   Behavior During Therapy [facility] for tasks assessed/performed      Past Medical History:  Diagnosis Date  . Metatarsal fracture    right big toe    Past Surgical History:  Procedure Laterality Date  . ankle surgery    . NO PAST SURGERIES    . ORIF TOE FRACTURE Right 12/09/2015   Procedure: OPEN REDUCTION INTERNAL FIXATION (ORIF) METATARSAL (TOE) FRACTURE, first metatarsal;  Surgeon: Renette Butters, MD;  Location: [facility];  Service: Orthopedics;  Laterality: Right;    There were no vitals filed for this visit.      Subjective Assessment - 10/18/16 0816    Subjective  little area closing and doing neosporin - did the exericises -   Patient Stated Goals I want as much movement and use I can get for my R dominant hand- to work , play with my kids, fish , play golf ,   Currently in Pain? Yes   Pain Score 2    Pain Location Hand   Pain Orientation Right   Pain Descriptors / Indicators Pins and needles;Shooting   Pain Type Surgical pain    assess scar adhesions - if can tap with digits    Cont with cica scar pad at night time and during day on and off - larger one provided if not kinesiotape on  Assess scar and where to apply kinesiotape Kinesiotape this date applied on distal  1/3 part of graft  Where appear MC flexion adhere  and  Proximal 1/3 of  volar graft - did one across at 90%  with either side parallel 30% - for proximal adhesion at graft limiting wrist ROM  - pt ed on precautions and extra provided for wife that is RN to replace- but take off at night  Small pen size blister closing and healing great at end of proximal scar   CPM for wrist extention 200 sec Large screwdriver with coban around and hand wrap to it - able to do 120 sec 0 lbs - wrist extention - add 1 lbs for last 30 sec PROM for RD done 15 reps prior to   Large knob on BTE 2 lbs - 120 sec for RD  and UD    PROM for thumb PA/RA including extention of IP  PROM of all digits - with focus on 5th - composite AROM and place and hold for thumb PA and RA  Pt to do at home 8 reps each  Partial range and place and hold with rubber band for PA and RA   BTE sup 120 sec at 2 lbs  Pronation 120 sec  2lbs  no Benik wrist splint and pain less than 1/10  First 10 reps end range PROM on both   Estim done - done on neuro rehab protocol for wrist extention -rolling over ball into  extention with about 15.5 current  Place mid and proximal forearm - dorsally                    OT Treatments/Exercises (OP) - 10/18/16 0001      Moist Heat Therapy   Number Minutes Moist Heat 10 Minutes   Moist Heat Location Hand  coban wrap into flexion 5th                 OT Education - 10/18/16 0821    Education provided Yes   Person(s) Educated Patient   Methods Explanation;Demonstration;Tactile cues   Comprehension Verbalized understanding;Returned demonstration          OT Short Term Goals - 09/27/16 1405      OT SHORT TERM GOAL #1   Title Pt to be ind in HEP to use splints correctly , increase ROM and sensatin in R hand and wrist    Baseline Tinel in palm and digits , splint wearing    Time 3   Period Weeks   Status On-going     OT SHORT TERM GOAL #2   Title Fabricate/ modify or change splints as needed to prevent contractures in R hand and wrist while nerve healing occur    Baseline Webspace getting tight , IP of thumb flexor contracture - cont to assess if need to change   Time 3   Period Weeks   Status On-going     OT SHORT TERM GOAL #3   Title R wrist AROM and PROM improve with 10-15 degrees to be able carry object on palm, wipe table and reach with wrist extention to grip    Baseline RD 0, Ext 2, UD 15 flexion 46, pronation 40, Sup 60 - progressing see flowsheet   Time 4   Period Weeks   Status On-going     OT SHORT TERM GOAL #4   Title Sensation improve in R hand to identify  ojbects in palm 50% of time    Baseline Tinel in diigts and palm    Time 4   Period Weeks   Status On-going           OT Long Term Goals - 09/27/16 1406      OT LONG TERM GOAL #1   Title Upgrade HEP to increase PIP and MC flexion as nerve is healing to grasp 6 inch object to 3 inch object   Baseline see flowsheet    Time 4   Period Weeks   Status On-going     OT LONG TERM GOAL #2   Title Wrist ROM improve and strength for pt to intiate using 1 lbs weight for wrist in all planes    Baseline 3/4 lbs for wrist extention , 2 lbs for RD , and sup - not pronation    Time 6   Period Weeks   Status On-going     OT LONG TERM GOAL #3   Title Assess shoulder and upgrade HEP to decrease pain and compensation    Baseline HEP provided - pt report getting better    Time 4   Period Weeks   Status On-going               Plan - 10/18/16 0954    Clinical Impression Statement Pt showing increase strength in wrist - on BTE able to do 2 lbs for wrist RD/UD, sup/pro - started wrist extention this week  - still limited by scar adhesion and nerve healing  for digits and wrist ROM - cont kinesiotape -but pt to monitor skin integrety   Occupational performance deficits (Please refer to evaluation for details): ADL's;IADL's;Play;Leisure   Rehab Potential Good   OT Frequency 2x / week   OT Duration 2 weeks   OT Treatment/Interventions Self-care/ADL  training;Fluidtherapy;Splinting;Patient/family education;Therapeutic exercises;Ultrasound;Scar mobilization;Passive range of motion;Neuromuscular education;Electrical Stimulation;Manual Therapy   Plan cont to upgrade as nerve and scar healing    Clinical Decision Making Multiple treatment options, significant modification of task necessary   OT Home Exercise Plan see pt instruction   Consulted and Agree with Plan of Care Patient      Patient will benefit from skilled therapeutic intervention in order to improve the following deficits and impairments:  Decreased range of motion, Impaired flexibility, Decreased coordination, Decreased safety awareness, Increased edema, Impaired sensation, Decreased skin integrity, Decreased knowledge of precautions, Decreased knowledge of use of DME, Decreased scar mobility, Impaired UE functional use, Pain, Decreased strength, Impaired perceived functional ability  Visit Diagnosis: Stiffness of right wrist, not elsewhere classified  Stiffness of right hand, not elsewhere classified  Pain in right arm  Other disturbances of skin sensation  Scar condition and fibrosis of skin  Muscle weakness (generalized)    Problem List There are no active problems to display for this patient.   Rosalyn Gess OTR/L,CLT 10/18/2016, 9:58 AM  [facility] [address], [place], [address] Phone: [phone]   Fax:  [phone]  Name: QUANTE PETTRY MRN: [id number] Date of Birth: 1976-02-13

## 2016-10-22 ENCOUNTER — Ambulatory Visit: Payer: 59 | Attending: Plastic Surgery | Admitting: Occupational Therapy

## 2016-10-22 DIAGNOSIS — M25631 Stiffness of right wrist, not elsewhere classified: Secondary | ICD-10-CM | POA: Insufficient documentation

## 2016-10-22 DIAGNOSIS — L905 Scar conditions and fibrosis of skin: Secondary | ICD-10-CM | POA: Insufficient documentation

## 2016-10-22 DIAGNOSIS — M25641 Stiffness of right hand, not elsewhere classified: Secondary | ICD-10-CM | POA: Insufficient documentation

## 2016-10-22 DIAGNOSIS — R208 Other disturbances of skin sensation: Secondary | ICD-10-CM | POA: Insufficient documentation

## 2016-10-22 DIAGNOSIS — M6281 Muscle weakness (generalized): Secondary | ICD-10-CM | POA: Diagnosis not present

## 2016-10-22 DIAGNOSIS — M79601 Pain in right arm: Secondary | ICD-10-CM | POA: Insufficient documentation

## 2016-10-22 NOTE — Therapy (Signed)
[facility] [address], [place], [address] Phone: [phone]   Fax:  [phone]  Occupational Therapy Treatment  Patient Details  Name: Francisco Johns MRN: [id number] Date of Birth: Oct 03, 1975 Referring Provider: Vernona Rieger  Encounter Date: 10/22/2016      OT End of Session - 10/22/16 0859    Visit Number 22   Number of Visits 24   Date for OT Re-Evaluation 11/21/16   OT Start Time 0806   OT Stop Time 0918   OT Time Calculation (min) 72 min   Activity Tolerance Patient tolerated treatment well   Behavior During Therapy [facility] for tasks assessed/performed      Past Medical History:  Diagnosis Date  . Metatarsal fracture    right big toe    Past Surgical History:  Procedure Laterality Date  . ankle surgery    . NO PAST SURGERIES    . ORIF TOE FRACTURE Right 12/09/2015   Procedure: OPEN REDUCTION INTERNAL FIXATION (ORIF) METATARSAL (TOE) FRACTURE, first metatarsal;  Surgeon: Renette Butters, MD;  Location: [place];  Service: Orthopedics;  Laterality: Right;    There were no vitals filed for this visit.      Subjective Assessment - 10/22/16 0819    Subjective  I can tell it is getting stronger , using 1 1/2 lbs at the wrist and did the thumb - tape did okay    Patient Stated Goals I want as much movement and use I can get for my R dominant hand- to work , play with my kids, fish , play golf ,   Currently in Pain? Yes   Pain Score 2    Pain Location Hand   Pain Orientation Right   Pain Descriptors / Indicators Tingling;Pins and needles   Pain Type Surgical pain            [facility] OT Assessment - 10/22/16 0001      Right Hand AROM   R Index  MCP 0-90 --  0-60   R Index PIP 0-100 --  -40 to 95   R Long  MCP 0-90 --  0-60   R Long PIP 0-100 --  -30 to 90   R Ring  MCP 0-90 --  0-60   R Ring PIP 0-100 --  -20 to 95   R Little  MCP 0-90 --  0-30                   OT Treatments/Exercises (OP) - 10/22/16 0001      Moist Heat Therapy   Number Minutes Moist Heat 10 Minutes   Moist Heat Location Hand  coban wrap to 4th and 5th into digits       assess scar adhesions with wrist flexion , digits AROM with resistance   Cont with cica scar pad at night time and during day on and off - larger one provided if not kinesiotape on  Assess scar and where to apply kinesiotape Kinesiotape this date applied on distal  1/3 part of graft  Where appear MC flexion adhere  and mid 1/3 of volar graft - did one across at 90% with either side parallel 30% - for proximal adhesion at graft limiting wrist ROM  - pt ed on precautions and extra provided for wife that is RN to replace- but take off at night  Small pen size blister closing and healing great at end of proximal scar -  scab   CPM for wrist extention 200 sec Large screwdriver with coban around and hand wrap to it - able to do 120 sec 0 lbs - wrist extention - add 1 lbs for last 30 sec Large knob on BTE 2 lbs - 120 sec for RD  and UD   Wrist , elbow extention with shoulder at 90 flexion  - stabilzation on 1 kg ball 3 x 30 sec  Needed mod t/c to keep elbow  Extended  Pt to do at home 3 x 30 sec  PROM for thumb PA/RA including extention of IP  PROM of all digits - with focus on 5th - composite AROM and place and hold for thumb PA and RA  Pt to do at home 8 reps each  Partial range and place and hold with rubber band for PA and RA   BTE sup 120 sec at 2 lbs  Pronation 120 sec 2lbs  no Benik wrist splint and pain less than 1/10  First 10 reps end range PROM on both   Estim done - done on neuro rehab protocol for wrist extention -rolling over ball into extention with about 15.5 current  Place mid and proximal forearm - dorsally             OT Education - 10/22/16 0953    Education provided Yes   Education Details wrist , elbow extention on wall with ball -  with shoulder 90 degrees flexion , cont thumb PA and RA    Person(s) Educated Patient   Methods Explanation;Demonstration;Tactile cues   Comprehension Verbalized understanding;Returned demonstration          OT Short Term Goals - 09/27/16 1405      OT SHORT TERM GOAL #1   Title Pt to be ind in HEP to use splints correctly , increase ROM and sensatin in R hand and wrist    Baseline Tinel in palm and digits , splint wearing    Time 3   Period Weeks   Status On-going     OT SHORT TERM GOAL #2   Title Fabricate/ modify or change splints as needed to prevent contractures in R hand and wrist while nerve healing occur   Baseline Webspace getting tight , IP of thumb flexor contracture - cont to assess if need to change   Time 3   Period Weeks   Status On-going     OT SHORT TERM GOAL #3   Title R wrist AROM and PROM improve with 10-15 degrees to be able carry object on palm, wipe table and reach with wrist extention to grip    Baseline RD 0, Ext 2, UD 15 flexion 46, pronation 40, Sup 60 - progressing see flowsheet   Time 4   Period Weeks   Status On-going     OT SHORT TERM GOAL #4   Title Sensation improve in R hand to identify  ojbects in palm 50% of time    Baseline Tinel in diigts and palm    Time 4   Period Weeks   Status On-going           OT Long Term Goals - 09/27/16 1406      OT LONG TERM GOAL #1   Title Upgrade HEP to increase PIP and MC flexion as nerve is healing to grasp 6 inch object to 3 inch object   Baseline see flowsheet    Time 4   Period Weeks   Status On-going  OT LONG TERM GOAL #2   Title Wrist ROM improve and strength for pt to intiate using 1 lbs weight for wrist in all planes    Baseline 3/4 lbs for wrist extention , 2 lbs for RD , and sup - not pronation    Time 6   Period Weeks   Status On-going     OT LONG TERM GOAL #3   Title Assess shoulder and upgrade HEP to decrease pain and compensation    Baseline HEP provided - pt report  getting better    Time 4   Period Weeks   Status On-going               Plan - 10/22/16 0859    Clinical Impression Statement Pt show increase wrist extention this date , appear stronger in gripping tools on BTE - showed increase flexion at [facility] of R hand during flexion - and extention - scar still adhere at graft    Occupational performance deficits (Please refer to evaluation for details): ADL's;IADL's;Work;Play;Leisure   Rehab Potential Good   OT Frequency 2x / week   OT Duration 2 weeks   OT Treatment/Interventions Self-care/ADL training;Fluidtherapy;Splinting;Patient/family education;Therapeutic exercises;Ultrasound;Scar mobilization;Passive range of motion;Neuromuscular education;Electrical Stimulation;Manual Therapy   Plan assess scar - and upgrade as needed    Clinical Decision Making Multiple treatment options, significant modification of task necessary   OT Home Exercise Plan see pt instruction   Consulted and Agree with Plan of Care Patient      Patient will benefit from skilled therapeutic intervention in order to improve the following deficits and impairments:  Decreased range of motion, Impaired flexibility, Decreased coordination, Decreased safety awareness, Increased edema, Impaired sensation, Decreased skin integrity, Decreased knowledge of precautions, Decreased knowledge of use of DME, Decreased scar mobility, Impaired UE functional use, Pain, Decreased strength, Impaired perceived functional ability  Visit Diagnosis: Stiffness of right wrist, not elsewhere classified  Stiffness of right hand, not elsewhere classified  Pain in right arm  Other disturbances of skin sensation  Scar condition and fibrosis of skin  Muscle weakness (generalized)    Problem List There are no active problems to display for this patient.   Rosalyn Gess OTR/L,CLT 10/22/2016, 9:56 AM  [facility] [address], [place], [address] Phone: [phone]   Fax:  [phone]  Name: Francisco Johns MRN: [id number] Date of Birth: 1975-05-31

## 2016-10-22 NOTE — Patient Instructions (Addendum)
add Wrist , elbow extention with shoulder at 90 flexion  - stabilzation on 1 kg ball 3 x 30 sec  Needed mod t/c to keep elbow  Extended  Pt to do at home 3 x 30 sec

## 2016-10-25 ENCOUNTER — Ambulatory Visit: Payer: 59 | Admitting: Occupational Therapy

## 2016-10-25 DIAGNOSIS — M25631 Stiffness of right wrist, not elsewhere classified: Secondary | ICD-10-CM | POA: Diagnosis not present

## 2016-10-25 DIAGNOSIS — M6281 Muscle weakness (generalized): Secondary | ICD-10-CM

## 2016-10-25 DIAGNOSIS — L905 Scar conditions and fibrosis of skin: Secondary | ICD-10-CM | POA: Diagnosis not present

## 2016-10-25 DIAGNOSIS — M79601 Pain in right arm: Secondary | ICD-10-CM | POA: Diagnosis not present

## 2016-10-25 DIAGNOSIS — M25641 Stiffness of right hand, not elsewhere classified: Secondary | ICD-10-CM

## 2016-10-25 DIAGNOSIS — R208 Other disturbances of skin sensation: Secondary | ICD-10-CM | POA: Diagnosis not present

## 2016-10-25 NOTE — Therapy (Signed)
[facility] [address], [place], [address] Phone: [phone]   Fax:  [phone]  Occupational Therapy Treatment  Patient Details  Name: Francisco Johns MRN: [id number] Date of Birth: Aug 26, 1975 Referring Provider: Vernona Rieger  Encounter Date: 10/25/2016      OT End of Session - 10/25/16 0829    Visit Number 23   Number of Visits 24   Date for OT Re-Evaluation 11/21/16   OT Start Time 0811   OT Stop Time 0915   OT Time Calculation (min) 64 min   Activity Tolerance Patient tolerated treatment well   Behavior During Therapy [facility] for tasks assessed/performed      Past Medical History:  Diagnosis Date  . Metatarsal fracture    right big toe    Past Surgical History:  Procedure Laterality Date  . ankle surgery    . NO PAST SURGERIES    . ORIF TOE FRACTURE Right 12/09/2015   Procedure: OPEN REDUCTION INTERNAL FIXATION (ORIF) METATARSAL (TOE) FRACTURE, first metatarsal;  Surgeon: Renette Butters, MD;  Location: [place];  Service: Orthopedics;  Laterality: Right;    There were no vitals filed for this visit.      Subjective Assessment - 10/25/16 0823    Subjective  I kept the tape on last night - did okay - scab area is close - thought I felt fragment on top of wrist - but now its gone    Patient Stated Goals I want as much movement and use I can get for my R dominant hand- to work , play with my kids, fish , play golf ,   Currently in Pain? Yes   Pain Score 2    Pain Location Hand   Pain Orientation Right   Pain Descriptors / Indicators Tingling;Pins and needles                      OT Treatments/Exercises (OP) - 10/25/16 0001      Moist Heat Therapy   Number Minutes Moist Heat 10 Minutes   Moist Heat Location Hand  coban wrap to 5th digit in heatngpad    assess scar adhesions with wrist flexion , digits AROM with resistance - less adhesion with 2nd digit  flexion   Cont with cica scar pad at night time and during day on and off - larger one provided if not kinesiotape on  Assess scar and where to apply kinesiotape Kinesiotape this date applied on distal 1/3 part of graft  And middle where flexors of digits and wrist are adhere   did one across at 90% with either side parallel 30% - for distal and middle  adhesions at graft limiting wrist ROM - pt ed on precautions and extra provided for wife that is RN to replace- but take off at night  Small pen size blister proximal closed and healed   PROM for wrist extention over edge of table  Large screwdriver without flexion wrap 120 sec - 0 lbs   with coban around and hand wrap to it - able to do 120 sec 0 lbs - wrist extention  Large knob on BTE 2 lbs - 120 sec for RD   Wrist , elbow extention with shoulder at 90 flexion  - stabilzation on 1 kg ball 3 x 30 sec  Pt to do at home 3 x 30 sec  PROM for thumb PA/RA including extention of IP  PROM  of all digits - with focus on 5th - composite AROM and place and hold for thumb PA and RA  Pt to do at home 8 reps each  Partial range and place and hold with rubber band for PA and RA   BTE sup 120 sec at 2 lbs  Pronation 120 sec 2lbs  Pron/sup wheel 20 reps each way 2 lbs - pt to start neutral  Change for HEP Gripper able to do this date - 10 lbs 120 sec - place and hold 3 sec - placing MC in partial flexion   Estim done - done on neuro rehab protocol for wrist extention -rolling over ball into extention with about 15.5 current  Place mid and proximal forearm - dorsally               OT Education - 10/25/16 0828    Education provided Yes   Education Details HEP , taping and use of pron/sup wheel    Person(s) Educated Patient   Methods Explanation;Demonstration;Tactile cues;Verbal cues   Comprehension Verbal cues required;Returned demonstration;Verbalized understanding          OT Short Term Goals - 09/27/16 1405       OT SHORT TERM GOAL #1   Title Pt to be ind in HEP to use splints correctly , increase ROM and sensatin in R hand and wrist    Baseline Tinel in palm and digits , splint wearing    Time 3   Period Weeks   Status On-going     OT SHORT TERM GOAL #2   Title Fabricate/ modify or change splints as needed to prevent contractures in R hand and wrist while nerve healing occur   Baseline Webspace getting tight , IP of thumb flexor contracture - cont to assess if need to change   Time 3   Period Weeks   Status On-going     OT SHORT TERM GOAL #3   Title R wrist AROM and PROM improve with 10-15 degrees to be able carry object on palm, wipe table and reach with wrist extention to grip    Baseline RD 0, Ext 2, UD 15 flexion 46, pronation 40, Sup 60 - progressing see flowsheet   Time 4   Period Weeks   Status On-going     OT SHORT TERM GOAL #4   Title Sensation improve in R hand to identify  ojbects in palm 50% of time    Baseline Tinel in diigts and palm    Time 4   Period Weeks   Status On-going           OT Long Term Goals - 09/27/16 1406      OT LONG TERM GOAL #1   Title Upgrade HEP to increase PIP and MC flexion as nerve is healing to grasp 6 inch object to 3 inch object   Baseline see flowsheet    Time 4   Period Weeks   Status On-going     OT LONG TERM GOAL #2   Title Wrist ROM improve and strength for pt to intiate using 1 lbs weight for wrist in all planes    Baseline 3/4 lbs for wrist extention , 2 lbs for RD , and sup - not pronation    Time 6   Period Weeks   Status On-going     OT LONG TERM GOAL #3   Title Assess shoulder and upgrade HEP to decrease pain and compensation    Baseline HEP provided -  pt report getting better    Time 4   Period Weeks   Status On-going               Plan - 10/25/16 0830    Clinical Impression Statement Pt showing increase MC flexion during gripping - able to use gripper this date on BTE - but scar adhesion at graft - light  touch able to feel at tips of fingers except 4th and 5th at middle phalanges - 2 lbs at wrist - except for  wrist extention    Occupational performance deficits (Please refer to evaluation for details): ADL's;IADL's;Work;Play;Leisure   Rehab Potential Good   OT Frequency 2x / week   OT Duration 2 weeks   OT Treatment/Interventions Self-care/ADL training;Fluidtherapy;Splinting;Patient/family education;Therapeutic exercises;Ultrasound;Scar mobilization;Passive range of motion;Neuromuscular education;Electrical Stimulation;Manual Therapy   Plan assess wrist extention - how doing with 1 1/2 lbs    OT Home Exercise Plan see pt instruction   Consulted and Agree with Plan of Care Patient      Patient will benefit from skilled therapeutic intervention in order to improve the following deficits and impairments:  Decreased range of motion, Impaired flexibility, Decreased coordination, Decreased safety awareness, Increased edema, Impaired sensation, Decreased skin integrity, Decreased knowledge of precautions, Decreased knowledge of use of DME, Decreased scar mobility, Impaired UE functional use, Pain, Decreased strength, Impaired perceived functional ability  Visit Diagnosis: Stiffness of right wrist, not elsewhere classified  Stiffness of right hand, not elsewhere classified  Pain in right arm  Other disturbances of skin sensation  Scar condition and fibrosis of skin  Muscle weakness (generalized)    Problem List There are no active problems to display for this patient.   Rosalyn Gess OTR/L,CLT 10/25/2016, 9:54 AM  [facility] [address] [place], [address] Phone: [phone]   Fax:  [phone]  Name: Francisco Johns MRN: [id number] Date of Birth: 22-Jun-1975

## 2016-10-25 NOTE — Patient Instructions (Addendum)
  Kinesiotape this date applied on distal 1/3 part of graft  And middle where flexors of digits and wrist are adhere   did one across at 90% with either side parallel 30% - for distal and middle  adhesions at graft limiting wrist ROM - pt ed on precautions and extra provided for wife that is RN to replace- but take off at night    Pron/sup wheel 20 reps each way 2 lbs - pt to start neutral  Change for HEP   Estim done - done on neuro rehab protocol for wrist extention -rolling over ball into extention with about 15.5 current  Place mid and proximal forearm - dorsally

## 2016-10-29 ENCOUNTER — Ambulatory Visit: Payer: 59 | Admitting: Occupational Therapy

## 2016-10-29 DIAGNOSIS — M25641 Stiffness of right hand, not elsewhere classified: Secondary | ICD-10-CM | POA: Diagnosis not present

## 2016-10-29 DIAGNOSIS — R208 Other disturbances of skin sensation: Secondary | ICD-10-CM | POA: Diagnosis not present

## 2016-10-29 DIAGNOSIS — M6281 Muscle weakness (generalized): Secondary | ICD-10-CM | POA: Diagnosis not present

## 2016-10-29 DIAGNOSIS — L905 Scar conditions and fibrosis of skin: Secondary | ICD-10-CM | POA: Diagnosis not present

## 2016-10-29 DIAGNOSIS — M79601 Pain in right arm: Secondary | ICD-10-CM | POA: Diagnosis not present

## 2016-10-29 DIAGNOSIS — M25631 Stiffness of right wrist, not elsewhere classified: Secondary | ICD-10-CM

## 2016-10-29 NOTE — Patient Instructions (Addendum)
Wrist extention to be 1 or 1  1/2 lbs not 2 lbs

## 2016-10-29 NOTE — Therapy (Signed)
[facility] [address] [place], [address] Phone: [phone]   Fax:  [phone]  Occupational Therapy Treatment  Patient Details  Name: Francisco Johns MRN: [id number] Date of Birth: 13-Apr-1976 Referring Provider: Vernona Rieger  Encounter Date: 10/29/2016      OT End of Session - 10/29/16 1355    Visit Number 24   Number of Visits 3   Date for OT Re-Evaluation 11/21/16   OT Start Time 0801   OT Stop Time 0911   OT Time Calculation (min) 70 min   Activity Tolerance Patient tolerated treatment well   Behavior During Therapy [facility] for tasks assessed/performed      Past Medical History:  Diagnosis Date  . Metatarsal fracture    right big toe    Past Surgical History:  Procedure Laterality Date  . ankle surgery    . NO PAST SURGERIES    . ORIF TOE FRACTURE Right 12/09/2015   Procedure: OPEN REDUCTION INTERNAL FIXATION (ORIF) METATARSAL (TOE) FRACTURE, first metatarsal;  Surgeon: Renette Butters, MD;  Location: [place];  Service: Orthopedics;  Laterality: Right;    There were no vitals filed for this visit.      Subjective Assessment - 10/29/16 1349    Subjective  Doing okay - exercises doing same - most all wrist now 2 lbs - I have since yesterday this little pocket of swelling on side of scar on forearm - tender - like sore workout - but I don't know what I did    Patient Stated Goals I want as much movement and use I can get for my R dominant hand- to work , play with my kids, fish , play golf ,   Currently in Pain? No/denies                      OT Treatments/Exercises (OP) - 10/29/16 0001      Moist Heat Therapy   Number Minutes Moist Heat 10 Minutes   Moist Heat Location Hand  flexion coban wrap to 5th last 5 min       assess scar adhesions with wrist flexion  Pocket of swelling on radial side of proximal scar - tender like sore - maybe with RD of wrist  But  pt cannot remember what he did -started yesterday    Cont with cica scar pad at night time and during day on and off - larger one provided if not kinesiotape on  Assess scar and where to apply kinesiotape Kinesiotape this date applied on distal 1/3 part of graft  And middle where flexors of digits and wrist are adhere   did 3 across at 90% with either side parallel 30% - for distal and middle  adhesions at graft limiting wrist ROM - pt ed on precautions and extra provided for wife that is RN to replace- but take off at night    Wrist extention on CPM for 200 sec  1 lbs cuff weight  Then 1 1/2 lbs - cuff weight  Sliding without gravity with sock over weight - and on 2 inch file down hill - AROM and AAROM - pt to do at home - 1 1/2llbs but not 2 lbs  PROM end range    Wrist , elbow extention with shoulder at 90 flexion - stabilzation on 1 kg ball 3 x 30 sec  Pt to do at home 3 x 30 sec No pain  with above - just work out burn on dorsal forearm   PROM for thumb PA/RA including extention of IP  PROM of all digits - with focus on 5th - composite AROM and place and hold for thumb PA and RA  Pt to do at home 8 reps each  Partial range and place and hold with rubber band for PA and RA    Gripper able to do this date - 10 lbs 120 sec - place and hold 3 sec - placing MC in partial flexion  2 x done  Attempted lat grip on BTE - had some pain over pocket of swelling - with flexion of thumb  Held off   Estim done - done on neuro rehab protocol for wrist extention -rolling over ball into extention with about 16 current  Place dorsal hand  and proximal forearm -              OT Education - 10/29/16 1355    Education provided Yes   Education Details Wrist extention    Person(s) Educated Patient   Methods Explanation;Demonstration;Tactile cues;Verbal cues   Comprehension Returned demonstration;Verbalized understanding;Verbal cues required          OT Short Term  Goals - 09/27/16 1405      OT SHORT TERM GOAL #1   Title Pt to be ind in HEP to use splints correctly , increase ROM and sensatin in R hand and wrist    Baseline Tinel in palm and digits , splint wearing    Time 3   Period Weeks   Status On-going     OT SHORT TERM GOAL #2   Title Fabricate/ modify or change splints as needed to prevent contractures in R hand and wrist while nerve healing occur   Baseline Webspace getting tight , IP of thumb flexor contracture - cont to assess if need to change   Time 3   Period Weeks   Status On-going     OT SHORT TERM GOAL #3   Title R wrist AROM and PROM improve with 10-15 degrees to be able carry object on palm, wipe table and reach with wrist extention to grip    Baseline RD 0, Ext 2, UD 15 flexion 46, pronation 40, Sup 60 - progressing see flowsheet   Time 4   Period Weeks   Status On-going     OT SHORT TERM GOAL #4   Title Sensation improve in R hand to identify  ojbects in palm 50% of time    Baseline Tinel in diigts and palm    Time 4   Period Weeks   Status On-going           OT Long Term Goals - 09/27/16 1406      OT LONG TERM GOAL #1   Title Upgrade HEP to increase PIP and MC flexion as nerve is healing to grasp 6 inch object to 3 inch object   Baseline see flowsheet    Time 4   Period Weeks   Status On-going     OT LONG TERM GOAL #2   Title Wrist ROM improve and strength for pt to intiate using 1 lbs weight for wrist in all planes    Baseline 3/4 lbs for wrist extention , 2 lbs for RD , and sup - not pronation    Time 6   Period Weeks   Status On-going     OT LONG TERM GOAL #3   Title Assess shoulder and  upgrade HEP to decrease pain and compensation    Baseline HEP provided - pt report getting better    Time 4   Period Weeks   Status On-going               Plan - 10/29/16 1356    Clinical Impression Statement Pt's sensation improving - for ulnar N to middle phalanges - and med , Radial improving - pt  show increase strength - pt to check with surgeon appt if can increase weight - pt do have pocket of swelling since yesterday radial to proximal graft    Occupational performance deficits (Please refer to evaluation for details): ADL's;IADL's;Work;Play;Leisure   Rehab Potential Good   OT Frequency 2x / week   OT Duration 4 weeks   OT Treatment/Interventions Self-care/ADL training;Fluidtherapy;Splinting;Patient/family education;Therapeutic exercises;Ultrasound;Scar mobilization;Passive range of motion;Neuromuscular education;Electrical Stimulation;Manual Therapy   Plan how appt with surgeon went - if weight can be increase    Clinical Decision Making Multiple treatment options, significant modification of task necessary   OT Home Exercise Plan see pt instruction   Consulted and Agree with Plan of Care Patient      Patient will benefit from skilled therapeutic intervention in order to improve the following deficits and impairments:     Visit Diagnosis: Stiffness of right wrist, not elsewhere classified  Stiffness of right hand, not elsewhere classified  Pain in right arm  Other disturbances of skin sensation  Scar condition and fibrosis of skin  Muscle weakness (generalized)    Problem List There are no active problems to display for this patient.   Rosalyn Gess OTR/L,CLT  10/29/2016, 1:59 PM  [facility] [address] [place], [address] Phone: [phone]   Fax:  [phone]  Name: BRENNYN HAISLEY MRN: [id number] Date of Birth: [birth year]

## 2016-10-31 DIAGNOSIS — S58111A Complete traumatic amputation at level between elbow and wrist, right arm, initial encounter: Secondary | ICD-10-CM | POA: Diagnosis not present

## 2016-10-31 DIAGNOSIS — S68411D Complete traumatic amputation of right hand at wrist level, subsequent encounter: Secondary | ICD-10-CM | POA: Diagnosis not present

## 2016-11-01 DIAGNOSIS — Z1283 Encounter for screening for malignant neoplasm of skin: Secondary | ICD-10-CM | POA: Diagnosis not present

## 2016-11-01 DIAGNOSIS — B078 Other viral warts: Secondary | ICD-10-CM | POA: Diagnosis not present

## 2016-11-01 DIAGNOSIS — Z85820 Personal history of malignant melanoma of skin: Secondary | ICD-10-CM | POA: Diagnosis not present

## 2016-11-01 DIAGNOSIS — Z08 Encounter for follow-up examination after completed treatment for malignant neoplasm: Secondary | ICD-10-CM | POA: Diagnosis not present

## 2016-11-05 ENCOUNTER — Ambulatory Visit: Payer: 59 | Admitting: Occupational Therapy

## 2016-11-05 DIAGNOSIS — M25631 Stiffness of right wrist, not elsewhere classified: Secondary | ICD-10-CM | POA: Diagnosis not present

## 2016-11-05 DIAGNOSIS — M79601 Pain in right arm: Secondary | ICD-10-CM | POA: Diagnosis not present

## 2016-11-05 DIAGNOSIS — L905 Scar conditions and fibrosis of skin: Secondary | ICD-10-CM | POA: Diagnosis not present

## 2016-11-05 DIAGNOSIS — M6281 Muscle weakness (generalized): Secondary | ICD-10-CM

## 2016-11-05 DIAGNOSIS — R208 Other disturbances of skin sensation: Secondary | ICD-10-CM

## 2016-11-05 DIAGNOSIS — M25641 Stiffness of right hand, not elsewhere classified: Secondary | ICD-10-CM

## 2016-11-05 NOTE — Therapy (Signed)
[facility] [address], [place], [address] Phone: [phone]   Fax:  [phone]  Occupational Therapy Treatment  Patient Details  Name: Francisco Johns MRN: [id number] Date of Birth: 05-29-1975 Referring Provider: Vernona Rieger  Encounter Date: 11/05/2016      OT End of Session - 11/05/16 0846    Visit Number 25   Number of Visits 32   Date for OT Re-Evaluation 11/21/16   OT Start Time 0810   OT Stop Time 0922   OT Time Calculation (min) 72 min   Activity Tolerance Patient tolerated treatment well   Behavior During Therapy [facility] for tasks assessed/performed      Past Medical History:  Diagnosis Date  . Metatarsal fracture    right big toe    Past Surgical History:  Procedure Laterality Date  . ankle surgery    . NO PAST SURGERIES    . ORIF TOE FRACTURE Right 12/09/2015   Procedure: OPEN REDUCTION INTERNAL FIXATION (ORIF) METATARSAL (TOE) FRACTURE, first metatarsal;  Surgeon: Renette Butters, MD;  Location: [place];  Service: Orthopedics;  Laterality: Right;    There were no vitals filed for this visit.      Subjective Assessment - 11/05/16 0840    Subjective  Seen Dr Serafina Royals - happy with progress and go back in 4 wks - up my weight to 5 lbs and no limitations- I can pinch with my thumb now , bend my knuckles and can feel hot and cold better    Patient Stated Goals I want as much movement and use I can get for my R dominant hand- to work , play with my kids, fish , play golf ,   Currently in Pain? No/denies            [facility] OT Assessment - 11/05/16 0001      Right Hand AROM   R Index  MCP 0-90 70 Degrees   R Long  MCP 0-90 70 Degrees   R Ring  MCP 0-90 70 Degrees                  OT Treatments/Exercises (OP) - 11/05/16 0001      Moist Heat Therapy   Number Minutes Moist Heat 10 Minutes   Moist Heat Location Hand;Wrist  coban flexion wrap to 5th and 4th        assess scar adhesions with wrist flexion , wrist extention , composite flexion of digits and wrist  Digits flexion - MC of 2nd thr 4th now 70 degrees  Able to do lat grip - but still no thumb IP extention   Cont with cica scar pad at night time and during day on and off - larger one provided if not kinesiotape on  Assess scar and where to apply kinesiotape Kinesiotape this date applied on distal 1/3 part of graft And middle where flexors of digits and wrist are adhere  did 3 across at 90% with either side parallel 30% - for distal and middle adhesions at graft limiting wrist ROM - pt ed on precautions and extra provided for wife that is RN to replace- but take off at night  Redone at end of session tape to do x across distal adhesion   BTE Wrist sup 3 lbs 120 sec   BTE wrist pronation 2 lbs 120 sec   RD and UD - 2 lbs 120 sec each   Wrist extention isometric strengthening  10 x  Add to HEP  Wrist extention rolling 2 kg ball - push with wrist extention - stabilize forearm    Wrist , elbow extention with shoulder at 90 flexion - stabilzation on 1 kg ball 3 x 30 sec  Pt to do at home 3 x 45 sec No pain with above - just work out burn on dorsal forearm   PROM for thumb PA/RA including extention of IP  PROM of all digits - with focus on 5th - composite AROM and place and hold for thumb PA and RA  Pt to do at home 8 reps each  Partial range and place and hold with rubber band for PA and RA  Lat grip on BTE for 120 sec at 8 lbs - coban on piece to decrease sliding off of thumb   Gripper able to do this date - 10 lbs 120 sec - place and hold 3 sec - placing MC in partial flexion  2nd time increase to 15 lbs - 120 sec   Estim done - done on neuro rehab protocol for wrist extention -rolling over ball into extention with about 15.5 current  Place mid and proximal forearm - dorsally            OT Education - 11/05/16 0846    Education provided Yes   Education  Details Increase lat pinch and MC flexion - to use at home   Person(s) Educated Patient   Methods Explanation;Demonstration;Tactile cues;Verbal cues   Comprehension Returned demonstration;Verbalized understanding          OT Short Term Goals - 09/27/16 1405      OT SHORT TERM GOAL #1   Title Pt to be ind in HEP to use splints correctly , increase ROM and sensatin in R hand and wrist    Baseline Tinel in palm and digits , splint wearing    Time 3   Period Weeks   Status On-going     OT SHORT TERM GOAL #2   Title Fabricate/ modify or change splints as needed to prevent contractures in R hand and wrist while nerve healing occur   Baseline Webspace getting tight , IP of thumb flexor contracture - cont to assess if need to change   Time 3   Period Weeks   Status On-going     OT SHORT TERM GOAL #3   Title R wrist AROM and PROM improve with 10-15 degrees to be able carry object on palm, wipe table and reach with wrist extention to grip    Baseline RD 0, Ext 2, UD 15 flexion 46, pronation 40, Sup 60 - progressing see flowsheet   Time 4   Period Weeks   Status On-going     OT SHORT TERM GOAL #4   Title Sensation improve in R hand to identify  ojbects in palm 50% of time    Baseline Tinel in diigts and palm    Time 4   Period Weeks   Status On-going           OT Long Term Goals - 09/27/16 1406      OT LONG TERM GOAL #1   Title Upgrade HEP to increase PIP and MC flexion as nerve is healing to grasp 6 inch object to 3 inch object   Baseline see flowsheet    Time 4   Period Weeks   Status On-going     OT LONG TERM GOAL #2   Title Wrist ROM improve and  strength for pt to intiate using 1 lbs weight for wrist in all planes    Baseline 3/4 lbs for wrist extention , 2 lbs for RD , and sup - not pronation    Time 6   Period Weeks   Status On-going     OT LONG TERM GOAL #3   Title Assess shoulder and upgrade HEP to decrease pain and compensation    Baseline HEP provided - pt  report getting better    Time 4   Period Weeks   Status On-going               Plan - 11/05/16 5188    Clinical Impression Statement Pt show improvement in lat pinch , still no IP extention and increase MC flexion of 2nd thru 4th -and 5th not as tight this date - still had to do PROM flexion - report increase sensation top of hand - for cold and hot - cont to strengthinhg - new order from surgoen - can increase to 5 lbs and  no limitations    Occupational performance deficits (Please refer to evaluation for details): ADL's;IADL's;Work;Play;Leisure   Rehab Potential Good   OT Frequency 2x / week   OT Duration 4 weeks   OT Treatment/Interventions Self-care/ADL training;Fluidtherapy;Splinting;Patient/family education;Therapeutic exercises;Ultrasound;Scar mobilization;Passive range of motion;Neuromuscular education;Electrical Stimulation;Manual Therapy   Plan upgrade HEP and increase weight as needed    Clinical Decision Making Multiple treatment options, significant modification of task necessary   OT Home Exercise Plan see pt instruction   Consulted and Agree with Plan of Care Patient      Patient will benefit from skilled therapeutic intervention in order to improve the following deficits and impairments:  Decreased range of motion, Impaired flexibility, Decreased coordination, Decreased safety awareness, Increased edema, Impaired sensation, Decreased skin integrity, Decreased knowledge of precautions, Decreased knowledge of use of DME, Decreased scar mobility, Impaired UE functional use, Pain, Decreased strength, Impaired perceived functional ability  Visit Diagnosis: Stiffness of right wrist, not elsewhere classified  Stiffness of right hand, not elsewhere classified  Pain in right arm  Other disturbances of skin sensation  Scar condition and fibrosis of skin  Muscle weakness (generalized)    Problem List There are no active problems to display for this  patient.   Rosalyn Gess OTR/L,CLT 11/05/2016, 10:22 AM  [facility] [address], [place], [address] Phone: [phone]   Fax:  [phone]  Name: Francisco Johns MRN: [id number] Date of Birth: [birth year]

## 2016-11-05 NOTE — Patient Instructions (Addendum)
Tape x over adhesion at scar  wrist extention isometric  And wrist extention pushing with extention 2 lbs object that can roll or slide

## 2016-11-07 ENCOUNTER — Ambulatory Visit: Payer: 59 | Admitting: Occupational Therapy

## 2016-11-07 DIAGNOSIS — M25641 Stiffness of right hand, not elsewhere classified: Secondary | ICD-10-CM

## 2016-11-07 DIAGNOSIS — L905 Scar conditions and fibrosis of skin: Secondary | ICD-10-CM | POA: Diagnosis not present

## 2016-11-07 DIAGNOSIS — M25631 Stiffness of right wrist, not elsewhere classified: Secondary | ICD-10-CM

## 2016-11-07 DIAGNOSIS — M6281 Muscle weakness (generalized): Secondary | ICD-10-CM | POA: Diagnosis not present

## 2016-11-07 DIAGNOSIS — R208 Other disturbances of skin sensation: Secondary | ICD-10-CM | POA: Diagnosis not present

## 2016-11-07 DIAGNOSIS — M79601 Pain in right arm: Secondary | ICD-10-CM | POA: Diagnosis not present

## 2016-11-07 NOTE — Therapy (Signed)
[facility] [address], [place], [address] Phone: [phone]   Fax:  [phone]  Occupational Therapy Treatment  Patient Details  Name: Francisco Johns MRN: [id number] Date of Birth: 1975/12/14 Referring Provider: Vernona Rieger  Encounter Date: 11/07/2016      OT End of Session - 11/07/16 0814    Visit Number 26   Number of Visits 32   Date for OT Re-Evaluation 11/21/16   OT Start Time 0815   OT Stop Time 0930   OT Time Calculation (min) 75 min   Activity Tolerance Patient tolerated treatment well   Behavior During Therapy [facility] for tasks assessed/performed      Past Medical History:  Diagnosis Date  . Metatarsal fracture    right big toe    Past Surgical History:  Procedure Laterality Date  . ankle surgery    . NO PAST SURGERIES    . ORIF TOE FRACTURE Right 12/09/2015   Procedure: OPEN REDUCTION INTERNAL FIXATION (ORIF) METATARSAL (TOE) FRACTURE, first metatarsal;  Surgeon: Renette Butters, MD;  Location: [place];  Service: Orthopedics;  Laterality: Right;    There were no vitals filed for this visit.      Subjective Assessment - 11/07/16 0815    Subjective  Shoulder still doing okay - shoulder sometimes harder and cannot reach to lower back - some pain - see ortho doc Friday    Patient Stated Goals I want as much movement and use I can get for my R dominant hand- to work , play with my kids, fish , play golf ,   Currently in Pain? Yes   Pain Score 1    Pain Location Hand   Pain Orientation Right   Pain Descriptors / Indicators Tingling;Pins and needles   Pain Type Surgical pain                      OT Treatments/Exercises (OP) - 11/07/16 0001      Moist Heat Therapy   Number Minutes Moist Heat 10 Minutes   Moist Heat Location Hand;Wrist  Coban flexion wrap to 5th digit      Semmes weinstein tested  4.31 thumb to IP , 2nd and 3rd was do middle  phalanges 4th to middle phalanges  5th proximal phalanges  Not able to do 3.61 consistence    Cont with cica scar pad at night time and during day on and off - larger one provided if not kinesiotape on  Assess scar and where to apply kinesiotape Kinesiotape this date applied on distal 1/3 part of graft And middle where flexors of digits and wrist are adhere  did 3 across at 90% with either side parallel 30% - but did one more wider cut - for distal and middle adhesions at graft limiting wrist ROM - pt ed on precautions and extra provided for wife that is RN to replace- but take off at night  Redone at end of session tape to do x across distal adhesion   BTE Wrist sup 3 lbs 120 sec   BTE wrist pronation 2 lbs 120 sec   RD 2 lbs  and UD 3 lbs  -  120 sec each      Wrist , elbow extention with shoulder at 90 flexion - stabilzation on 1 kg ball 3 x 30 sec  Pt to do at home 3 x 45 sec No pain with above - just work  out burn on dorsal forearm   PROM for thumb PA/RA including extention of IP  PROM of all digits - with focus on 5th - composite AROM and place and hold for thumb PA and RA  Pt to do at home 8 reps each  Partial range and place and hold with rubber band for PA and RA  Lat grip on BTE for 120 sec at10 lbs - coban on piece to decrease sliding off of thumb   Gripper able to do this date - 10 lbs 120 sec - place and hold 3 sec - placing MC in partial flexion  2nd time increase to 15 lbs - 120 sec Shoulder 3 lbs cuff weight for pronation , ext rotation , tricep - add to HEP and need to keep wrist neutral  UBE 5 min - gripping on - and change directions   Estim done - done on neuro rehab protocol for wrist extention -rolling over ball into extention with about 15.5 current  Place mid and proximal forearm - dorsally                  OT Short Term Goals - 09/27/16 1405      OT SHORT TERM GOAL #1   Title Pt to be ind in HEP to use splints correctly ,  increase ROM and sensatin in R hand and wrist    Baseline Tinel in palm and digits , splint wearing    Time 3   Period Weeks   Status On-going     OT SHORT TERM GOAL #2   Title Fabricate/ modify or change splints as needed to prevent contractures in R hand and wrist while nerve healing occur   Baseline Webspace getting tight , IP of thumb flexor contracture - cont to assess if need to change   Time 3   Period Weeks   Status On-going     OT SHORT TERM GOAL #3   Title R wrist AROM and PROM improve with 10-15 degrees to be able carry object on palm, wipe table and reach with wrist extention to grip    Baseline RD 0, Ext 2, UD 15 flexion 46, pronation 40, Sup 60 - progressing see flowsheet   Time 4   Period Weeks   Status On-going     OT SHORT TERM GOAL #4   Title Sensation improve in R hand to identify  ojbects in palm 50% of time    Baseline Tinel in diigts and palm    Time 4   Period Weeks   Status On-going           OT Long Term Goals - 09/27/16 1406      OT LONG TERM GOAL #1   Title Upgrade HEP to increase PIP and MC flexion as nerve is healing to grasp 6 inch object to 3 inch object   Baseline see flowsheet    Time 4   Period Weeks   Status On-going     OT LONG TERM GOAL #2   Title Wrist ROM improve and strength for pt to intiate using 1 lbs weight for wrist in all planes    Baseline 3/4 lbs for wrist extention , 2 lbs for RD , and sup - not pronation    Time 6   Period Weeks   Status On-going     OT LONG TERM GOAL #3   Title Assess shoulder and upgrade HEP to decrease pain and compensation    Baseline HEP  provided - pt report getting better    Time 4   Period Weeks   Status On-going               Plan - 11/07/16 1532    Clinical Impression Statement Pt making progress - with Thornell Mule progress greatly and consistance with sensation - increase strength - able to increase some to 3 lbs -    Occupational performance deficits (Please refer to  evaluation for details): ADL's;IADL's;Work;Play;Leisure   Rehab Potential Good   OT Frequency 2x / week   OT Duration 4 weeks   OT Treatment/Interventions Self-care/ADL training;Fluidtherapy;Splinting;Patient/family education;Therapeutic exercises;Ultrasound;Scar mobilization;Passive range of motion;Neuromuscular education;Electrical Stimulation;Manual Therapy   Plan upgrade HEP - how 3 lbs doing    Clinical Decision Making Multiple treatment options, significant modification of task necessary   OT Home Exercise Plan see pt instruction   Consulted and Agree with Plan of Care Patient      Patient will benefit from skilled therapeutic intervention in order to improve the following deficits and impairments:  Decreased range of motion, Impaired flexibility, Decreased coordination, Decreased safety awareness, Increased edema, Impaired sensation, Decreased skin integrity, Decreased knowledge of precautions, Decreased knowledge of use of DME, Decreased scar mobility, Impaired UE functional use, Pain, Decreased strength, Impaired perceived functional ability  Visit Diagnosis: Stiffness of right wrist, not elsewhere classified  Stiffness of right hand, not elsewhere classified  Pain in right arm  Other disturbances of skin sensation  Scar condition and fibrosis of skin  Muscle weakness (generalized)    Problem List There are no active problems to display for this patient.   Rosalyn Gess OTR/L,CLT 11/07/2016, 3:35 PM  [facility] [address], [place], [address] Phone: [phone]   Fax:  [phone]  Name: Francisco Johns MRN: [id number] Date of Birth: Mar 01, 1976

## 2016-11-07 NOTE — Patient Instructions (Addendum)
Same HEP  3 lbs for shoulder

## 2016-11-09 DIAGNOSIS — Z9489 Other transplanted organ and tissue status: Secondary | ICD-10-CM | POA: Diagnosis not present

## 2016-11-09 DIAGNOSIS — S58111D Complete traumatic amputation at level between elbow and wrist, right arm, subsequent encounter: Secondary | ICD-10-CM | POA: Diagnosis not present

## 2016-11-09 DIAGNOSIS — Z967 Presence of other bone and tendon implants: Secondary | ICD-10-CM | POA: Diagnosis not present

## 2016-11-09 DIAGNOSIS — Z8781 Personal history of (healed) traumatic fracture: Secondary | ICD-10-CM | POA: Diagnosis not present

## 2016-11-09 DIAGNOSIS — Z9889 Other specified postprocedural states: Secondary | ICD-10-CM | POA: Diagnosis not present

## 2016-11-09 DIAGNOSIS — Z4789 Encounter for other orthopedic aftercare: Secondary | ICD-10-CM | POA: Diagnosis not present

## 2016-11-14 ENCOUNTER — Ambulatory Visit: Payer: 59 | Admitting: Occupational Therapy

## 2016-11-14 DIAGNOSIS — R208 Other disturbances of skin sensation: Secondary | ICD-10-CM | POA: Diagnosis not present

## 2016-11-14 DIAGNOSIS — M25631 Stiffness of right wrist, not elsewhere classified: Secondary | ICD-10-CM | POA: Diagnosis not present

## 2016-11-14 DIAGNOSIS — M25641 Stiffness of right hand, not elsewhere classified: Secondary | ICD-10-CM

## 2016-11-14 DIAGNOSIS — M79601 Pain in right arm: Secondary | ICD-10-CM | POA: Diagnosis not present

## 2016-11-14 DIAGNOSIS — L905 Scar conditions and fibrosis of skin: Secondary | ICD-10-CM

## 2016-11-14 DIAGNOSIS — M6281 Muscle weakness (generalized): Secondary | ICD-10-CM

## 2016-11-14 NOTE — Therapy (Signed)
[facility] [address] [place], [address] Phone: [phone]   Fax:  [phone]  Occupational Therapy Treatment  Patient Details  Name: Francisco Johns MRN: [id number] Date of Birth: 08/05/1975 Referring Provider: Vernona Rieger  Encounter Date: 11/14/2016      OT End of Session - 11/14/16 1526    Visit Number 27   Number of Visits 32   Date for OT Re-Evaluation 11/21/16   OT Start Time 0906   OT Stop Time 1019   OT Time Calculation (min) 73 min   Activity Tolerance Patient tolerated treatment well   Behavior During Therapy [facility] for tasks assessed/performed      Past Medical History:  Diagnosis Date  . Metatarsal fracture    right big toe    Past Surgical History:  Procedure Laterality Date  . ankle surgery    . NO PAST SURGERIES    . ORIF TOE FRACTURE Right 12/09/2015   Procedure: OPEN REDUCTION INTERNAL FIXATION (ORIF) METATARSAL (TOE) FRACTURE, first metatarsal;  Surgeon: Renette Butters, MD;  Location: [place];  Service: Orthopedics;  Laterality: Right;    There were no vitals filed for this visit.      Subjective Assessment - 11/14/16 1522    Subjective  Seen ortho doc - no limitations - to follow up in 6 months again - decrease nerve pain - not as shooting anymore    Patient Stated Goals I want as much movement and use I can get for my R dominant hand- to work , play with my kids, fish , play golf ,   Currently in Pain? No/denies                      OT Treatments/Exercises (OP) - 11/14/16 0001      Moist Heat Therapy   Number Minutes Moist Heat 10 Minutes   Moist Heat Location Hand;Wrist  coban wrap 4thand 5th digit into flexion        Cont with cica scar pad at night time and during day on and off - larger one provided if not kinesiotape on  Assess scar and where to apply kinesiotape Kinesiotape this date applied on distal 1/3 part of graft And  middle where flexors of digits and wrist are adhere  did 4 across at 90% with either side parallel 30% - cut longer  - for distal and middle adhesions at graft limiting wrist ROM - pt ed on precautions and extra provided for wife that is RN to replace- but take off at night  Redone at end of session tape to do x across distal adhesion  [facility]One tape at [facility] prior to BTE and redo afterwards - come loose   BTE Wrist sup 3 lbs 120 sec  BTE wrist pronation 2 lbs 120 sec   RD 2 lbs  and UD 3 lbs  -  120 sec each      Wrist , elbow extention with shoulder at 90 flexion - stabilzation on 2 kg ball 2 x 30 sec  Pt to do at home 3 x 45 sec No pain with above - just work out burn on dorsal forearm   PROM for thumb PA/RA including extention of IP  PROM of all digits - with focus on 5th - composite AROM and place and hold for thumb PA and RA And review again rubber band with both - place and hold   Pt  to do at home 8 reps each   Lat grip on BTE for 120 sec at12 lbs - coban on piece to decrease sliding off of thumb   Gripper able to do this date - 14 lbs 120 sec - place and hold 3 sec - placing MC in partial flexion - if more weight - pt pull instead of squeeze 2nd time same Shoulder 2 1/2  lbs cuff weight for pronation , ext rotation , tricep - add to HEP and need to keep wrist neutral  pect stretch and massage to be done   retraction of scapula Avoid doing task across body but out to side    Estim done - done on neuro rehab protocol for wrist extention -rolling over ball into extention with about 15.5 current  Place mid and proximal forearm - dorsally               OT Education - 11/14/16 1525    Education provided Yes   Education Details thumb PA and RA - and new Exercises  for shoulder   Person(s) Educated Patient   Methods Explanation;Demonstration;Tactile cues;Verbal cues   Comprehension Verbal cues required;Returned demonstration;Verbalized understanding           OT Short Term Goals - 09/27/16 1405      OT SHORT TERM GOAL #1   Title Pt to be ind in HEP to use splints correctly , increase ROM and sensatin in R hand and wrist    Baseline Tinel in palm and digits , splint wearing    Time 3   Period Weeks   Status On-going     OT SHORT TERM GOAL #2   Title Fabricate/ modify or change splints as needed to prevent contractures in R hand and wrist while nerve healing occur   Baseline Webspace getting tight , IP of thumb flexor contracture - cont to assess if need to change   Time 3   Period Weeks   Status On-going     OT SHORT TERM GOAL #3   Title R wrist AROM and PROM improve with 10-15 degrees to be able carry object on palm, wipe table and reach with wrist extention to grip    Baseline RD 0, Ext 2, UD 15 flexion 46, pronation 40, Sup 60 - progressing see flowsheet   Time 4   Period Weeks   Status On-going     OT SHORT TERM GOAL #4   Title Sensation improve in R hand to identify  ojbects in palm 50% of time    Baseline Tinel in diigts and palm    Time 4   Period Weeks   Status On-going           OT Long Term Goals - 09/27/16 1406      OT LONG TERM GOAL #1   Title Upgrade HEP to increase PIP and MC flexion as nerve is healing to grasp 6 inch object to 3 inch object   Baseline see flowsheet    Time 4   Period Weeks   Status On-going     OT LONG TERM GOAL #2   Title Wrist ROM improve and strength for pt to intiate using 1 lbs weight for wrist in all planes    Baseline 3/4 lbs for wrist extention , 2 lbs for RD , and sup - not pronation    Time 6   Period Weeks   Status On-going     OT LONG TERM GOAL #3  Title Assess shoulder and upgrade HEP to decrease pain and compensation    Baseline HEP provided - pt report getting better    Time 4   Period Weeks   Status On-going               Plan - 11/14/16 1526    Clinical Impression Statement Pt cont to make slow but steady progress in nerve healling, scar tissue , ROM  and strength - pt do better with cuff weight 2 1/2 lbs then to increaes from 2 to 3 ls - but some on BTE is 3 lbs    Occupational performance deficits (Please refer to evaluation for details): IADL's;ADL's;Work;Play;Leisure   Rehab Potential Good   OT Frequency 2x / week   OT Duration 2 weeks   OT Treatment/Interventions Self-care/ADL training;Fluidtherapy;Splinting;Patient/family education;Therapeutic exercises;Ultrasound;Scar mobilization;Passive range of motion;Neuromuscular education;Electrical Stimulation;Manual Therapy   Plan decrease to 2 1/2 lbs for wrist and shoulder    Clinical Decision Making Multiple treatment options, significant modification of task necessary   OT Home Exercise Plan see pt instruction   Consulted and Agree with Plan of Care Patient      Patient will benefit from skilled therapeutic intervention in order to improve the following deficits and impairments:  Decreased range of motion, Impaired flexibility, Decreased coordination, Decreased safety awareness, Increased edema, Impaired sensation, Decreased skin integrity, Decreased knowledge of precautions, Decreased knowledge of use of DME, Decreased scar mobility, Impaired UE functional use, Pain, Decreased strength, Impaired perceived functional ability  Visit Diagnosis: Stiffness of right wrist, not elsewhere classified  Stiffness of right hand, not elsewhere classified  Pain in right arm  Other disturbances of skin sensation  Scar condition and fibrosis of skin  Muscle weakness (generalized)    Problem List There are no active problems to display for this patient.   Rosalyn Gess OTR/L,CLT  11/14/2016, 3:29 PM  [facility] [address] [place], [address] Phone: [phone]   Fax:  [phone]  Name: Francisco Johns MRN: [id number] Date of Birth: 1975-11-07

## 2016-11-14 NOTE — Patient Instructions (Signed)
2 1/2 lbs provided for shoulder and wrist to use at home   cuff weigth   work on Gaffer and massage  Scapula retraction  And work with arm out to side not across body   Same HEP for wrist and hand   rubber band for both PA and RA  Place and hold

## 2016-11-15 ENCOUNTER — Ambulatory Visit: Payer: 59 | Admitting: Occupational Therapy

## 2016-11-15 DIAGNOSIS — M79601 Pain in right arm: Secondary | ICD-10-CM

## 2016-11-15 DIAGNOSIS — M25631 Stiffness of right wrist, not elsewhere classified: Secondary | ICD-10-CM

## 2016-11-15 DIAGNOSIS — M25641 Stiffness of right hand, not elsewhere classified: Secondary | ICD-10-CM

## 2016-11-15 DIAGNOSIS — R208 Other disturbances of skin sensation: Secondary | ICD-10-CM

## 2016-11-15 DIAGNOSIS — L905 Scar conditions and fibrosis of skin: Secondary | ICD-10-CM | POA: Diagnosis not present

## 2016-11-15 DIAGNOSIS — M6281 Muscle weakness (generalized): Secondary | ICD-10-CM

## 2016-11-15 NOTE — Patient Instructions (Addendum)
Same HEP  But wean into Benik neoprene more from 2 hrs to 4 hrs  And decrease hard wrist splint from every 2 hrs to 2 hrs in middle of day  And webspacer during day and some at night to increase thumb ABD

## 2016-11-15 NOTE — Therapy (Signed)
[name] [address], [place], [address] Phone: [phone]   Fax:  [phone]  Occupational Therapy Treatment  Patient Details  Name: Francisco Johns MRN: [id number] Date of Birth: 08-Jan-1976 Referring Provider: Vernona Rieger  Encounter Date: 11/15/2016      OT End of Session - 11/15/16 1449    Visit Number 28   Number of Visits 32   Date for OT Re-Evaluation 11/21/16   OT Start Time 1345   OT Stop Time 1450   OT Time Calculation (min) 65 min   Activity Tolerance Patient tolerated treatment well   Behavior During Therapy [name] for tasks assessed/performed      Past Medical History:  Diagnosis Date  . Metatarsal fracture    right big toe    Past Surgical History:  Procedure Laterality Date  . ankle surgery    . NO PAST SURGERIES    . ORIF TOE FRACTURE Right 12/09/2015   Procedure: OPEN REDUCTION INTERNAL FIXATION (ORIF) METATARSAL (TOE) FRACTURE, first metatarsal;  Surgeon: Renette Butters, MD;  Location: [facility];  Service: Orthopedics;  Laterality: Right;    There were no vitals filed for this visit.      Subjective Assessment - 11/15/16 1443    Subjective  I am little sore from yesterday - think the thumb exercises and knob on machine - I brought my splints in for you to look at    Patient Stated Goals I want as much movement and use I can get for my R dominant hand- to work , play with my kids, fish , play golf ,   Currently in Pain? No/denies            [facility] OT Assessment - 11/15/16 0001      AROM   Right Forearm Pronation 60 Degrees   Right Forearm Supination 85 Degrees   Right Wrist Extension 34 Degrees   Left Wrist Radial Deviation 25 Degrees   Left Wrist Ulnar Deviation 10 Degrees    assess wrist splint and webspacer - cleaned , adjusted padding at IP of thumb  And replace straps on all -review with pt wearing of splints - to wean gradually into benik blue  more an less in hard wrist splint  Webspacer as needed    Cont with cica scar pad at night time and during day on and off - larger one provided if not kinesiotape on  Assess scar and where to apply kinesiotape Kinesiotape this date applied on 2 areas on graft -that are adhere - with resistance to 3 thru 5th digits and wrist flexion - did across 2 long at 100 % at each and 30%  Parallel to graft - pt ed on precautions and extra provided for wife that is RN to replace- but take off at night  [facility]One tape at [facility] prior to BTE and redo afterwards - come loose   BTE 701 at 0lbs - place and hold for wrist extention - 100 sec   Wrist , elbow extention with shoulder at 90 flexion - stabilzation on 2 kg ball 3 x 30 sec  Pt to do at home 3 x 45 sec No pain with above - just work out burn on dorsal forearm   PROM for thumb PA/RA including extention of IP  PROM of all digits - with focus on 5th - composite AROM and place and hold for thumb PA and RA   Gripper able to  do this date - 14 lbs 120 sec - place and hold 3 sec - placing MC in partial flexion - if more weight - pt pull instead of squeeze Large screwdriver for wrist extention 2 lbs 120 sec   Cont at home : Shoulder 2 1/2  lbs cuff weight for pronation , ext rotation , tricep - add to HEP and need to keep wrist neutral  pect stretch and massage to be done   retraction of scapula Avoid doing task across body but out to side               OT Treatments/Exercises (OP) - 11/15/16 0001      Moist Heat Therapy   Number Minutes Moist Heat 10 Minutes   Moist Heat Location Hand;Wrist  Coban flexion wrap for 5th and 4th      Electrical Stimulation   Electrical Stimulation Location Wrist extensors   Electrical Stimulation Action digits and wrist extention    Electrical Stimulation Parameters protocol for muscle reed - 18.5 intensity - 8 min    Electrical Stimulation Goals Strength                OT Education -  11/15/16 1446    Education provided Yes   Education Details splint wearing , and HEP    Person(s) Educated Patient   Methods Explanation;Demonstration;Tactile cues;Verbal cues   Comprehension Verbalized understanding          OT Short Term Goals - 09/27/16 1405      OT SHORT TERM GOAL #1   Title Pt to be ind in HEP to use splints correctly , increase ROM and sensatin in R hand and wrist    Baseline Tinel in palm and digits , splint wearing    Time 3   Period Weeks   Status On-going     OT SHORT TERM GOAL #2   Title Fabricate/ modify or change splints as needed to prevent contractures in R hand and wrist while nerve healing occur   Baseline Webspace getting tight , IP of thumb flexor contracture - cont to assess if need to change   Time 3   Period Weeks   Status On-going     OT SHORT TERM GOAL #3   Title R wrist AROM and PROM improve with 10-15 degrees to be able carry object on palm, wipe table and reach with wrist extention to grip    Baseline RD 0, Ext 2, UD 15 flexion 46, pronation 40, Sup 60 - progressing see flowsheet   Time 4   Period Weeks   Status On-going     OT SHORT TERM GOAL #4   Title Sensation improve in R hand to identify  ojbects in palm 50% of time    Baseline Tinel in diigts and palm    Time 4   Period Weeks   Status On-going           OT Long Term Goals - 09/27/16 1406      OT LONG TERM GOAL #1   Title Upgrade HEP to increase PIP and MC flexion as nerve is healing to grasp 6 inch object to 3 inch object   Baseline see flowsheet    Time 4   Period Weeks   Status On-going     OT LONG TERM GOAL #2   Title Wrist ROM improve and strength for pt to intiate using 1 lbs weight for wrist in all planes    Baseline 3/4 lbs for  wrist extention , 2 lbs for RD , and sup - not pronation    Time 6   Period Weeks   Status On-going     OT LONG TERM GOAL #3   Title Assess shoulder and upgrade HEP to decrease pain and compensation    Baseline HEP  provided - pt report getting better    Time 4   Period Weeks   Status On-going               Plan - 11/15/16 1556    Clinical Impression Statement Pt showed increase wrist extention compare to last time taken - and was able to do increase wrist extention with tool 701 on BTE this date - place and hold against gravity - scar tissue appear to decrease allowing increase 2nd digit flexion - still adhere with wrist flexion , ext , 3rd thru 5th digits flexion = had some soreness from yesterday session - held  off UD ,RD, and thumb lat pinch    Occupational performance deficits (Please refer to evaluation for details): ADL's;IADL's;Work;Play;Leisure   Rehab Potential Good   OT Frequency 2x / week   OT Duration 2 weeks   OT Treatment/Interventions Self-care/ADL training;Fluidtherapy;Splinting;Patient/family education;Therapeutic exercises;Ultrasound;Scar mobilization;Passive range of motion;Neuromuscular education;Electrical Stimulation;Manual Therapy   Plan assess scar - if soreness decreased    Clinical Decision Making Multiple treatment options, significant modification of task necessary   OT Home Exercise Plan see pt instruction   Consulted and Agree with Plan of Care Patient      Patient will benefit from skilled therapeutic intervention in order to improve the following deficits and impairments:  Decreased range of motion, Impaired flexibility, Decreased coordination, Decreased safety awareness, Increased edema, Impaired sensation, Decreased skin integrity, Decreased knowledge of precautions, Decreased knowledge of use of DME, Decreased scar mobility, Impaired UE functional use, Pain, Decreased strength, Impaired perceived functional ability  Visit Diagnosis: Stiffness of right wrist, not elsewhere classified  Stiffness of right hand, not elsewhere classified  Pain in right arm  Other disturbances of skin sensation  Scar condition and fibrosis of skin  Muscle weakness  (generalized)    Problem List There are no active problems to display for this patient.   Rosalyn Gess OTR/L,CLT 11/15/2016, 4:06 PM  [facility] [address] [place], [address] Phone: [phone]   Fax:  [phone]  Name: Francisco Johns MRN: [id number] Date of Birth: 05/08/1975

## 2016-11-19 ENCOUNTER — Ambulatory Visit: Payer: 59 | Admitting: Occupational Therapy

## 2016-11-19 DIAGNOSIS — M25641 Stiffness of right hand, not elsewhere classified: Secondary | ICD-10-CM | POA: Diagnosis not present

## 2016-11-19 DIAGNOSIS — R208 Other disturbances of skin sensation: Secondary | ICD-10-CM

## 2016-11-19 DIAGNOSIS — L905 Scar conditions and fibrosis of skin: Secondary | ICD-10-CM

## 2016-11-19 DIAGNOSIS — M25631 Stiffness of right wrist, not elsewhere classified: Secondary | ICD-10-CM | POA: Diagnosis not present

## 2016-11-19 DIAGNOSIS — M79601 Pain in right arm: Secondary | ICD-10-CM

## 2016-11-19 DIAGNOSIS — M6281 Muscle weakness (generalized): Secondary | ICD-10-CM

## 2016-11-20 ENCOUNTER — Encounter: Payer: Self-pay | Admitting: Occupational Therapy

## 2016-11-20 NOTE — Therapy (Signed)
[facility] [address] [place], [address] Phone: [phone]   Fax:  [phone]  Occupational Therapy Treatment  Patient Details  Name: Francisco Johns MRN: [id number] Date of Birth: 1975/10/06 Referring Provider: Vernona Rieger  Encounter Date: 11/19/2016      OT End of Session - 11/20/16 1933    Visit Number 29   Number of Visits 32   Date for OT Re-Evaluation 11/21/16   OT Start Time 1300   OT Stop Time 1400   OT Time Calculation (min) 60 min   Activity Tolerance Patient tolerated treatment well   Behavior During Therapy [facility] for tasks assessed/performed      Past Medical History:  Diagnosis Date  . Metatarsal fracture    right big toe    Past Surgical History:  Procedure Laterality Date  . ankle surgery    . NO PAST SURGERIES    . ORIF TOE FRACTURE Right 12/09/2015   Procedure: OPEN REDUCTION INTERNAL FIXATION (ORIF) METATARSAL (TOE) FRACTURE, first metatarsal;  Surgeon: Renette Butters, MD;  Location: [place];  Service: Orthopedics;  Laterality: Right;    There were no vitals filed for this visit.      Subjective Assessment - 11/20/16 1931    Subjective  Patient reports no soreness today.  Reports strap on splint was coming apart and needs new strapping.  States, "I forgot some of the exercises for my shoulder."   Patient Stated Goals I want as much movement and use I can get for my R dominant hand- to work , play with my kids, fish , play golf ,   Currently in Pain? No/denies   Pain Score 0-No pain        Moist heat to right hand and forearm prior to treatment session.  Assessed scar and where/how to apply kinesiotape for effectiveness. Kinesiotape this date applied on 2 areas on graft -that are adhere - with resistance to 3 thru 5th digits and wrist flexion - crossed 2 long at 100 % at each and 30%  Parallel to graft  - pt ed on precautions and extra tape provided for  wife that is RN to replace, will take off at night     BTE 701 at 0lbs - place and hold for wrist extension - 100 sec  , cues for form and technique.   Wrist , elbow extension with shoulder at 90 flexion  - stabilization with 2 kg ball 3 x 30 sec on wall.     PROM for thumb PA/RA including extension of IP  PROM of all digits - with focus on 5th - composite AROM and place and hold for thumb PA and RA     Gripper able to do this date - 14 lbs 120 sec - place and hold 3 sec - placing MC in partial flexion, cues for squeeze of grip and not pulling with compensatory movements. Large screwdriver for wrist extension 2 lbs 120 sec, knob for 120 sec forwards, backwards rotation, 2#, no pinches performed this date.   Reinstructed on pectoralis stretch with towel in supine, arm in ABD.  Shoulder stabilization exercises with place and hold with external perturbations for 10 reps, instructed on ABC exercise for 1-2 sets, able to demo in clinic with minimal cues. Retraction of scapula and cues for posture and reaching tasks promoting ABD of the arm rather than ADD.      Replaced strap on splint and issued  additional strap for home in case it separates again.   Estim to right UE for neuromuscular reeducation for 10 minutes, per clinical protocol with intensity of 20.                       OT Education - 11/20/16 1932    Education provided Yes   Education Details home exercise, kinesiotape, shoulder stabilization exercises.    Person(s) Educated Patient   Methods Explanation;Demonstration;Verbal cues   Comprehension Verbal cues required;Returned demonstration;Verbalized understanding          OT Short Term Goals - 09/27/16 1405      OT SHORT TERM GOAL #1   Title Pt to be ind in HEP to use splints correctly , increase ROM and sensatin in R hand and wrist    Baseline Tinel in palm and digits , splint wearing    Time 3   Period Weeks   Status On-going     OT SHORT TERM GOAL  #2   Title Fabricate/ modify or change splints as needed to prevent contractures in R hand and wrist while nerve healing occur   Baseline Webspace getting tight , IP of thumb flexor contracture - cont to assess if need to change   Time 3   Period Weeks   Status On-going     OT SHORT TERM GOAL #3   Title R wrist AROM and PROM improve with 10-15 degrees to be able carry object on palm, wipe table and reach with wrist extention to grip    Baseline RD 0, Ext 2, UD 15 flexion 46, pronation 40, Sup 60 - progressing see flowsheet   Time 4   Period Weeks   Status On-going     OT SHORT TERM GOAL #4   Title Sensation improve in R hand to identify  ojbects in palm 50% of time    Baseline Tinel in diigts and palm    Time 4   Period Weeks   Status On-going           OT Long Term Goals - 09/27/16 1406      OT LONG TERM GOAL #1   Title Upgrade HEP to increase PIP and MC flexion as nerve is healing to grasp 6 inch object to 3 inch object   Baseline see flowsheet    Time 4   Period Weeks   Status On-going     OT LONG TERM GOAL #2   Title Wrist ROM improve and strength for pt to intiate using 1 lbs weight for wrist in all planes    Baseline 3/4 lbs for wrist extention , 2 lbs for RD , and sup - not pronation    Time 6   Period Weeks   Status On-going     OT LONG TERM GOAL #3   Title Assess shoulder and upgrade HEP to decrease pain and compensation    Baseline HEP provided - pt report getting better    Time 4   Period Weeks   Status On-going               Plan - 11/20/16 1934    Clinical Impression Statement Patient continues to progress in all areas, decreased soreness today, performed knob tool on BTE but held pinches and will monitor to see how soreness is next session.  Patient was unable to recall exercises for shoulder stabilization from last session, therefore reinstructed and added additional exercises for home program.  Will continue  to work towards improving range of  motion and functional use of right UE.     Rehab Potential Good   OT Frequency 2x / week   OT Duration 2 weeks   OT Treatment/Interventions Self-care/ADL training;Fluidtherapy;Splinting;Patient/family education;Therapeutic exercises;Ultrasound;Scar mobilization;Passive range of motion;Neuromuscular education;Electrical Stimulation;Manual Therapy   Consulted and Agree with Plan of Care Patient      Patient will benefit from skilled therapeutic intervention in order to improve the following deficits and impairments:  Decreased range of motion, Impaired flexibility, Decreased coordination, Decreased safety awareness, Increased edema, Impaired sensation, Decreased skin integrity, Decreased knowledge of precautions, Decreased knowledge of use of DME, Decreased scar mobility, Impaired UE functional use, Pain, Decreased strength, Impaired perceived functional ability  Visit Diagnosis: Stiffness of right wrist, not elsewhere classified  Stiffness of right hand, not elsewhere classified  Pain in right arm  Other disturbances of skin sensation  Scar condition and fibrosis of skin  Muscle weakness (generalized)    Problem List There are no active problems to display for this patient. Reznor Ferrando Oneita Jolly, OTR/L, CLT  Rainna Nearhood 11/20/2016, 7:54 PM  [facility] [address], [place], [address] Phone: [phone]   Fax:  [phone]  Name: Francisco Johns MRN: [id number] Date of Birth: 1976/02/10

## 2016-11-21 ENCOUNTER — Ambulatory Visit: Payer: 59 | Attending: Plastic Surgery | Admitting: Occupational Therapy

## 2016-11-21 ENCOUNTER — Encounter: Payer: Self-pay | Admitting: Occupational Therapy

## 2016-11-21 DIAGNOSIS — R208 Other disturbances of skin sensation: Secondary | ICD-10-CM | POA: Diagnosis not present

## 2016-11-21 DIAGNOSIS — M6281 Muscle weakness (generalized): Secondary | ICD-10-CM | POA: Diagnosis not present

## 2016-11-21 DIAGNOSIS — M25631 Stiffness of right wrist, not elsewhere classified: Secondary | ICD-10-CM

## 2016-11-21 DIAGNOSIS — M79601 Pain in right arm: Secondary | ICD-10-CM

## 2016-11-21 DIAGNOSIS — L905 Scar conditions and fibrosis of skin: Secondary | ICD-10-CM | POA: Diagnosis not present

## 2016-11-21 DIAGNOSIS — M25641 Stiffness of right hand, not elsewhere classified: Secondary | ICD-10-CM

## 2016-11-24 NOTE — Therapy (Signed)
[facility] [address] [place], [address] Phone: [phone]   Fax:  [phone]  Occupational Therapy Treatment  Patient Details  Name: Francisco Johns MRN: [id number] Date of Birth: 1975/07/18 Referring Provider: Vernona Rieger  Encounter Date: 11/21/2016      OT End of Session - 11/24/16 1610    Visit Number 30   Number of Visits 32   Date for OT Re-Evaluation 11/21/16   OT Start Time 0246   OT Stop Time 0330   OT Time Calculation (min) 44 min   Activity Tolerance Patient tolerated treatment well   Behavior During Therapy [facility] for tasks assessed/performed      Past Medical History:  Diagnosis Date  . Metatarsal fracture    right big toe    Past Surgical History:  Procedure Laterality Date  . ankle surgery    . NO PAST SURGERIES    . ORIF TOE FRACTURE Right 12/09/2015   Procedure: OPEN REDUCTION INTERNAL FIXATION (ORIF) METATARSAL (TOE) FRACTURE, first metatarsal;  Surgeon: Renette Butters, MD;  Location: [facility] [facility];  Service: Orthopedics;  Laterality: Right;    There were no vitals filed for this visit.      Subjective Assessment - 11/24/16 1610    Subjective  No soreness this date, "none out of the normal".  Did some exercises for shoulder over the last day since being here.    Patient Stated Goals I want as much movement and use I can get for my R dominant hand- to work , play with my kids, fish , play golf ,   Currently in Pain? No/denies   Pain Score 0-No pain      Moist heat to right hand and forearm prior to treatment session, utilized coban for taping hand in a fisted position during heat for 5 minutes.    Assessed scar and where/how to apply kinesiotape for effectiveness. Kinesiotape this date applied on 2 areas on graft -that are adhere - with resistance to 3 thru 5th digits and wrist flexion - crossed 2 long at 100 % at each and 30%  Parallel to graft  - pt ed on  precautions and extra tape provided for wife that is RN to replace, will take off at night      BTE 701 at 0lbs - place and hold for wrist extension - 100 sec  , cues for form and technique.  601 handle for supination, pronation for 120 secs.  151 pinch tool for lateral pinch 120 secs.  Wrist, elbow extension with shoulder at 90 flexion  - stabilization with 2 kg ball 3 x 30 sec on wall.     PROM for thumb PA/RA including extension of IP  PROM of all digits - with focus on 5th - composite AROM and place and hold for thumb PA and RA     Gripper able to do this date - 15 lbs 120 sec - place and hold 3 sec - placing MC in partial flexion, cues for squeeze of grip and not pulling with compensatory movements. Large screwdriver for wrist extension 2 lbs 120 sec, knob for 120 sec forwards, backwards rotation, 2#,    Estim to right UE for neuromuscular reeducation for 10 minutes, per clinical protocol with intensity of 18.                          OT Education - 11/24/16 1610  Education provided Yes   Education Details HEP, taping   Person(s) Educated Patient   Methods Explanation;Demonstration;Verbal cues   Comprehension Verbalized understanding;Returned demonstration;Verbal cues required          OT Short Term Goals - 09/27/16 1405      OT SHORT TERM GOAL #1   Title Pt to be ind in HEP to use splints correctly , increase ROM and sensatin in R hand and wrist    Baseline Tinel in palm and digits , splint wearing    Time 3   Period Weeks   Status On-going     OT SHORT TERM GOAL #2   Title Fabricate/ modify or change splints as needed to prevent contractures in R hand and wrist while nerve healing occur   Baseline Webspace getting tight , IP of thumb flexor contracture - cont to assess if need to change   Time 3   Period Weeks   Status On-going     OT SHORT TERM GOAL #3   Title R wrist AROM and PROM improve with 10-15 degrees to be able carry object on  palm, wipe table and reach with wrist extention to grip    Baseline RD 0, Ext 2, UD 15 flexion 46, pronation 40, Sup 60 - progressing see flowsheet   Time 4   Period Weeks   Status On-going     OT SHORT TERM GOAL #4   Title Sensation improve in R hand to identify  ojbects in palm 50% of time    Baseline Tinel in diigts and palm    Time 4   Period Weeks   Status On-going           OT Long Term Goals - 09/27/16 1406      OT LONG TERM GOAL #1   Title Upgrade HEP to increase PIP and MC flexion as nerve is healing to grasp 6 inch object to 3 inch object   Baseline see flowsheet    Time 4   Period Weeks   Status On-going     OT LONG TERM GOAL #2   Title Wrist ROM improve and strength for pt to intiate using 1 lbs weight for wrist in all planes    Baseline 3/4 lbs for wrist extention , 2 lbs for RD , and sup - not pronation    Time 6   Period Weeks   Status On-going     OT LONG TERM GOAL #3   Title Assess shoulder and upgrade HEP to decrease pain and compensation    Baseline HEP provided - pt report getting better    Time 4   Period Weeks   Status On-going               Plan - 11/24/16 1611    Clinical Impression Statement Patient without soreness this date, included pinches with BTE this date and will monitor soreness.  Patient performing shoulder exercises at home for stabilization and reports he is doing well. He continues to progress in all areas, will plan to reassess goals and take measurements next session.  Continue to work towards improving ROM and functional use of RUE.     Rehab Potential Good   OT Frequency 2x / week   OT Duration 2 weeks   OT Treatment/Interventions Self-care/ADL training;Fluidtherapy;Splinting;Patient/family education;Therapeutic exercises;Ultrasound;Scar mobilization;Passive range of motion;Neuromuscular education;Electrical Stimulation;Manual Therapy   Consulted and Agree with Plan of Care Patient      Patient will benefit from  skilled therapeutic  intervention in order to improve the following deficits and impairments:  Decreased range of motion, Impaired flexibility, Decreased coordination, Decreased safety awareness, Increased edema, Impaired sensation, Decreased skin integrity, Decreased knowledge of precautions, Decreased knowledge of use of DME, Decreased scar mobility, Impaired UE functional use, Pain, Decreased strength, Impaired perceived functional ability  Visit Diagnosis: Stiffness of right wrist, not elsewhere classified  Pain in right arm  Stiffness of right hand, not elsewhere classified  Other disturbances of skin sensation  Scar condition and fibrosis of skin  Muscle weakness (generalized)    Problem List There are no active problems to display for this patient.  Amy Oneita Jolly, OTR/L, CLT  Lovett,Amy 11/24/2016, 4:27 PM  [facility] [address], [place], [address] Phone: [phone]   Fax:  [phone]  Name: Francisco Johns MRN: [id number] Date of Birth: [birth year]

## 2016-11-26 ENCOUNTER — Ambulatory Visit: Payer: 59 | Admitting: Occupational Therapy

## 2016-11-26 DIAGNOSIS — R208 Other disturbances of skin sensation: Secondary | ICD-10-CM | POA: Diagnosis not present

## 2016-11-26 DIAGNOSIS — M6281 Muscle weakness (generalized): Secondary | ICD-10-CM

## 2016-11-26 DIAGNOSIS — M25641 Stiffness of right hand, not elsewhere classified: Secondary | ICD-10-CM

## 2016-11-26 DIAGNOSIS — L905 Scar conditions and fibrosis of skin: Secondary | ICD-10-CM | POA: Diagnosis not present

## 2016-11-26 DIAGNOSIS — M25631 Stiffness of right wrist, not elsewhere classified: Secondary | ICD-10-CM

## 2016-11-26 DIAGNOSIS — M79601 Pain in right arm: Secondary | ICD-10-CM

## 2016-11-28 ENCOUNTER — Encounter: Payer: Self-pay | Admitting: Occupational Therapy

## 2016-11-29 ENCOUNTER — Ambulatory Visit: Payer: 59 | Admitting: Occupational Therapy

## 2016-11-29 DIAGNOSIS — R208 Other disturbances of skin sensation: Secondary | ICD-10-CM | POA: Diagnosis not present

## 2016-11-29 DIAGNOSIS — M6281 Muscle weakness (generalized): Secondary | ICD-10-CM | POA: Diagnosis not present

## 2016-11-29 DIAGNOSIS — M25631 Stiffness of right wrist, not elsewhere classified: Secondary | ICD-10-CM

## 2016-11-29 DIAGNOSIS — M79601 Pain in right arm: Secondary | ICD-10-CM | POA: Diagnosis not present

## 2016-11-29 DIAGNOSIS — M25641 Stiffness of right hand, not elsewhere classified: Secondary | ICD-10-CM | POA: Diagnosis not present

## 2016-11-29 DIAGNOSIS — L905 Scar conditions and fibrosis of skin: Secondary | ICD-10-CM

## 2016-11-29 NOTE — Therapy (Signed)
[place] [facility] [address], [place], [address] Phone: [phone]   Fax:  [phone]  Occupational Therapy Treatment  Patient Details  Name: Francisco Johns MRN: [id number] Date of Birth: 12-Mar-1976 Referring Provider: Vernona Rieger  Encounter Date: 11/29/2016      OT End of Session - 11/29/16 1020    Visit Number 32   Number of Visits 55   Date for OT Re-Evaluation 02/13/17   OT Start Time 0858   OT Stop Time 1005   OT Time Calculation (min) 67 min   Activity Tolerance Patient tolerated treatment well   Behavior During Therapy [facility] for tasks assessed/performed      Past Medical History:  Diagnosis Date  . Metatarsal fracture    right big toe    Past Surgical History:  Procedure Laterality Date  . ankle surgery    . NO PAST SURGERIES    . ORIF TOE FRACTURE Right 12/09/2015   Procedure: OPEN REDUCTION INTERNAL FIXATION (ORIF) METATARSAL (TOE) FRACTURE, first metatarsal;  Surgeon: Renette Butters, MD;  Location: [place];  Service: Orthopedics;  Laterality: Right;    There were no vitals filed for this visit.      Subjective Assessment - 11/29/16 0913    Subjective  Wrist stays more straight with making grip - more tender when you tap - trying to use it more but still so hard    Patient Stated Goals I want as much movement and use I can get for my R dominant hand- to work , play with my kids, fish , play golf ,   Currently in Pain? No/denies                      OT Treatments/Exercises (OP) - 11/29/16 0001      Moist Heat Therapy   Number Minutes Moist Heat 10 Minutes   Moist Heat Location Hand;Wrist  Flexion wrap to 4thand 5th piror      Soft tissue mobs using Graston tool nr 2 over radial side of mid forearm - gentle sweeping and brushing - ? Trigger point for FPL - over use during lat grip - hyper flexion of IP  Did block this date with kinesiotape and coban  during lat grip and UD - with less pain    Cont with cica scar pad at night time and during day on and off - larger one provided if not kinesiotape on  Assess scar and where to apply kinesiotape Kinesiotape this date applied on 2 areas on graft -that are adhere - with resistance to 3 thru 5th digits and wrist flexion - did across 2 long at 100 % at each and 30%  Parallel to graft - pt ed on precautions and extra provided for wife that is RN to replace- but take off at night - but left distal ulnar side of wrist open - and did x kinesiotape over proximal scar adhesion - with knot  DOne tape at [facility] prior to BTE - extra for pt to use next week   BTE 701 at 0lbs - place and hold for wrist extention -120 sec   Wrist , elbow extention with shoulder at 90 flexion - stabilzation on 2kg ball 3x 45 sec  Pt to do at home 3 x 45 sec No pain with above - just work out burn on dorsal forearm   PROM for thumb PA/RA including extention of IP  PROM  of all digits - with focus on 5th - composite AROM and place and hold for thumb PA and RA   Gripper able to do this date - 18lbs 120 sec - place and hold 3 sec - placing MC in partial flexion - not compensating as much with pulling this date  Large screwdriver for wrist extention 3 lbs 120 sec - coban fingers in flexoin Attempted sup with lat grip - but unable - with thumb and wrist  Pronation with D ring - increase to 3 lbs - 120 sec  Lat grip this date - 120 sec  coban and kinesiotape IP into extention - to block from hyper flexion- Large knob for UD -  coban and kinesiotape IP into extention - to block from hyper flexion-120 sec - and 3 lbs this date  : Cont with Shoulder 2 1/2 lbs cuff weight for pronation , ext rotation , tricep - add to HEP and need to keep wrist neutral  pect stretch and massage to be done  retraction of scapula Avoid doing task across body but out to side             OT Education - 11/29/16 1020    Education  provided Yes   Education Details USe coming week hand in sand at beach , try and use hand for finger food ,   Person(s) Educated Patient   Methods Explanation;Demonstration;Tactile cues;Verbal cues   Comprehension Verbal cues required;Returned demonstration;Verbalized understanding          OT Short Term Goals - 11/26/16 0933      OT SHORT TERM GOAL #1   Title Pt to be ind in HEP to use splints correctly , increase ROM and sensatin in R hand and wrist    Baseline Tinel in palm and digits , splint wearing    Time 3   Period Weeks   Status On-going     OT SHORT TERM GOAL #2   Title Fabricate/ modify or change splints as needed to prevent contractures in R hand and wrist while nerve healing occur   Baseline Webspace getting tight , IP of thumb flexor contracture - cont to assess if need to change   Time 3   Period Weeks   Status On-going     OT SHORT TERM GOAL #3   Title R wrist AROM and PROM improve with 10-15 degrees to be able carry object on palm, wipe table and reach with wrist extention to grip    Baseline RD 0, Ext 2, UD 15 flexion 46, pronation 40, Sup 60 - progressing see flowsheet   Time 4   Period Weeks   Status On-going     OT SHORT TERM GOAL #4   Title Sensation improve in R hand to identify  ojbects in palm 50% of time    Baseline Tinel in diigts and palm    Time 4   Period Weeks   Status On-going           OT Long Term Goals - 11/26/16 0934      OT LONG TERM GOAL #1   Title (P)  Upgrade HEP to increase PIP and MC flexion as nerve is healing to grasp 6 inch object to 3 inch object   Baseline (P)  see flowsheet    Time (P)  4   Period (P)  Weeks   Status (P)  On-going     OT LONG TERM GOAL #2   Title (P)  Wrist  ROM improve and strength for pt to intiate using 1 lbs weight for wrist in all planes    Baseline (P)  3/4 lbs for wrist extention , 2 lbs for RD , and sup - not pronation    Time (P)  6   Period (P)  Weeks   Status (P)  On-going     OT  LONG TERM GOAL #3   Title (P)  Assess shoulder and upgrade HEP to decrease pain and compensation    Baseline (P)  HEP provided - pt report getting better    Time (P)  4   Period (P)  Weeks   Status (P)  On-going               Plan - 11/29/16 1021    Clinical Impression Statement Pt had less soreness at mid radial forearm with blocking IP from hyper flexion during lat grip or large knob during ther ex - able to increase grip and wrist extention 504 tool on BTE - pt to use hand in sand at beach next week ,  tape scar - scar adhesions decreasing slowly - able to stabilize wrist neutral during grip -    Occupational performance deficits (Please refer to evaluation for details): ADL's;IADL's;Work;Play;Leisure   Rehab Potential Good   OT Frequency 2x / week   OT Duration 12 weeks   OT Treatment/Interventions Self-care/ADL training;Fluidtherapy;Splinting;Patient/family education;Therapeutic exercises;Ultrasound;Scar mobilization;Passive range of motion;Neuromuscular education;Electrical Stimulation;Manual Therapy   Plan block IP from hyper flexion , attempt again lat grip with supination , built up handles for utencils   OT Home Exercise Plan see pt instruction   Consulted and Agree with Plan of Care Patient      Patient will benefit from skilled therapeutic intervention in order to improve the following deficits and impairments:  Decreased range of motion, Impaired flexibility, Decreased coordination, Decreased safety awareness, Increased edema, Impaired sensation, Decreased skin integrity, Decreased knowledge of precautions, Decreased knowledge of use of DME, Decreased scar mobility, Impaired UE functional use, Pain, Decreased strength, Impaired perceived functional ability  Visit Diagnosis: Stiffness of right wrist, not elsewhere classified  Pain in right arm  Stiffness of right hand, not elsewhere classified  Other disturbances of skin sensation  Scar condition and fibrosis of  skin  Muscle weakness (generalized)    Problem List There are no active problems to display for this patient.   Rosalyn Gess OTR/L,CLT  11/29/2016, 10:25 AM  [place] [facility] [address], [place], [address] Phone: [phone]   Fax:  [phone]  Name: Francisco Johns MRN: [id number] Date of Birth: 04-Apr-1976

## 2016-11-29 NOTE — Patient Instructions (Signed)
assess wrist splint and webspacer - cleaned , adjusted padding at IP of thumb  And replace straps on all -review with pt wearing of splints - to wean gradually into benik blue more an less in hard wrist splint  Webspacer as needed    Cont with cica scar pad at night time and during day on and off - larger one provided if not kinesiotape on  Assess scar and where to apply kinesiotape Kinesiotape this date applied on 2 areas on graft -that are adhere - with resistance to 3 thru 5th digits and wrist flexion - did across 2 long at 100 % at each and 30%  Parallel to graft - pt ed on precautions and extra provided for wife that is RN to replace- but take off at night  DOne tape at [facility] prior to BTE and redo afterwards - come loose   BTE 701 at 0lbs - place and hold for wrist extention - 100 sec   Wrist , elbow extention with shoulder at 90 flexion - stabilzation on 2kg ball 3x 30 sec  Pt to do at home 3 x 45 sec No pain with above - just work out burn on dorsal forearm   PROM for thumb PA/RA including extention of IP  PROM of all digits - with focus on 5th - composite AROM and place and hold for thumb PA and RA   Gripper able to do this date - 14lbs 120 sec - place and hold 3 sec - placing MC in partial flexion - if more weight - pt pull instead of squeeze Large screwdriver for wrist extention 2 lbs 120 sec   Cont at home : Shoulder 2 1/2 lbs cuff weight for pronation , ext rotation , tricep - add to HEP and need to keep wrist neutral  pect stretch and massage to be done  retraction of scapula

## 2016-11-29 NOTE — Therapy (Signed)
[place] [facility] [address] [place], [address] Phone: [phone]   Fax:  [phone]  Occupational Therapy Treatment/Reassessment   Patient Details  Name: Francisco Johns MRN: [id number] Date of Birth: [birth year] Referring Provider: Vernona Rieger  Encounter Date: 11/26/2016      OT End of Session - 11/28/16 0915    Visit Number 31   Number of Visits 55   Date for OT Re-Evaluation 02/13/17   OT Start Time 0900   OT Stop Time 1010   OT Time Calculation (min) 70 min   Activity Tolerance Patient tolerated treatment well   Behavior During Therapy [facility] for tasks assessed/performed      Past Medical History:  Diagnosis Date  . Metatarsal fracture    right big toe    Past Surgical History:  Procedure Laterality Date  . ankle surgery    . NO PAST SURGERIES    . ORIF TOE FRACTURE Right 12/09/2015   Procedure: OPEN REDUCTION INTERNAL FIXATION (ORIF) METATARSAL (TOE) FRACTURE, first metatarsal;  Surgeon: Renette Butters, MD;  Location: [place];  Service: Orthopedics;  Laterality: Right;    There were no vitals filed for this visit.      Subjective Assessment - 11/28/16 1800    Subjective  Patient reports not much change over the last couple weeks but he reports this has been normal for the course, he tends to make progress, slows down a bit and then sees progress again.  He reports he is able to hold his wrist up more when fisting.  Patient will be going on vacation with his family next week to the beach.  Discussed keeping arm covered to avoid sun/burn on his grafted arm.    Patient Stated Goals I want as much movement and use I can get for my R dominant hand- to work , play with my kids, fish , play golf ,   Currently in Pain? No/denies   Pain Score 0-No pain      Moist heat to right hand and forearm prior to treatment session, utilized coban for taping hand in a fisted position during heat  for 5 minutes.   Reassessment as follows: Supination of forearm on right to 75 degrees, pronation to 70 degrees Wrist to neutral for extension, 60 degrees for flexion. Radial deviation 5, ulnar dev 20 degrees. Thumb flexion 25 degrees. Index MP to 0 degrees, flexion to 60 degrees, PIP -20 to 85, DIP -30 to 65. MF MP to 0 degrees, flexion to 60, PIP -30 to 85, DIP -30 to 45 RF MP 0-55, PIP 0-80, DIP 0 with no flexion SF full extension, MP flexion 10 degrees, PIP 50, DIP no flexion No pain today at shoulder, occasionally a 1-2 level at night and sleeping better.      Continued to Assess scar and where/how to apply kinesiotape for effectiveness. Kinesiotape this date applied on 2 areas on graft -that are adhere - with resistance to 3 thru 5th digits and wrist flexion - crossed 2 long at 100 % at each and 30%  Parallel to graft  - pt ed on precautions and extra tape provided for wife that is RN to replace, will take off at night      BTE 601 handle for supination, pronation for 120 secs.  151 pinch tool for lateral pinch 120 secs.  302 knob both directions Wrist, elbow extension with shoulder at 90 flexion  - stabilization with 2  kg ball 3 x 30 sec on wall.     PROM for thumb PA/RA including extension of IP  PROM of all digits - with focus on 5th - composite AROM and place and hold for thumb PA and RA     Grip tool 162 - 15 lbs 120 sec - place and hold 3 sec - placing MC in partial flexion, cues for squeeze of grip and not pulling with compensatory movements. Large screwdriver for wrist extension 2 lbs 120 sec, knob for 120 sec forwards, backwards rotation, 2#,                           OT Education - 11/29/16 0816    Education provided Yes          OT Short Term Goals - 11/26/16 0933      OT SHORT TERM GOAL #1   Title Pt to be ind in HEP to use splints correctly , increase ROM and sensatin in R hand and wrist    Baseline Tinel in palm and digits , splint  wearing    Time 3   Period Weeks   Status On-going     OT SHORT TERM GOAL #2   Title Fabricate/ modify or change splints as needed to prevent contractures in R hand and wrist while nerve healing occur   Baseline Webspace getting tight , IP of thumb flexor contracture - cont to assess if need to change   Time 3   Period Weeks   Status On-going     OT SHORT TERM GOAL #3   Title R wrist AROM and PROM improve with 10-15 degrees to be able carry object on palm, wipe table and reach with wrist extention to grip    Baseline RD 0, Ext 2, UD 15 flexion 46, pronation 40, Sup 60 - progressing see flowsheet   Time 4   Period Weeks   Status On-going     OT SHORT TERM GOAL #4   Title Sensation improve in R hand to identify  ojbects in palm 50% of time    Baseline Tinel in diigts and palm    Time 4   Period Weeks   Status On-going           OT Long Term Goals - 11/26/16 0934      OT LONG TERM GOAL #1   Title (P)  Upgrade HEP to increase PIP and MC flexion as nerve is healing to grasp 6 inch object to 3 inch object   Baseline (P)  see flowsheet    Time (P)  4   Period (P)  Weeks   Status (P)  On-going     OT LONG TERM GOAL #2   Title (P)  Wrist ROM improve and strength for pt to intiate using 1 lbs weight for wrist in all planes    Baseline (P)  3/4 lbs for wrist extention , 2 lbs for RD , and sup - not pronation    Time (P)  6   Period (P)  Weeks   Status (P)  On-going     OT LONG TERM GOAL #3   Title (P)  Assess shoulder and upgrade HEP to decrease pain and compensation    Baseline (P)  HEP provided - pt report getting better    Time (P)  4   Period (P)  Weeks   Status (P)  On-going  Plan - 11/28/16 4503    Clinical Impression Statement Patient has continued to make good progress in all areas.  shoulder now with decreased pain, ROM improved, strength improving each week.  He is now able to keep wrist up when performing exercises and moving into a fisting  position.  Patient's shoulder is much better with pain now at 1-2 level and mostly at night.  He has sensation in the hand to detect an object is there and feels "cold" but he still has no discrimination to the object or weight of object.  Patient continues to benefit from skilled OT and will need to Continue to work towards goals in plan of care to improve right hand function for use in daily tasks.      Rehab Potential Good   OT Frequency 2x / week   OT Duration 12 weeks   OT Treatment/Interventions Self-care/ADL training;Fluidtherapy;Splinting;Patient/family education;Therapeutic exercises;Ultrasound;Scar mobilization;Passive range of motion;Neuromuscular education;Electrical Stimulation;Manual Therapy   Consulted and Agree with Plan of Care Patient      Patient will benefit from skilled therapeutic intervention in order to improve the following deficits and impairments:  Decreased range of motion, Impaired flexibility, Decreased coordination, Decreased safety awareness, Increased edema, Impaired sensation, Decreased skin integrity, Decreased knowledge of precautions, Decreased knowledge of use of DME, Decreased scar mobility, Impaired UE functional use, Pain, Decreased strength, Impaired perceived functional ability  Visit Diagnosis: Stiffness of right wrist, not elsewhere classified  Pain in right arm  Stiffness of right hand, not elsewhere classified  Other disturbances of skin sensation  Scar condition and fibrosis of skin  Muscle weakness (generalized)    Problem List There are no active problems to display for this patient. Amy Oneita Jolly, OTR/L, CLT   Lovett,Amy 11/29/2016, 8:29 AM  [place] [facility] [address], [place], [address] Phone: [phone]   Fax:  [phone]  Name: JAMAURION SLEMMER MRN: [id number] Date of Birth: [birth year]

## 2016-12-10 ENCOUNTER — Ambulatory Visit: Payer: 59 | Admitting: Occupational Therapy

## 2016-12-10 DIAGNOSIS — M6281 Muscle weakness (generalized): Secondary | ICD-10-CM

## 2016-12-10 DIAGNOSIS — M79601 Pain in right arm: Secondary | ICD-10-CM | POA: Diagnosis not present

## 2016-12-10 DIAGNOSIS — M25631 Stiffness of right wrist, not elsewhere classified: Secondary | ICD-10-CM

## 2016-12-10 DIAGNOSIS — M25641 Stiffness of right hand, not elsewhere classified: Secondary | ICD-10-CM

## 2016-12-10 DIAGNOSIS — R208 Other disturbances of skin sensation: Secondary | ICD-10-CM | POA: Diagnosis not present

## 2016-12-10 DIAGNOSIS — L905 Scar conditions and fibrosis of skin: Secondary | ICD-10-CM

## 2016-12-10 NOTE — Patient Instructions (Addendum)
same HEP

## 2016-12-10 NOTE — Therapy (Signed)
[facility] [address] [place], [address] Phone: [phone]   Fax:  [phone]  Occupational Therapy Treatment  Patient Details  Name: Francisco Johns MRN: [id number] Date of Birth: Aug 29, 1975 Referring Provider: Vernona Rieger  Encounter Date: 12/10/2016      OT End of Session - 12/10/16 0828    Visit Number 33   Number of Visits 55   Date for OT Re-Evaluation 02/13/17   OT Start Time 0808   OT Stop Time 0915   OT Time Calculation (min) 67 min   Activity Tolerance Patient tolerated treatment well   Behavior During Therapy [facility] for tasks assessed/performed      Past Medical History:  Diagnosis Date  . Metatarsal fracture    right big toe    Past Surgical History:  Procedure Laterality Date  . ankle surgery    . NO PAST SURGERIES    . ORIF TOE FRACTURE Right 12/09/2015   Procedure: OPEN REDUCTION INTERNAL FIXATION (ORIF) METATARSAL (TOE) FRACTURE, first metatarsal;  Surgeon: Renette Butters, MD;  Location: [place];  Service: Orthopedics;  Laterality: Right;    There were no vitals filed for this visit.      Subjective Assessment - 12/10/16 0824    Subjective  Wrist stays more straight with making grip - more tender when you tap - trying to use it more but still so hard -    Patient Stated Goals I want as much movement and use I can get for my R dominant hand- to work , play with my kids, fish , play golf ,   Currently in Pain? No/denies                      OT Treatments/Exercises (OP) - 12/10/16 0001      Moist Heat Therapy   Number Minutes Moist Heat 10 Minutes   Moist Heat Location Hand;Wrist  Flexion wrap to 4thadn 5th        Cont with cica scar pad at night time and during day on and off - larger one provided if not kinesiotape on  Assess scar and where to apply kinesiotape Kinesiotape this date applied on 2 areas on graft -that are adhere - with  resistance to 3 thru 5th digits and wrist flexion - did across 2 long at 100 % at each and 100%  across proximal and distal to scar to achor middle 2  - pt ed on precautions and extra provided for wife that is RN to replace- but take off at night - but left distal ulnar side of wrist open - and did x kinesiotape over proximal scar adhesion - with knot  Done heat for flexion of 5th at [facility] again    BTE 701 at 0lbs - place and hold for wrist extention -120 sec  x 2   Wrist , elbow extention with shoulder at 90 flexion - stabilzation on 2kg ball 3x 45 sec  No pain with above - just work out burn on dorsal forearm   PROM for thumb PA/RA including extention of IP  PROM of all digits - with focus on 5th - composite AROM and place and hold for thumb PA and RA Pt show partial thumb IP extention this date    Gripper  - increase to 22lbs 120 sec - place and hold 3 sec - placing MC in partial flexion - not compensating as much with pulling  this date  Large screwdriver for wrist extention 3 lbs 120 sec - coban fingers in flexoin - struggle this date some what but could do it  Pronation with D ring - increase to 4 lbs - 120 sec  Increase sup 5 lbs - D ring - 120 sec  Lat grip this date - 120 sec  coban and kinesiotape IP into extention - to block from hyper flexion- 12 lbs Large knob for UD ( 5 lbs)  and RD ( 3 lbs)-  coban and kinesiotape IP into extention - to block from hyper flexion-120 sec  Estim at end see un attended - protocol at 16.5 current - 2 x 2 cm proximal and wrist on extensors  :            OT Education - 12/10/16 0828    Education provided Yes   Education Details place and hold IP extention of thumb    Person(s) Educated Patient   Methods Explanation;Demonstration;Tactile cues;Verbal cues   Comprehension Verbal cues required;Returned demonstration;Verbalized understanding          OT Short Term Goals - 11/26/16 0933      OT SHORT TERM GOAL #1   Title Pt  to be ind in HEP to use splints correctly , increase ROM and sensatin in R hand and wrist    Baseline Tinel in palm and digits , splint wearing    Time 3   Period Weeks   Status On-going     OT SHORT TERM GOAL #2   Title Fabricate/ modify or change splints as needed to prevent contractures in R hand and wrist while nerve healing occur   Baseline Webspace getting tight , IP of thumb flexor contracture - cont to assess if need to change   Time 3   Period Weeks   Status On-going     OT SHORT TERM GOAL #3   Title R wrist AROM and PROM improve with 10-15 degrees to be able carry object on palm, wipe table and reach with wrist extention to grip    Baseline RD 0, Ext 2, UD 15 flexion 46, pronation 40, Sup 60 - progressing see flowsheet   Time 4   Period Weeks   Status On-going     OT SHORT TERM GOAL #4   Title Sensation improve in R hand to identify  ojbects in palm 50% of time    Baseline Tinel in diigts and palm    Time 4   Period Weeks   Status On-going           OT Long Term Goals - 11/26/16 0934      OT LONG TERM GOAL #1   Title (P)  Upgrade HEP to increase PIP and MC flexion as nerve is healing to grasp 6 inch object to 3 inch object   Baseline (P)  see flowsheet    Time (P)  4   Period (P)  Weeks   Status (P)  On-going     OT LONG TERM GOAL #2   Title (P)  Wrist ROM improve and strength for pt to intiate using 1 lbs weight for wrist in all planes    Baseline (P)  3/4 lbs for wrist extention , 2 lbs for RD , and sup - not pronation    Time (P)  6   Period (P)  Weeks   Status (P)  On-going     OT LONG TERM GOAL #3   Title (P)  Assess  shoulder and upgrade HEP to decrease pain and compensation    Baseline (P)  HEP provided - pt report getting better    Time (P)  4   Period (P)  Weeks   Status (P)  On-going               Plan - 12/10/16 0828    Clinical Impression Statement Pt was out of town last week - this date with reassesment - showed partial AROM of  thumb IP extention , and report can feel textures little better- wrist extention AROM increase -and flexion of 4th - will assess Thornell Mule next tine , key grip with supaintion combination , IP extention of thumb ,  and cont scar mobs - pt has appt with surgeon this week    Occupational performance deficits (Please refer to evaluation for details): ADL's;IADL's;Work;Play;Leisure   Rehab Potential Good   OT Frequency 2x / week   OT Duration 12 weeks   OT Treatment/Interventions Self-care/ADL training;Fluidtherapy;Splinting;Patient/family education;Therapeutic exercises;Ultrasound;Scar mobilization;Passive range of motion;Neuromuscular education;Electrical Stimulation;Manual Therapy   Clinical Decision Making Multiple treatment options, significant modification of task necessary   OT Home Exercise Plan see pt instruction   Consulted and Agree with Plan of Care Patient      Patient will benefit from skilled therapeutic intervention in order to improve the following deficits and impairments:  Decreased range of motion, Impaired flexibility, Decreased coordination, Decreased safety awareness, Increased edema, Impaired sensation, Decreased skin integrity, Decreased knowledge of precautions, Decreased knowledge of use of DME, Decreased scar mobility, Impaired UE functional use, Pain, Decreased strength, Impaired perceived functional ability  Visit Diagnosis: Stiffness of right wrist, not elsewhere classified  Pain in right arm  Stiffness of right hand, not elsewhere classified  Other disturbances of skin sensation  Scar condition and fibrosis of skin  Muscle weakness (generalized)    Problem List There are no active problems to display for this patient.   Rosalyn Gess OTR/L,CLT 12/10/2016, 11:05 AM  [place] [facility] [address] [place], [address] Phone: [phone]   Fax:  [phone]  Name: NOLAND PIZANO MRN: [id number] Date of Birth: 11-25-1975

## 2016-12-12 DIAGNOSIS — S68411D Complete traumatic amputation of right hand at wrist level, subsequent encounter: Secondary | ICD-10-CM | POA: Diagnosis not present

## 2016-12-14 ENCOUNTER — Ambulatory Visit: Payer: 59 | Admitting: Occupational Therapy

## 2016-12-14 DIAGNOSIS — M79601 Pain in right arm: Secondary | ICD-10-CM

## 2016-12-14 DIAGNOSIS — M25631 Stiffness of right wrist, not elsewhere classified: Secondary | ICD-10-CM

## 2016-12-14 DIAGNOSIS — L905 Scar conditions and fibrosis of skin: Secondary | ICD-10-CM

## 2016-12-14 DIAGNOSIS — R208 Other disturbances of skin sensation: Secondary | ICD-10-CM | POA: Diagnosis not present

## 2016-12-14 DIAGNOSIS — M6281 Muscle weakness (generalized): Secondary | ICD-10-CM | POA: Diagnosis not present

## 2016-12-14 DIAGNOSIS — M25641 Stiffness of right hand, not elsewhere classified: Secondary | ICD-10-CM

## 2016-12-14 NOTE — Therapy (Signed)
[place] [facility] [address] [place], [address] Phone: [phone]   Fax:  [phone]  Occupational Therapy Treatment  Patient Details  Name: Francisco Johns MRN: [id number] Date of Birth: 28-Apr-1975 Referring Provider: Vernona Rieger  Encounter Date: 12/14/2016      OT End of Session - 12/14/16 0902    Visit Number 34   Number of Visits 55   Date for OT Re-Evaluation 02/13/17   OT Start Time 0814   OT Stop Time 0933   OT Time Calculation (min) 79 min   Activity Tolerance Patient tolerated treatment well   Behavior During Therapy [facility] for tasks assessed/performed      Past Medical History:  Diagnosis Date  . Metatarsal fracture    right big toe    Past Surgical History:  Procedure Laterality Date  . ankle surgery    . NO PAST SURGERIES    . ORIF TOE FRACTURE Right 12/09/2015   Procedure: OPEN REDUCTION INTERNAL FIXATION (ORIF) METATARSAL (TOE) FRACTURE, first metatarsal;  Surgeon: Renette Butters, MD;  Location: [place];  Service: Orthopedics;  Laterality: Right;    There were no vitals filed for this visit.      Subjective Assessment - 12/14/16 0851    Subjective  Seen DR - he was very happy with progress - nerves doing great - considering my age - to start using it more - I was sore after last time after my week vacation    Patient Stated Goals I want as much movement and use I can get for my R dominant hand- to work , play with my kids, fish , play golf ,   Currently in Pain? No/denies            [facility] OT Assessment - 12/14/16 0001      AROM   Right Forearm Pronation 60 Degrees   Right Forearm Supination 80 Degrees   Right Wrist Extension 47 Degrees   Left Wrist Radial Deviation 25 Degrees   Left Wrist Ulnar Deviation 8 Degrees     Strength   Right Hand Grip (lbs) 9   Right Hand Lateral Pinch 2.5 lbs   Left Hand Grip (lbs) 105   Left Hand Lateral Pinch 23 lbs   Left  Hand 3 Point Pinch 1 lbs                  OT Treatments/Exercises (OP) - 12/14/16 0001      Moist Heat Therapy   Number Minutes Moist Heat 10 Minutes   Moist Heat Location Hand;Wrist  pronation stretch and digits flexion stretch       Grip and prehension assess  Wrist AROM ext, sup /pro RD, UD assess  Scar assess - Graston tool nr 2 done on volar aspect around scar  Splint straps assess and replace - pt to discharge night wrist splint - wear as needed - but cont webspacer  And Benik soft during day as needed if  Wrist colapse into flexion   Assess weight pt can carry - could do about 5 lbs under 2-3/10 pull- and lift - but 6 and 7 lbs pull was about 5/10 - pt to use bilateral hands  Can get 3 lbs - to use at home  3 lbs for sup and support to finish pronation - use Benik soft splint on - 10 reps   add to EHP  And for tricep and bicep use 3 lbs  Isometric for wrist extention - agains other hand or table /counter during day - done in clinic 10 reps    Assess scar and where to apply kinesiotape Kinesiotape this date applied on 2 areas on graft -that are adhere - with resistance to 3 thru 5th digits and wrist flexion - did across 2 long at 100 % at each and 100%  across proximal and distal to scar to achor middle 2  - pt ed on precautions and extra provided for wife that is RN to replace- but take off at night - but left distal ulnar side of wrist open - and did x kinesiotape over proximal scar adhesion - with knot  Done heat for flexion of 5th at [facility] again    BTE 701 at 0lbs - place and hold for wrist extention -120sec  x 2   Wrist , elbow extention with shoulder at 90 flexion - stabilzation on 2kg ball 1x 45sec  No pain with above - just work out burn on dorsal forearm   PROM for thumb PA/RA including extention of IP  PROM of all digits - with focus on 5th - composite AROM and place and hold for thumb PA and RA Pt show partial thumb IP extention this date -  isometric done    Gripper  -  22lbs 120 sec - place and hold 3 sec - placing MC in partial flexion - not compensating as much with pulling this date  Large screwdriver for wrist extention 3lbs 120 sec - coban fingers in flexoin  Pronation with D ring -  4 lbs - 120 sec    Lat grip this date - 120 sec coban and kinesiotape IP into extention - to block from hyper flexion- 12 lbs  Estim at end see un attended - protocol at 16.5 current - 2 x 2 cm proximal and wrist on extensors  :            OT Education - 12/14/16 0901    Education provided Yes   Education Details isometric for wrist extention - carrying assess about 4 lbs    Person(s) Educated Patient   Methods Explanation;Demonstration;Tactile cues;Verbal cues   Comprehension Verbal cues required;Returned demonstration;Verbalized understanding          OT Short Term Goals - 11/26/16 0933      OT SHORT TERM GOAL #1   Title Pt to be ind in HEP to use splints correctly , increase ROM and sensatin in R hand and wrist    Baseline Tinel in palm and digits , splint wearing    Time 3   Period Weeks   Status On-going     OT SHORT TERM GOAL #2   Title Fabricate/ modify or change splints as needed to prevent contractures in R hand and wrist while nerve healing occur   Baseline Webspace getting tight , IP of thumb flexor contracture - cont to assess if need to change   Time 3   Period Weeks   Status On-going     OT SHORT TERM GOAL #3   Title R wrist AROM and PROM improve with 10-15 degrees to be able carry object on palm, wipe table and reach with wrist extention to grip    Baseline RD 0, Ext 2, UD 15 flexion 46, pronation 40, Sup 60 - progressing see flowsheet   Time 4   Period Weeks   Status On-going     OT SHORT TERM GOAL #4   Title Sensation  improve in R hand to identify  ojbects in palm 50% of time    Baseline Tinel in diigts and palm    Time 4   Period Weeks   Status On-going           OT Long Term  Goals - 11/26/16 0934      OT LONG TERM GOAL #1   Title (P)  Upgrade HEP to increase PIP and MC flexion as nerve is healing to grasp 6 inch object to 3 inch object   Baseline (P)  see flowsheet    Time (P)  4   Period (P)  Weeks   Status (P)  On-going     OT LONG TERM GOAL #2   Title (P)  Wrist ROM improve and strength for pt to intiate using 1 lbs weight for wrist in all planes    Baseline (P)  3/4 lbs for wrist extention , 2 lbs for RD , and sup - not pronation    Time (P)  6   Period (P)  Weeks   Status (P)  On-going     OT LONG TERM GOAL #3   Title (P)  Assess shoulder and upgrade HEP to decrease pain and compensation    Baseline (P)  HEP provided - pt report getting better    Time (P)  4   Period (P)  Weeks   Status (P)  On-going               Plan - 12/14/16 0902    Clinical Impression Statement Pt grp and lat grip assess this date - unable to do 3 point - wrist extention improving and can carry up to 6 lbs in hand but wrist flexion compensate and pull on wrist increase to 5/10 - pt to use hand more and upgrade HEP    Occupational performance deficits (Please refer to evaluation for details): ADL's;IADL's;Play;Leisure;Work   Rehab Potential Good   OT Frequency 2x / week   OT Duration 4 weeks   OT Treatment/Interventions Self-care/ADL training;Fluidtherapy;Splinting;Patient/family education;Therapeutic exercises;Ultrasound;Scar mobilization;Passive range of motion;Neuromuscular education;Electrical Stimulation;Manual Therapy   Plan assess how doing with upgrade of HEP    OT Home Exercise Plan see pt instruction   Consulted and Agree with Plan of Care Patient      Patient will benefit from skilled therapeutic intervention in order to improve the following deficits and impairments:  Decreased range of motion, Impaired flexibility, Decreased coordination, Decreased safety awareness, Increased edema, Impaired sensation, Decreased skin integrity, Decreased knowledge of  precautions, Decreased knowledge of use of DME, Decreased scar mobility, Impaired UE functional use, Pain, Decreased strength, Impaired perceived functional ability  Visit Diagnosis: Stiffness of right wrist, not elsewhere classified  Pain in right arm  Stiffness of right hand, not elsewhere classified  Other disturbances of skin sensation  Scar condition and fibrosis of skin  Muscle weakness (generalized)    Problem List There are no active problems to display for this patient.   Rosalyn Gess OTR/L,CLT 12/14/2016, 12:16 PM  [place] [facility] [address], [place], [address] Phone: [phone]   Fax:  [phone]  Name: Francisco Johns MRN: [id number] Date of Birth: [birth year]

## 2016-12-14 NOTE — Patient Instructions (Addendum)
pt to discharge night wrist splint - wear as needed - but cont webspacer  And Benik soft during day as needed if  Wrist colapse into flexion   Assess weight pt can carry - could do about 5 lbs under 2-3/10 pull- and lift - but 6 and 7 lbs pull was about 5/10 - pt to use bilateral hands  Can get 3 lbs - to use at home  3 lbs for sup and support to finish pronation - use Benik soft splint on - 10 reps   add to EHP  And for tricep and bicep use 3 lbs  Isometric for wrist extention - agains other hand or table /counter during day - done in clinic 10 reps    Cont with taping for scar  Pt show partial thumb IP extention this date - isometric done     :

## 2016-12-17 ENCOUNTER — Ambulatory Visit: Payer: 59 | Admitting: Occupational Therapy

## 2016-12-17 DIAGNOSIS — L905 Scar conditions and fibrosis of skin: Secondary | ICD-10-CM | POA: Diagnosis not present

## 2016-12-17 DIAGNOSIS — M25631 Stiffness of right wrist, not elsewhere classified: Secondary | ICD-10-CM | POA: Diagnosis not present

## 2016-12-17 DIAGNOSIS — M6281 Muscle weakness (generalized): Secondary | ICD-10-CM | POA: Diagnosis not present

## 2016-12-17 DIAGNOSIS — M79601 Pain in right arm: Secondary | ICD-10-CM

## 2016-12-17 DIAGNOSIS — M25641 Stiffness of right hand, not elsewhere classified: Secondary | ICD-10-CM

## 2016-12-17 DIAGNOSIS — R208 Other disturbances of skin sensation: Secondary | ICD-10-CM | POA: Diagnosis not present

## 2016-12-17 NOTE — Patient Instructions (Addendum)
  Add using 3 lbs weight for UD and RD of wrist to side

## 2016-12-17 NOTE — Therapy (Signed)
[facility] [address], [place], [address] Phone: [phone]   Fax:  [phone]  Occupational Therapy Treatment  Patient Details  Name: Francisco Johns MRN: [id number] Date of Birth: 1975-10-20 Referring Provider: Vernona Rieger  Encounter Date: 12/17/2016      OT End of Session - 12/17/16 0942    Visit Number 35   Number of Visits 55   Date for OT Re-Evaluation 02/13/17   OT Start Time 0930   OT Stop Time 1043   OT Time Calculation (min) 73 min   Activity Tolerance Patient tolerated treatment well   Behavior During Therapy [facility] for tasks assessed/performed      Past Medical History:  Diagnosis Date  . Metatarsal fracture    right big toe    Past Surgical History:  Procedure Laterality Date  . ankle surgery    . NO PAST SURGERIES    . ORIF TOE FRACTURE Right 12/09/2015   Procedure: OPEN REDUCTION INTERNAL FIXATION (ORIF) METATARSAL (TOE) FRACTURE, first metatarsal;  Surgeon: Renette Butters, MD;  Location: [place];  Service: Orthopedics;  Laterality: Right;    There were no vitals filed for this visit.      Subjective Assessment - 12/17/16 0939    Subjective   It was not to sore after last time -the first time- it looks like the index and middle finger can go more straight    Patient Stated Goals I want as much movement and use I can get for my R dominant hand- to work , play with my kids, fish , play golf ,   Currently in Pain? No/denies                      OT Treatments/Exercises (OP) - 12/17/16 0001      Moist Heat Therapy   Number Minutes Moist Heat 10 Minutes   Moist Heat Location Hand;Wrist  flexion in fist - and wrist pronation stretch       Scar assess - Graston tool nr 2 done on volar aspect around scar  And vibration done on proximal knot and scar - great success - And Benik soft during day as needed if  Wrist colapse into flexion  Isometric for  wrist extention - agains other hand or table /counter during day - done in clinic 10 reps    Assess scar and where to apply kinesiotape Kinesiotape this date applied on 2 areas on graft -that are adhere - with resistance to 3 thru 5th digits and wrist flexion - did across 2 long at 100 % at each and 100%  across proximal and distal to scar to achor middle 2 - pt ed on precautions and extra provided for wife that is RN to replace- but take off at night - but left distal ulnar side of wrist open - and did x kinesiotape over proximal scar adhesion - with knot  Done heat for flexion of 5th at [facility] again   BTE 701 at 0lbs - place and hold for wrist extention -120sec  x 2   Wrist , elbow extention with shoulder at 90 flexion - stabilzation on 2kg ball 1x 45sec  No pain with above - just work out burn on dorsal forearm   PROM for thumb PA/RA including extention of IP  PROM of all digits - with focus on 5th - composite AROM and place and hold for thumb PA and RA Pt  show partial thumb IP extention this date - isometric done   Large knob at 5 lbs for RD, UD - 120 sec each  Done 3 lbs with arm to side - for RD,UD - small movement - and keep wrist straight - look in mirror at home - want to compensate with wrist flexion - during UD  Gripper -  22lbs 120 sec - place and hold 3 sec - placing MC in partial flexion - not compensating as much with pulling this date  - x 2 done  Large screwdriver for wrist extention 3lbs 120 sec - coban fingers in flexoin only 5th and 4th this date - done 2 x  Pronation/supination  with D ring -  5 lbs - 120 sec each   Lat grip this date - 120 sec coban and kinesiotape IP into extention - to block from hyper flexion- 12 lbs  Estim at end see un attended - protocol at 17.5 current - 2 x 2 cm proximal and wrist on extensors  :             OT Education - 12/17/16 0942    Education provided Yes   Education Details progress and HEP update     Person(s) Educated Patient   Methods Explanation;Demonstration;Tactile cues   Comprehension Returned demonstration;Verbalized understanding          OT Short Term Goals - 11/26/16 0933      OT SHORT TERM GOAL #1   Title Pt to be ind in HEP to use splints correctly , increase ROM and sensatin in R hand and wrist    Baseline Tinel in palm and digits , splint wearing    Time 3   Period Weeks   Status On-going     OT SHORT TERM GOAL #2   Title Fabricate/ modify or change splints as needed to prevent contractures in R hand and wrist while nerve healing occur   Baseline Webspace getting tight , IP of thumb flexor contracture - cont to assess if need to change   Time 3   Period Weeks   Status On-going     OT SHORT TERM GOAL #3   Title R wrist AROM and PROM improve with 10-15 degrees to be able carry object on palm, wipe table and reach with wrist extention to grip    Baseline RD 0, Ext 2, UD 15 flexion 46, pronation 40, Sup 60 - progressing see flowsheet   Time 4   Period Weeks   Status On-going     OT SHORT TERM GOAL #4   Title Sensation improve in R hand to identify  ojbects in palm 50% of time    Baseline Tinel in diigts and palm    Time 4   Period Weeks   Status On-going           OT Long Term Goals - 11/26/16 0934      OT LONG TERM GOAL #1   Title (P)  Upgrade HEP to increase PIP and MC flexion as nerve is healing to grasp 6 inch object to 3 inch object   Baseline (P)  see flowsheet    Time (P)  4   Period (P)  Weeks   Status (P)  On-going     OT LONG TERM GOAL #2   Title (P)  Wrist ROM improve and strength for pt to intiate using 1 lbs weight for wrist in all planes    Baseline (P)  3/4 lbs for  wrist extention , 2 lbs for RD , and sup - not pronation    Time (P)  6   Period (P)  Weeks   Status (P)  On-going     OT LONG TERM GOAL #3   Title (P)  Assess shoulder and upgrade HEP to decrease pain and compensation    Baseline (P)  HEP provided - pt report  getting better    Time (P)  4   Period (P)  Weeks   Status (P)  On-going               Plan - 12/17/16 0943    Clinical Impression Statement Pt cont to show progress - increase 2nd and 3rd digit IP extention - with still able to have same amount of flexion - fisting - also showing increase wrist extention - maintaining easier neutral position for wrist during act    Occupational performance deficits (Please refer to evaluation for details): ADL's;IADL's;Play;Leisure;Work   Rehab Potential Good   OT Frequency 2x / week   OT Duration 4 weeks   OT Treatment/Interventions Self-care/ADL training;Fluidtherapy;Splinting;Patient/family education;Therapeutic exercises;Ultrasound;Scar mobilization;Passive range of motion;Neuromuscular education;Electrical Stimulation;Manual Therapy   Clinical Decision Making Multiple treatment options, significant modification of task necessary   OT Home Exercise Plan see pt instruction   Consulted and Agree with Plan of Care Patient      Patient will benefit from skilled therapeutic intervention in order to improve the following deficits and impairments:  Decreased range of motion, Impaired flexibility, Decreased coordination, Decreased safety awareness, Increased edema, Impaired sensation, Decreased skin integrity, Decreased knowledge of precautions, Decreased knowledge of use of DME, Decreased scar mobility, Impaired UE functional use, Pain, Decreased strength, Impaired perceived functional ability  Visit Diagnosis: Stiffness of right wrist, not elsewhere classified  Pain in right arm  Stiffness of right hand, not elsewhere classified  Other disturbances of skin sensation  Scar condition and fibrosis of skin  Muscle weakness (generalized)    Problem List There are no active problems to display for this patient.   Rosalyn Gess OTR/L,CLT 12/17/2016, 10:38 AM  [facility] [address], [place], [address] Phone: [phone]   Fax:  [phone]  Name: YANIXAN MELLINGER MRN: [id number] Date of Birth: [birth year]

## 2016-12-20 ENCOUNTER — Ambulatory Visit: Payer: 59 | Admitting: Occupational Therapy

## 2016-12-20 DIAGNOSIS — L905 Scar conditions and fibrosis of skin: Secondary | ICD-10-CM | POA: Diagnosis not present

## 2016-12-20 DIAGNOSIS — M25631 Stiffness of right wrist, not elsewhere classified: Secondary | ICD-10-CM | POA: Diagnosis not present

## 2016-12-20 DIAGNOSIS — M6281 Muscle weakness (generalized): Secondary | ICD-10-CM | POA: Diagnosis not present

## 2016-12-20 DIAGNOSIS — R208 Other disturbances of skin sensation: Secondary | ICD-10-CM | POA: Diagnosis not present

## 2016-12-20 DIAGNOSIS — M25641 Stiffness of right hand, not elsewhere classified: Secondary | ICD-10-CM

## 2016-12-20 DIAGNOSIS — M79601 Pain in right arm: Secondary | ICD-10-CM

## 2016-12-20 NOTE — Patient Instructions (Signed)
Same HEP  

## 2016-12-20 NOTE — Therapy (Signed)
[facility] [address], [place], [address] Phone: [phone]   Fax:  [phone]  Occupational Therapy Treatment  Patient Details  Name: Francisco Johns MRN: [id number] Date of Birth: 03/06/1976 Referring Provider: Vernona Rieger  Encounter Date: 12/20/2016      OT End of Session - 12/20/16 0826    Visit Number 36   Number of Visits 55   Date for OT Re-Evaluation 02/13/17   OT Start Time 0810   OT Stop Time 0915   OT Time Calculation (min) 65 min   Activity Tolerance Patient tolerated treatment well   Behavior During Therapy [facility] for tasks assessed/performed      Past Medical History:  Diagnosis Date  . Metatarsal fracture    right big toe    Past Surgical History:  Procedure Laterality Date  . ankle surgery    . NO PAST SURGERIES    . ORIF TOE FRACTURE Right 12/09/2015   Procedure: OPEN REDUCTION INTERNAL FIXATION (ORIF) METATARSAL (TOE) FRACTURE, first metatarsal;  Surgeon: Renette Butters, MD;  Location: [place];  Service: Orthopedics;  Laterality: Right;    There were no vitals filed for this visit.      Subjective Assessment - 12/20/16 0818    Subjective  about the same - trying to use it more    Patient Stated Goals I want as much movement and use I can get for my R dominant hand- to work , play with my kids, fish , play golf ,   Currently in Pain? No/denies                      OT Treatments/Exercises (OP) - 12/20/16 0001      Moist Heat Therapy   Number Minutes Moist Heat 10 Minutes   Moist Heat Location Hand;Wrist    Scar assess - Graston tool nr 2 done on volar aspect around scar  And vibration done on proximal knot and scar - great success - And Benik soft during day as needed if Wrist colapse into flexion  Isometric for wrist extention - agains other hand or table /counter during day - done in clinic 10 reps   Assess scar and where to apply  kinesiotape Kinesiotape this date applied on 2 areas on graft -that are adhere - with resistance to 3 thru 5th digits and wrist flexion - did across 2 long at 100 % at each and 100%  across proximal and distal to scar to achor middle 2 - pt ed on precautions and extra provided for wife that is RN to replace- but take off at night - but left distal ulnar side of wrist open - and did x kinesiotape over proximal scar adhesion - with knot  Done heat for flexion of 5th at [facility] again   BTE 701 at 0lbs - place and hold for wrist extention -120sec  x 2   Wrist , elbow extention with shoulder at 90 flexion - stabilzation on 2kg ball 1x 45sec  No pain with above - just work out burn on dorsal forearm   PROM for thumb PA/RA including extention of IP  PROM of all digits - with focus on 5th - composite AROM and place and hold for thumb PA and RA Pt show partial thumb IP extention this date - isometric done   Large knob at 5 lbs for RD, UD - 120 sec each  Done 3 lbs with  arm to side - for RD,UD - ( hard time to keep extended ) small movement - and keep wrist straight - look in mirror at home - want to compensate with wrist flexion - during UD  Gripper - 25lbs 120 sec - place and hold 3 sec - placing MC in partial flexion - not compensating as much with pulling this date  - x 2 done   Tool 504 done wrist extention this date on coban in for wrist extention 120 sec  Tool 504 but pronation with coban tape on 4th and 5th to increase flexion - 240 at 2 lbs  Supination on tool 202( key) - with lat grip and sup - uisng wrist and thumb wrap - 120 sec  Unable to do pronation    Lat grip this date - 120 sec coban and kinesiotape IP into extention - to block from hyper flexion- 12 lbs  Estim at end see un attended - protocol at 17.5 current - 2 x 2 cm proximal and wrist on extensors  :               OT Education - 12/20/16 0826    Education provided Yes   Education Details  update with HEP    Person(s) Educated Patient   Methods Explanation;Demonstration;Tactile cues;Verbal cues   Comprehension Verbal cues required;Returned demonstration;Verbalized understanding          OT Short Term Goals - 11/26/16 0933      OT SHORT TERM GOAL #1   Title Pt to be ind in HEP to use splints correctly , increase ROM and sensatin in R hand and wrist    Baseline Tinel in palm and digits , splint wearing    Time 3   Period Weeks   Status On-going     OT SHORT TERM GOAL #2   Title Fabricate/ modify or change splints as needed to prevent contractures in R hand and wrist while nerve healing occur   Baseline Webspace getting tight , IP of thumb flexor contracture - cont to assess if need to change   Time 3   Period Weeks   Status On-going     OT SHORT TERM GOAL #3   Title R wrist AROM and PROM improve with 10-15 degrees to be able carry object on palm, wipe table and reach with wrist extention to grip    Baseline RD 0, Ext 2, UD 15 flexion 46, pronation 40, Sup 60 - progressing see flowsheet   Time 4   Period Weeks   Status On-going     OT SHORT TERM GOAL #4   Title Sensation improve in R hand to identify  ojbects in palm 50% of time    Baseline Tinel in diigts and palm    Time 4   Period Weeks   Status On-going           OT Long Term Goals - 11/26/16 0934      OT LONG TERM GOAL #1   Title (P)  Upgrade HEP to increase PIP and MC flexion as nerve is healing to grasp 6 inch object to 3 inch object   Baseline (P)  see flowsheet    Time (P)  4   Period (P)  Weeks   Status (P)  On-going     OT LONG TERM GOAL #2   Title (P)  Wrist ROM improve and strength for pt to intiate using 1 lbs weight for wrist in all planes  Baseline (P)  3/4 lbs for wrist extention , 2 lbs for RD , and sup - not pronation    Time (P)  6   Period (P)  Weeks   Status (P)  On-going     OT LONG TERM GOAL #3   Title (P)  Assess shoulder and upgrade HEP to decrease pain and  compensation    Baseline (P)  HEP provided - pt report getting better    Time (P)  4   Period (P)  Weeks   Status (P)  On-going               Plan - 12/20/16 0827    Clinical Impression Statement Pt showing progress in wrist strength - but scar still adhere and limiting grip in combination with wrist extention - showing increase 5th PIP flexion to 45 degrees    Occupational performance deficits (Please refer to evaluation for details): ADL's;IADL's;Play;Leisure;Work   Rehab Potential Good   OT Frequency 2x / week   OT Duration 4 weeks   OT Treatment/Interventions Self-care/ADL training;Fluidtherapy;Splinting;Patient/family education;Therapeutic exercises;Ultrasound;Scar mobilization;Passive range of motion;Neuromuscular education;Electrical Stimulation;Manual Therapy   Plan assess progress and upgrade HEP    Clinical Decision Making Multiple treatment options, significant modification of task necessary   OT Home Exercise Plan see pt instruction   Consulted and Agree with Plan of Care Patient      Patient will benefit from skilled therapeutic intervention in order to improve the following deficits and impairments:  Decreased range of motion, Impaired flexibility, Decreased coordination, Decreased safety awareness, Increased edema, Impaired sensation, Decreased skin integrity, Decreased knowledge of precautions, Decreased knowledge of use of DME, Decreased scar mobility, Impaired UE functional use, Pain, Decreased strength, Impaired perceived functional ability  Visit Diagnosis: Stiffness of right wrist, not elsewhere classified  Pain in right arm  Stiffness of right hand, not elsewhere classified  Other disturbances of skin sensation  Scar condition and fibrosis of skin  Muscle weakness (generalized)    Problem List There are no active problems to display for this patient.   Rosalyn Gess OTR/L,CLT 12/20/2016, 9:12 AM  [facility] [address], [place], [address] Phone: [phone]   Fax:  [phone]  Name: Francisco Johns MRN: [id number] Date of Birth: [birth year]

## 2016-12-26 ENCOUNTER — Ambulatory Visit: Payer: 59 | Attending: Plastic Surgery | Admitting: Occupational Therapy

## 2016-12-26 DIAGNOSIS — M79601 Pain in right arm: Secondary | ICD-10-CM | POA: Insufficient documentation

## 2016-12-26 DIAGNOSIS — L905 Scar conditions and fibrosis of skin: Secondary | ICD-10-CM | POA: Insufficient documentation

## 2016-12-26 DIAGNOSIS — M25641 Stiffness of right hand, not elsewhere classified: Secondary | ICD-10-CM | POA: Diagnosis not present

## 2016-12-26 DIAGNOSIS — M25631 Stiffness of right wrist, not elsewhere classified: Secondary | ICD-10-CM | POA: Insufficient documentation

## 2016-12-26 DIAGNOSIS — R208 Other disturbances of skin sensation: Secondary | ICD-10-CM | POA: Diagnosis not present

## 2016-12-26 DIAGNOSIS — M6281 Muscle weakness (generalized): Secondary | ICD-10-CM | POA: Insufficient documentation

## 2016-12-26 NOTE — Patient Instructions (Signed)
Wrap hand in fist during 3 lbs weight exercises for wrist and elbow - and keep wrist in some extention during exercises  Cont kinestiotape on scar  Tubi grip provided for utensils to enlarge grip to use hand more  Buddy strap during gripping on 5th to 4th

## 2016-12-26 NOTE — Therapy (Signed)
[facility] [address] [place], [address] Phone: [phone]   Fax:  [phone]  Occupational Therapy Treatment  Patient Details  Name: BRAEDAN MEUTH MRN: [id number] Date of Birth: [birth year] Referring Provider: Vernona Rieger  Encounter Date: 12/26/2016      OT End of Session - 12/26/16 1538    Visit Number 37   Number of Visits 55   Date for OT Re-Evaluation 02/13/17   OT Start Time 1403   OT Stop Time 1513   OT Time Calculation (min) 70 min   Activity Tolerance Patient tolerated treatment well   Behavior During Therapy [facility] for tasks assessed/performed      Past Medical History:  Diagnosis Date  . Metatarsal fracture    right big toe    Past Surgical History:  Procedure Laterality Date  . ankle surgery    . NO PAST SURGERIES    . ORIF TOE FRACTURE Right 12/09/2015   Procedure: OPEN REDUCTION INTERNAL FIXATION (ORIF) METATARSAL (TOE) FRACTURE, first metatarsal;  Surgeon: Renette Butters, MD;  Location: [place];  Service: Orthopedics;  Laterality: Right;    There were no vitals filed for this visit.      Subjective Assessment - 12/26/16 1535    Subjective  Doing about the same - scar  I think improve at that middle adhesion - and index and middle finger can go more straight    Patient Stated Goals I want as much movement and use I can get for my R dominant hand- to work , play with my kids, fish , play golf ,   Currently in Pain? No/denies                      OT Treatments/Exercises (OP) - 12/26/16 0001      Moist Heat Therapy   Number Minutes Moist Heat 10 Minutes   Moist Heat Location Hand;Wrist  in flexion stretch in heat for increase 5th flexion       Scar assess - Graston tool nr 2 done on volar aspect around scar proximal with thumb PA but to tender  And vibration done on proximal knot and scar - great success - And Benik soft during day as needed  if Wrist colapse into flexion  Assess use of tubigrip to enlarge knife and fork - able to use it - but during knife cutting wrist collapses into flexion To wear wrist splint   Assess scar and where to apply kinesiotape Kinesiotape this date applied on 2 areas on graft -that are adhere - with resistance to 3 thru 5th digits, thumb PA  and wrist flexion - did across 2 long at 100 % at each and 100%  across proximal and distal to scar to achor middle 2 - pt ed on precautions and extra provided for wife that is RN to replace- but take off at night - but left distal ulnar side of wrist open - and did x kinesiotape over proximal scar adhesion - with knot  Done heat for flexion of 5th at [facility] again   BTE 701 at 0lbs - place and hold for wrist extention -120sec  x 2 - wrapping hand into fist this date  Tool 504 not wrap into fist this date - and could do 120 sec  At 3 lbs   Large knob at 5 lbs for RD, UD - 120 sec each in horizontal position this date  Done  3 lbs with arm to side - for RD,UD - and elbow HEP with bandage wrap to keep hand in fist - able to maintain with 50% v/c hand in some extention  Gripper - 25lbs 120 sec - place and hold 3 sec - placing MC in partial flexion - not compensating as much with pulling this date -    Lat grip this date - 120 sec coban and kinesiotape IP into extention - to block from hyper flexion- 12 lbs  Estim at end see un attended - protocol at 17.5 current - 2 x 2 cm proximal and wrist on extensors  Buddy strap provided this date for 5th to be use when gripping or driving during day - for 30 min at time  :            OT Education - 12/26/16 1537    Education provided Yes   Education Details HEP wrap hand in fist to work on extention of wrist more - tubi grip to enlarge grips at home    [employer]) Educated Patient   Methods Demonstration;Tactile cues;Explanation   Comprehension Returned demonstration;Verbal cues required           OT Short Term Goals - 11/26/16 0933      OT SHORT TERM GOAL #1   Title Pt to be ind in HEP to use splints correctly , increase ROM and sensatin in R hand and wrist    Baseline Tinel in palm and digits , splint wearing    Time 3   Period Weeks   Status On-going     OT SHORT TERM GOAL #2   Title Fabricate/ modify or change splints as needed to prevent contractures in R hand and wrist while nerve healing occur   Baseline Webspace getting tight , IP of thumb flexor contracture - cont to assess if need to change   Time 3   Period Weeks   Status On-going     OT SHORT TERM GOAL #3   Title R wrist AROM and PROM improve with 10-15 degrees to be able carry object on palm, wipe table and reach with wrist extention to grip    Baseline RD 0, Ext 2, UD 15 flexion 46, pronation 40, Sup 60 - progressing see flowsheet   Time 4   Period Weeks   Status On-going     OT SHORT TERM GOAL #4   Title Sensation improve in R hand to identify  ojbects in palm 50% of time    Baseline Tinel in diigts and palm    Time 4   Period Weeks   Status On-going           OT Long Term Goals - 11/26/16 0934      OT LONG TERM GOAL #1   Title (P)  Upgrade HEP to increase PIP and MC flexion as nerve is healing to grasp 6 inch object to 3 inch object   Baseline (P)  see flowsheet    Time (P)  4   Period (P)  Weeks   Status (P)  On-going     OT LONG TERM GOAL #2   Title (P)  Wrist ROM improve and strength for pt to intiate using 1 lbs weight for wrist in all planes    Baseline (P)  3/4 lbs for wrist extention , 2 lbs for RD , and sup - not pronation    Time (P)  6   Period (P)  Weeks   Status (P)  On-going     OT LONG TERM GOAL #3   Title (P)  Assess shoulder and upgrade HEP to decrease pain and compensation    Baseline (P)  HEP provided - pt report getting better    Time (P)  4   Period (P)  Weeks   Status (P)  On-going               Plan - 12/26/16 1539    Clinical Impression Statement Pt  showed increase wrist extention during BTE and 3 lbs weight with hand wrap into flexion - scar tissue improving - but scar tissue adhesion proximal hindering thumb PA - cont to make slow but steady progress -able to use spoon , fork and knife with tubigrip - but wrist collapse in flexion    Occupational performance deficits (Please refer to evaluation for details): ADL's;IADL's;Work;Play;Leisure   Rehab Potential Good   OT Frequency 2x / week   OT Duration 4 weeks   OT Treatment/Interventions Self-care/ADL training;Fluidtherapy;Splinting;Patient/family education;Therapeutic exercises;Ultrasound;Scar mobilization;Passive range of motion;Neuromuscular education;Electrical Stimulation;Manual Therapy   Plan cont to upgrade ther ex    Clinical Decision Making Multiple treatment options, significant modification of task necessary   OT Home Exercise Plan see pt instruction   Consulted and Agree with Plan of Care Patient      Patient will benefit from skilled therapeutic intervention in order to improve the following deficits and impairments:  Decreased range of motion, Impaired flexibility, Decreased coordination, Decreased safety awareness, Increased edema, Impaired sensation, Decreased skin integrity, Decreased knowledge of precautions, Decreased knowledge of use of DME, Decreased scar mobility, Impaired UE functional use, Pain, Decreased strength, Impaired perceived functional ability  Visit Diagnosis: Stiffness of right wrist, not elsewhere classified  Pain in right arm  Stiffness of right hand, not elsewhere classified  Other disturbances of skin sensation  Scar condition and fibrosis of skin  Muscle weakness (generalized)    Problem List There are no active problems to display for this patient.   Rosalyn Gess OTR/L,CLT  12/26/2016, 3:42 PM  [place] [address] [place], [address] Phone: [phone]    Fax:  [phone]  Name: KILEY TORRENCE MRN: [id number] Date of Birth: 13-Aug-1975

## 2016-12-28 ENCOUNTER — Ambulatory Visit: Payer: 59 | Admitting: Occupational Therapy

## 2016-12-28 DIAGNOSIS — M79601 Pain in right arm: Secondary | ICD-10-CM

## 2016-12-28 DIAGNOSIS — M25631 Stiffness of right wrist, not elsewhere classified: Secondary | ICD-10-CM | POA: Diagnosis not present

## 2016-12-28 DIAGNOSIS — M25641 Stiffness of right hand, not elsewhere classified: Secondary | ICD-10-CM

## 2016-12-28 DIAGNOSIS — L905 Scar conditions and fibrosis of skin: Secondary | ICD-10-CM | POA: Diagnosis not present

## 2016-12-28 DIAGNOSIS — M6281 Muscle weakness (generalized): Secondary | ICD-10-CM

## 2016-12-28 DIAGNOSIS — R208 Other disturbances of skin sensation: Secondary | ICD-10-CM

## 2016-12-28 NOTE — Patient Instructions (Signed)
Same HEP as last time  

## 2016-12-28 NOTE — Therapy (Signed)
[facility] [address], [place], [address] Phone: [phone]   Fax:  [phone]  Occupational Therapy Treatment  Patient Details  Name: ADVAIT BUICE MRN: [id number] Date of Birth: [birth year] Referring Provider: Vernona Rieger  Encounter Date: 12/28/2016      OT End of Session - 12/28/16 1624    Visit Number 38   Number of Visits 55   Date for OT Re-Evaluation 02/13/17   OT Start Time 1001   OT Stop Time 1105   OT Time Calculation (min) 64 min   Activity Tolerance Patient tolerated treatment well   Behavior During Therapy [facility] for tasks assessed/performed      Past Medical History:  Diagnosis Date  . Metatarsal fracture    right big toe    Past Surgical History:  Procedure Laterality Date  . ankle surgery    . NO PAST SURGERIES    . ORIF TOE FRACTURE Right 12/09/2015   Procedure: OPEN REDUCTION INTERNAL FIXATION (ORIF) METATARSAL (TOE) FRACTURE, first metatarsal;  Surgeon: Renette Butters, MD;  Location: [place];  Service: Orthopedics;  Laterality: Right;    There were no vitals filed for this visit.      Subjective Assessment - 12/28/16 1623    Subjective  Doing okay - trying to use it more    Patient Stated Goals I want as much movement and use I can get for my R dominant hand- to work , play with my kids, fish , play golf ,   Currently in Pain? No/denies                      OT Treatments/Exercises (OP) - 12/28/16 0001      Moist Heat Therapy   Number Minutes Moist Heat 10 Minutes   Moist Heat Location Hand;Wrist  hand in flexion and pronation with heat       Scar assess - Graston tool nr 2 done on volar aspect around scar proximal with thumb PA but to tender  And vibration done on proximal knot and scar - great success - And Benik soft during day as needed if Wrist colapse into flexion    Assess scar and where to apply kinesiotape Kinesiotape  this date applied on 2 areas on graft -that are adhere - with resistance to 3 thru 5th digits, thumb PA  and wrist flexion - did across 2 long at 100 % at each and 100%  across proximal and distal to scar to achor middle 2 - pt ed on precautions and extra provided for wife that is RN to replace- but take off at night - but left distal ulnar side of wrist open - and did x kinesiotape over proximal scar adhesion - with knot  Done heat for flexion of 5th at [facility] again   BTE 701 at 2lbs - place and hold for wrist extention -120sec  x 2 - wrapping hand into fist this date  Tool 504 not wrap into fist this date - and could do 120 sec  At 3 lbs   Large knob at 5 lbs for RD, UD - 120 sec each in horizontal position this date - taped IP of thumb into extention  Done 4 lbs with arm to side - for RD,UD - and elbow HEP with bandage wrap to keep hand in fist - able to maintain with 50% v/c hand in some extention  Gripper - 28lbs 120 sec -  place and hold 3 sec - placing MC in partial flexion - not compensating as much with pulling this date -    Lat grip this date - 120 sec coban and kinesiotape IP into extention - to block from hyper flexion- 12 lbs  Estim at end see un attended - protocol at 17.5 current - 2 x 2 cm proximal and wrist on extensors  Buddy strap to cont with   for 5th to be use when gripping or driving during day - for 30 min at time  :             OT Education - 12/28/16 1624    Education provided Yes   Education Details HEP and to use 2 point pinch to pick up    Person(s) Educated Patient   Methods Explanation;Demonstration;Tactile cues;Verbal cues   Comprehension Returned demonstration;Verbalized understanding          OT Short Term Goals - 11/26/16 0933      OT SHORT TERM GOAL #1   Title Pt to be ind in HEP to use splints correctly , increase ROM and sensatin in R hand and wrist    Baseline Tinel in palm and digits , splint wearing    Time 3    Period Weeks   Status On-going     OT SHORT TERM GOAL #2   Title Fabricate/ modify or change splints as needed to prevent contractures in R hand and wrist while nerve healing occur   Baseline Webspace getting tight , IP of thumb flexor contracture - cont to assess if need to change   Time 3   Period Weeks   Status On-going     OT SHORT TERM GOAL #3   Title R wrist AROM and PROM improve with 10-15 degrees to be able carry object on palm, wipe table and reach with wrist extention to grip    Baseline RD 0, Ext 2, UD 15 flexion 46, pronation 40, Sup 60 - progressing see flowsheet   Time 4   Period Weeks   Status On-going     OT SHORT TERM GOAL #4   Title Sensation improve in R hand to identify  ojbects in palm 50% of time    Baseline Tinel in diigts and palm    Time 4   Period Weeks   Status On-going           OT Long Term Goals - 11/26/16 0934      OT LONG TERM GOAL #1   Title (P)  Upgrade HEP to increase PIP and MC flexion as nerve is healing to grasp 6 inch object to 3 inch object   Baseline (P)  see flowsheet    Time (P)  4   Period (P)  Weeks   Status (P)  On-going     OT LONG TERM GOAL #2   Title (P)  Wrist ROM improve and strength for pt to intiate using 1 lbs weight for wrist in all planes    Baseline (P)  3/4 lbs for wrist extention , 2 lbs for RD , and sup - not pronation    Time (P)  6   Period (P)  Weeks   Status (P)  On-going     OT LONG TERM GOAL #3   Title (P)  Assess shoulder and upgrade HEP to decrease pain and compensation    Baseline (P)  HEP provided - pt report getting better    Time (P)  4  Period (P)  Weeks   Status (P)  On-going               Plan - 12/28/16 1625    Clinical Impression Statement Pt show increase 2nd digit extention after flexion - able to do 2 lbs for wrist extention if hand wrap into flexion - pt to use 2 point pinch to pick up and grasp - not only lat grip now    Occupational performance deficits (Please refer to  evaluation for details): ADL's;IADL's;Work;Play;Leisure   Rehab Potential Good   OT Frequency 2x / week   OT Duration 4 weeks   OT Treatment/Interventions Self-care/ADL training;Fluidtherapy;Splinting;Patient/family education;Therapeutic exercises;Ultrasound;Scar mobilization;Passive range of motion;Neuromuscular education;Electrical Stimulation;Manual Therapy   Plan ocnt reassess and upgrade    Clinical Decision Making Multiple treatment options, significant modification of task necessary   OT Home Exercise Plan see pt instruction   Consulted and Agree with Plan of Care Patient      Patient will benefit from skilled therapeutic intervention in order to improve the following deficits and impairments:  Decreased range of motion, Impaired flexibility, Decreased coordination, Decreased safety awareness, Increased edema, Impaired sensation, Decreased skin integrity, Decreased knowledge of precautions, Decreased knowledge of use of DME, Decreased scar mobility, Impaired UE functional use, Pain, Decreased strength, Impaired perceived functional ability  Visit Diagnosis: Stiffness of right wrist, not elsewhere classified  Pain in right arm  Stiffness of right hand, not elsewhere classified  Other disturbances of skin sensation  Scar condition and fibrosis of skin  Muscle weakness (generalized)    Problem List There are no active problems to display for this patient.   Rosalyn Gess OTR/L,CLT 12/28/2016, 4:27 PM  [facility] [address] [place], [address] Phone: [phone]   Fax:  [phone]  Name: HAMED DEBELLA MRN: [id number] Date of Birth: [birth year]

## 2017-01-01 ENCOUNTER — Ambulatory Visit: Payer: 59 | Admitting: Occupational Therapy

## 2017-01-01 DIAGNOSIS — M25631 Stiffness of right wrist, not elsewhere classified: Secondary | ICD-10-CM | POA: Diagnosis not present

## 2017-01-01 DIAGNOSIS — M79601 Pain in right arm: Secondary | ICD-10-CM

## 2017-01-01 DIAGNOSIS — M6281 Muscle weakness (generalized): Secondary | ICD-10-CM

## 2017-01-01 DIAGNOSIS — R208 Other disturbances of skin sensation: Secondary | ICD-10-CM | POA: Diagnosis not present

## 2017-01-01 DIAGNOSIS — M25641 Stiffness of right hand, not elsewhere classified: Secondary | ICD-10-CM | POA: Diagnosis not present

## 2017-01-01 DIAGNOSIS — L905 Scar conditions and fibrosis of skin: Secondary | ICD-10-CM

## 2017-01-01 NOTE — Therapy (Signed)
[facility] [address], [place], [address] Phone: [phone]   Fax:  [phone]  Occupational Therapy Treatment  Patient Details  Name: Francisco Johns MRN: [id number] Date of Birth: [birth year] Referring Provider: Vernona Rieger  Encounter Date: 01/01/2017      OT End of Session - 01/01/17 1023    Visit Number 39   Number of Visits 55   Date for OT Re-Evaluation 02/13/17   OT Start Time 0955   OT Stop Time 1102   OT Time Calculation (min) 67 min   Activity Tolerance Patient tolerated treatment well   Behavior During Therapy [facility] for tasks assessed/performed      Past Medical History:  Diagnosis Date  . Metatarsal fracture    right big toe    Past Surgical History:  Procedure Laterality Date  . ankle surgery    . NO PAST SURGERIES    . ORIF TOE FRACTURE Right 12/09/2015   Procedure: OPEN REDUCTION INTERNAL FIXATION (ORIF) METATARSAL (TOE) FRACTURE, first metatarsal;  Surgeon: Renette Butters, MD;  Location: [place];  Service: Orthopedics;  Laterality: Right;    There were no vitals filed for this visit.      Subjective Assessment - 01/01/17 1022    Subjective  doing okay - using my hand to ear - depends how hungry I am - doing 3 lbs for my wrist    Patient Stated Goals I want as much movement and use I can get for my R dominant hand- to work , play with my kids, fish , play golf ,   Currently in Pain? No/denies            [facility] OT Assessment - 01/01/17 0001      Strength   Right Hand Grip (lbs) 13  with support under    Right Hand Lateral Pinch 4 lbs  on large pinch meter     Right Hand AROM   R Index  MCP 0-90 85 Degrees   R Index PIP 0-100 --  -15 to 100   R Long  MCP 0-90 80 Degrees   R Long PIP 0-100 --  -20 to 92   R Ring  MCP 0-90 75 Degrees   R Ring PIP 0-100 --  0 to 90   R Little  MCP 0-90 60 Degrees       R Digits AROM measured and grip / lat grip  - see flowsheet  Scar assess -   Assess scar and where to apply kinesiotape Kinesiotape this date applied on 2 areas on graft -that are adhere - with resistance to 3 thru 5th digits, thumb PA and wrist flexion - did across 2 long at 100 % at each and 100%  across proximal and distal to scar to achor middle 2 - pt ed on precautions and extra provided for wife that is RN to replace- but take off at night - but left distal ulnar side of wrist open - and did x kinesiotape over proximal scar adhesion - with knot  Done heat for flexion of 5th at [facility] again   BTE 701 at 2lbs - place and hold for wrist extention -120sec  x 2 - wrapping hand into fist for one of them - other one open hand Tool 504 not wrap into fist this date - and could do 120 sec -At 3 lbs   Large knob increase to  6 lbs for RD, UD -  120 sec each in horizontal position this date - taped IP of thumb into extention  Gripper - 28lbs 120 sec - place and hold 3 sec - placing MC in partial flexion - not compensating as much with pulling this date - D ring for sup increase to 5 lbs 120 sec  And pronation 5 lbs 120 sec   Lat grip this date - 120 sec coban and kinesiotape IP into extention - to block from hyper flexion- 12 lbs - and pt to focus on pad - and not rotation of thumb with pinch  Estim at end see un attended - protocol at 14 .5 current - 2 x 2 cm thumb thenar eminence - and proximal forearm for Thumb PA  Buddy strap to cont with   for 5th to be use when gripping or driving during day - for 30 min at time  :                      OT Education - 01/01/17 1022    Education provided Yes   Education Details HEP and to use hand as much - wrist keep in neutral during gripping exercises    Person(s) Educated Patient   Methods Explanation   Comprehension Returned demonstration;Verbalized understanding          OT Short Term Goals - 11/26/16 0933      OT SHORT TERM GOAL #1   Title Pt to be ind  in HEP to use splints correctly , increase ROM and sensatin in R hand and wrist    Baseline Tinel in palm and digits , splint wearing    Time 3   Period Weeks   Status On-going     OT SHORT TERM GOAL #2   Title Fabricate/ modify or change splints as needed to prevent contractures in R hand and wrist while nerve healing occur   Baseline Webspace getting tight , IP of thumb flexor contracture - cont to assess if need to change   Time 3   Period Weeks   Status On-going     OT SHORT TERM GOAL #3   Title R wrist AROM and PROM improve with 10-15 degrees to be able carry object on palm, wipe table and reach with wrist extention to grip    Baseline RD 0, Ext 2, UD 15 flexion 46, pronation 40, Sup 60 - progressing see flowsheet   Time 4   Period Weeks   Status On-going     OT SHORT TERM GOAL #4   Title Sensation improve in R hand to identify  ojbects in palm 50% of time    Baseline Tinel in diigts and palm    Time 4   Period Weeks   Status On-going           OT Long Term Goals - 11/26/16 0934      OT LONG TERM GOAL #1   Title (P)  Upgrade HEP to increase PIP and MC flexion as nerve is healing to grasp 6 inch object to 3 inch object   Baseline (P)  see flowsheet    Time (P)  4   Period (P)  Weeks   Status (P)  On-going     OT LONG TERM GOAL #2   Title (P)  Wrist ROM improve and strength for pt to intiate using 1 lbs weight for wrist in all planes    Baseline (P)  3/4 lbs for wrist extention , 2 lbs  for RD , and sup - not pronation    Time (P)  6   Period (P)  Weeks   Status (P)  On-going     OT LONG TERM GOAL #3   Title (P)  Assess shoulder and upgrade HEP to decrease pain and compensation    Baseline (P)  HEP provided - pt report getting better    Time (P)  4   Period (P)  Weeks   Status (P)  On-going               Plan - 01/01/17 1024    Clinical Impression Statement Pt cont to make progress in ROM - grip - but scar tissue and nerve healing limiting progress-  during grip - wrist ones to collapse into flexion - thick scar - pt to cont to increase wrist exention    Occupational performance deficits (Please refer to evaluation for details): ADL's;IADL's;Work;Play;Leisure   Rehab Potential Good   OT Frequency 2x / week   OT Duration 4 weeks   OT Treatment/Interventions Self-care/ADL training;Fluidtherapy;Splinting;Patient/family education;Therapeutic exercises;Ultrasound;Scar mobilization;Passive range of motion;Neuromuscular education;Electrical Stimulation;Manual Therapy   Clinical Decision Making Multiple treatment options, significant modification of task necessary   OT Home Exercise Plan see pt instruction   Consulted and Agree with Plan of Care Patient      Patient will benefit from skilled therapeutic intervention in order to improve the following deficits and impairments:  Decreased range of motion, Impaired flexibility, Decreased coordination, Decreased safety awareness, Increased edema, Impaired sensation, Decreased skin integrity, Decreased knowledge of precautions, Decreased knowledge of use of DME, Decreased scar mobility, Impaired UE functional use, Pain, Decreased strength, Impaired perceived functional ability  Visit Diagnosis: Stiffness of right wrist, not elsewhere classified  Pain in right arm  Stiffness of right hand, not elsewhere classified  Other disturbances of skin sensation  Scar condition and fibrosis of skin  Muscle weakness (generalized)    Problem List There are no active problems to display for this patient.   Rosalyn Gess OTR/L,CLT 01/01/2017, 11:44 AM  [facility] [address] [place], [address] Phone: [phone]   Fax:  [phone]  Name: Francisco Johns MRN: [id number] Date of Birth: 03/05/1976

## 2017-01-01 NOTE — Patient Instructions (Signed)
Same HEP - focus on wrist extention

## 2017-01-03 ENCOUNTER — Ambulatory Visit: Payer: 59 | Admitting: Occupational Therapy

## 2017-01-03 DIAGNOSIS — M6281 Muscle weakness (generalized): Secondary | ICD-10-CM

## 2017-01-03 DIAGNOSIS — M79601 Pain in right arm: Secondary | ICD-10-CM

## 2017-01-03 DIAGNOSIS — R208 Other disturbances of skin sensation: Secondary | ICD-10-CM

## 2017-01-03 DIAGNOSIS — L905 Scar conditions and fibrosis of skin: Secondary | ICD-10-CM

## 2017-01-03 DIAGNOSIS — M25631 Stiffness of right wrist, not elsewhere classified: Secondary | ICD-10-CM | POA: Diagnosis not present

## 2017-01-03 DIAGNOSIS — M25641 Stiffness of right hand, not elsewhere classified: Secondary | ICD-10-CM | POA: Diagnosis not present

## 2017-01-03 NOTE — Patient Instructions (Addendum)
Same HEP - can get 4 lbs for RD< UD , sup /pro - wrap hand - support pronation 1/2 ways  12 reps  Focus on Wrist extention - neutral position

## 2017-01-03 NOTE — Therapy (Signed)
[facility] [address] [place], [address] Phone: [phone]   Fax:  [phone]  Occupational Therapy Treatment  Patient Details  Name: Francisco Johns MRN: [id number] Date of Birth: 1975/04/29 Referring Provider: Vernona Rieger  Encounter Date: 01/03/2017      OT End of Session - 01/03/17 0931    Visit Number 40   Number of Visits 55   Date for OT Re-Evaluation 02/13/17   OT Start Time 0900   OT Stop Time 1004   OT Time Calculation (min) 64 min   Activity Tolerance Patient tolerated treatment well      Past Medical History:  Diagnosis Date  . Metatarsal fracture    right big toe    Past Surgical History:  Procedure Laterality Date  . ankle surgery    . NO PAST SURGERIES    . ORIF TOE FRACTURE Right 12/09/2015   Procedure: OPEN REDUCTION INTERNAL FIXATION (ORIF) METATARSAL (TOE) FRACTURE, first metatarsal;  Surgeon: Renette Butters, MD;  Location: [place];  Service: Orthopedics;  Laterality: Right;    There were no vitals filed for this visit.      Subjective Assessment - 01/03/17 0922    Subjective  I can get that middle and index finger more straigth -  bu   Patient Stated Goals I want as much movement and use I can get for my R dominant hand- to work , play with my kids, fish , play golf ,   Currently in Pain? No/denies             Scar massage using Graston tool nr 2 , brushing - sweeping  Vibration on proximal  Scar with flexion of wrist and digits - AROM   Assess scar and where to apply kinesiotape Kinesiotape this date applied on 2 areas on graft -that are adhere - with resistance to 3 thru 5th digits, thumb PA and wrist flexion - did across 2 long at 100 % at each and 100%  across proximal and distal to scar to achor middle 2 - pt ed on precautions and extra provided for wife that is RN to replace- but take off at night - but left distal ulnar side of wrist  open - and did x kinesiotape over proximal scar adhesion - with knot  Done heat for flexion of 5th at [facility] again   BTE 701 at 2lbs - place and hold for wrist extention -120sec  x 2 - wrapping hand into fist for one of them - other one open hand Tool 504 not wrap into fist this date - and could do 120 sec -At 3 lbs   4 lbs weight for RD< UD to side , sup /pro - support 1/2 way  Hand wrapped around weight - could to 15 reps   Large knob increase to  6 lbs for RD, UD - 120 sec each in vertical position this - taped IP of thumb into extention  Gripper - 28lbs 120 sec - place and hold 3 sec - placing MC in partial flexion - not compensating as much with pulling this date - D ring for pronation 5 lbs 120 sec   Lat grip this date - 120 sec  kinesiotape IP into extention - to block from hyper flexion- 12 lbs - and pt to focus on pad - and not rotation of thumb with pinch  Estim at end see un attended - protocol at 14 .5  current - 2 x 2 cm thumb thenar eminence - and proximal forearm for Thumb PA  Buddy strap to cont with for 5th to be use when gripping or driving during day - for 30 min at time  :                       OT Education - 01/03/17 0930    Education provided Yes   Education Details Review - can up 4 lbs for UD , RD, sup/pro    Person(s) Educated Patient   Methods Explanation;Demonstration;Tactile cues;Verbal cues   Comprehension Verbal cues required;Returned demonstration;Verbalized understanding          OT Short Term Goals - 11/26/16 0933      OT SHORT TERM GOAL #1   Title Pt to be ind in HEP to use splints correctly , increase ROM and sensatin in R hand and wrist    Baseline Tinel in palm and digits , splint wearing    Time 3   Period Weeks   Status On-going     OT SHORT TERM GOAL #2   Title Fabricate/ modify or change splints as needed to prevent contractures in R hand and wrist while nerve healing occur   Baseline Webspace getting  tight , IP of thumb flexor contracture - cont to assess if need to change   Time 3   Period Weeks   Status On-going     OT SHORT TERM GOAL #3   Title R wrist AROM and PROM improve with 10-15 degrees to be able carry object on palm, wipe table and reach with wrist extention to grip    Baseline RD 0, Ext 2, UD 15 flexion 46, pronation 40, Sup 60 - progressing see flowsheet   Time 4   Period Weeks   Status On-going     OT SHORT TERM GOAL #4   Title Sensation improve in R hand to identify  ojbects in palm 50% of time    Baseline Tinel in diigts and palm    Time 4   Period Weeks   Status On-going           OT Long Term Goals - 11/26/16 0934      OT LONG TERM GOAL #1   Title (P)  Upgrade HEP to increase PIP and MC flexion as nerve is healing to grasp 6 inch object to 3 inch object   Baseline (P)  see flowsheet    Time (P)  4   Period (P)  Weeks   Status (P)  On-going     OT LONG TERM GOAL #2   Title (P)  Wrist ROM improve and strength for pt to intiate using 1 lbs weight for wrist in all planes    Baseline (P)  3/4 lbs for wrist extention , 2 lbs for RD , and sup - not pronation    Time (P)  6   Period (P)  Weeks   Status (P)  On-going     OT LONG TERM GOAL #3   Title (P)  Assess shoulder and upgrade HEP to decrease pain and compensation    Baseline (P)  HEP provided - pt report getting better    Time (P)  4   Period (P)  Weeks   Status (P)  On-going               Plan - 01/03/17 0931    Clinical Impression Statement Pt making progress- increase  PA of thumb , 2nd and 3rd digit extention -  about 6-7 wks grip on BTE at 10 lbs , and still had to wrap wrist extention around  tool nr 504 - pt to cont with HEP    Occupational performance deficits (Please refer to evaluation for details): ADL's;IADL's;Work   Rehab Potential Good   OT Frequency 2x / week   OT Duration 4 weeks   OT Treatment/Interventions Self-care/ADL training;Fluidtherapy;Splinting;Patient/family  education;Therapeutic exercises;Ultrasound;Scar mobilization;Passive range of motion;Neuromuscular education;Electrical Stimulation;Manual Therapy   Plan cont to increase resistance , scar tissue adress    Clinical Decision Making Multiple treatment options, significant modification of task necessary   OT Home Exercise Plan see pt instruction   Consulted and Agree with Plan of Care Patient      Patient will benefit from skilled therapeutic intervention in order to improve the following deficits and impairments:  Decreased range of motion, Impaired flexibility, Decreased coordination, Decreased safety awareness, Increased edema, Impaired sensation, Decreased skin integrity, Decreased knowledge of precautions, Decreased knowledge of use of DME, Decreased scar mobility, Impaired UE functional use, Pain, Decreased strength, Impaired perceived functional ability  Visit Diagnosis: Stiffness of right wrist, not elsewhere classified  Pain in right arm  Stiffness of right hand, not elsewhere classified  Other disturbances of skin sensation  Scar condition and fibrosis of skin  Muscle weakness (generalized)    Problem List There are no active problems to display for this patient.   Rosalyn Gess OTR/L,CLT 01/03/2017, 10:24 AM  [facility] [address] [place], [address] Phone: [phone]   Fax:  [phone]  Name: Francisco Johns MRN: [id number] Date of Birth: 10/19/1975

## 2017-01-08 ENCOUNTER — Ambulatory Visit: Payer: 59 | Admitting: Occupational Therapy

## 2017-01-08 DIAGNOSIS — M25641 Stiffness of right hand, not elsewhere classified: Secondary | ICD-10-CM | POA: Diagnosis not present

## 2017-01-08 DIAGNOSIS — M6281 Muscle weakness (generalized): Secondary | ICD-10-CM | POA: Diagnosis not present

## 2017-01-08 DIAGNOSIS — M25631 Stiffness of right wrist, not elsewhere classified: Secondary | ICD-10-CM

## 2017-01-08 DIAGNOSIS — L905 Scar conditions and fibrosis of skin: Secondary | ICD-10-CM

## 2017-01-08 DIAGNOSIS — R208 Other disturbances of skin sensation: Secondary | ICD-10-CM | POA: Diagnosis not present

## 2017-01-08 DIAGNOSIS — M79601 Pain in right arm: Secondary | ICD-10-CM | POA: Diagnosis not present

## 2017-01-08 NOTE — Therapy (Signed)
[facility] [address] [place], [address] Phone: [phone]   Fax:  [phone]  Occupational Therapy Treatment  Patient Details  Name: Francisco Johns MRN: [id number] Date of Birth: 04-04-1976 Referring Provider: Vernona Rieger  Encounter Date: 01/08/2017      OT End of Session - 01/08/17 1349    Visit Number 41   Number of Visits 55   Date for OT Re-Evaluation 02/13/17   OT Start Time 1101   OT Stop Time 1203   OT Time Calculation (min) 62 min   Activity Tolerance Patient tolerated treatment well   Behavior During Therapy [facility] for tasks assessed/performed      Past Medical History:  Diagnosis Date  . Metatarsal fracture    right big toe    Past Surgical History:  Procedure Laterality Date  . ankle surgery    . NO PAST SURGERIES    . ORIF TOE FRACTURE Right 12/09/2015   Procedure: OPEN REDUCTION INTERNAL FIXATION (ORIF) METATARSAL (TOE) FRACTURE, first metatarsal;  Surgeon: Renette Butters, MD;  Location: [facility];  Service: Orthopedics;  Laterality: Right;    There were no vitals filed for this visit.      Subjective Assessment - 01/08/17 1336    Subjective  About the same - trying to pick up things with my hand - Did di start 4 lbs weight for wrist , elbow and shoulder    Patient Stated Goals I want as much movement and use I can get for my R dominant hand- to work , play with my kids, fish , play golf ,   Currently in Pain? No/denies       Scar massage using Graston tool nr 2 , brushing - sweeping on proximal to scar  Tried xtrator on scar adhesion distal on graft 3 times - NO ROM  With it   Assess scar and where to apply kinesiotape Kinesiotape this date applied on 2 areas on graft -that are adhere - with resistance to 3 thru 5th digits, thumb PA and wrist flexion - did 3 at 100 % pull - star over distal scar and proximal adhesion on  graft  Pt aware of precautions    BTE 701 at 2lbs - place and hold for wrist extention -120sec  x 2 - wrapping hand into fist for one of them - other one open hand with 0 lbs  Tool 504 not wrap into fist this date - and could do 120 sec -At 3 lbs   4 lbs weight for RD, UD at side , sup /pro - support 1/2 way  Hand wrapped around weight - could to 15 reps   Large knob increase to 6 lbs for RD, UD - 120 sec each in vertical position this - taped IP of thumb into extention  Gripper - 28lbs 120 sec - place and hold 3 sec - placing MC in partial flexion - not compensating as much with pulling this date -    Lat grip this date - 120 sec  kinesiotape IP into extention - to block from hyper flexion- 12 lbs - and pt to focus on pad - and not rotation of thumb with pinch  Estim at end see un attended - protocol at 14 .5 current - 2 x 2 cm thumb thenar eminence - and proximal forearm for Thumb PA  Buddy strap to cont with for 5th to be use when gripping or driving during  day - for 30 min at time  :                         OT Education - 01/08/17 1349    Education provided Yes   Education Details putty light blue -    Person(s) Educated Patient   Methods Explanation;Demonstration;Tactile cues   Comprehension Returned demonstration;Verbalized understanding          OT Short Term Goals - 11/26/16 0933      OT SHORT TERM GOAL #1   Title Pt to be ind in HEP to use splints correctly , increase ROM and sensatin in R hand and wrist    Baseline Tinel in palm and digits , splint wearing    Time 3   Period Weeks   Status On-going     OT SHORT TERM GOAL #2   Title Fabricate/ modify or change splints as needed to prevent contractures in R hand and wrist while nerve healing occur   Baseline Webspace getting tight , IP of thumb flexor contracture - cont to assess if need to change   Time 3   Period Weeks   Status On-going     OT SHORT TERM GOAL #3   Title R wrist AROM and PROM improve with  10-15 degrees to be able carry object on palm, wipe table and reach with wrist extention to grip    Baseline RD 0, Ext 2, UD 15 flexion 46, pronation 40, Sup 60 - progressing see flowsheet   Time 4   Period Weeks   Status On-going     OT SHORT TERM GOAL #4   Title Sensation improve in R hand to identify  ojbects in palm 50% of time    Baseline Tinel in diigts and palm    Time 4   Period Weeks   Status On-going           OT Long Term Goals - 11/26/16 0934      OT LONG TERM GOAL #1   Title (P)  Upgrade HEP to increase PIP and MC flexion as nerve is healing to grasp 6 inch object to 3 inch object   Baseline (P)  see flowsheet    Time (P)  4   Period (P)  Weeks   Status (P)  On-going     OT LONG TERM GOAL #2   Title (P)  Wrist ROM improve and strength for pt to intiate using 1 lbs weight for wrist in all planes    Baseline (P)  3/4 lbs for wrist extention , 2 lbs for RD , and sup - not pronation    Time (P)  6   Period (P)  Weeks   Status (P)  On-going     OT LONG TERM GOAL #3   Title (P)  Assess shoulder and upgrade HEP to decrease pain and compensation    Baseline (P)  HEP provided - pt report getting better    Time (P)  4   Period (P)  Weeks   Status (P)  On-going               Plan - 01/08/17 1349    Clinical Impression Statement Pt cont to make progress in increase ROM , strength - butl still limited by scar tissue , nerve healing -    Occupational performance deficits (Please refer to evaluation for details): ADL's;IADL's;Work   Rehab Potential Good   OT Frequency 2x /  week   OT Duration 4 weeks   OT Treatment/Interventions Self-care/ADL training;Fluidtherapy;Splinting;Patient/family education;Therapeutic exercises;Ultrasound;Scar mobilization;Passive range of motion;Neuromuscular education;Electrical Stimulation;Manual Therapy   Clinical Decision Making Multiple treatment options, significant modification of task necessary   OT Home Exercise Plan see pt  instruction   Consulted and Agree with Plan of Care Patient      Patient will benefit from skilled therapeutic intervention in order to improve the following deficits and impairments:  Decreased range of motion, Impaired flexibility, Decreased coordination, Decreased safety awareness, Increased edema, Impaired sensation, Decreased skin integrity, Decreased knowledge of precautions, Decreased knowledge of use of DME, Decreased scar mobility, Impaired UE functional use, Pain, Decreased strength, Impaired perceived functional ability  Visit Diagnosis: Stiffness of right wrist, not elsewhere classified  Pain in right arm  Stiffness of right hand, not elsewhere classified  Other disturbances of skin sensation  Scar condition and fibrosis of skin  Muscle weakness (generalized)    Problem List There are no active problems to display for this patient.   Rosalyn Gess OTR/L,CLT 01/08/2017, 1:51 PM  [facility] [address], [place], [address] Phone: [phone]   Fax:  [phone]  Name: Francisco Johns MRN: [id number] Date of Birth: 07/08/1975

## 2017-01-08 NOTE — Patient Instructions (Signed)
Try grip and pick up objects with wrist in neutral  Wrist extention - work on midline  Putty light blue grip - 15 reps  2 x day - with wrist straight

## 2017-01-11 ENCOUNTER — Ambulatory Visit: Payer: 59 | Admitting: Occupational Therapy

## 2017-01-11 DIAGNOSIS — M25641 Stiffness of right hand, not elsewhere classified: Secondary | ICD-10-CM

## 2017-01-11 DIAGNOSIS — M6281 Muscle weakness (generalized): Secondary | ICD-10-CM

## 2017-01-11 DIAGNOSIS — M79601 Pain in right arm: Secondary | ICD-10-CM | POA: Diagnosis not present

## 2017-01-11 DIAGNOSIS — L905 Scar conditions and fibrosis of skin: Secondary | ICD-10-CM | POA: Diagnosis not present

## 2017-01-11 DIAGNOSIS — R208 Other disturbances of skin sensation: Secondary | ICD-10-CM | POA: Diagnosis not present

## 2017-01-11 DIAGNOSIS — M25631 Stiffness of right wrist, not elsewhere classified: Secondary | ICD-10-CM | POA: Diagnosis not present

## 2017-01-11 NOTE — Patient Instructions (Signed)
Same HEP  

## 2017-01-11 NOTE — Therapy (Signed)
[facility] [address], [place], [address] Phone: [phone]   Fax:  [phone]  Occupational Therapy Treatment  Patient Details  Name: DAYVEN LINSLEY MRN: [id number] Date of Birth: Nov 03, 1975 Referring Provider: Vernona Rieger  Encounter Date: 01/11/2017      OT End of Session - 01/11/17 1236    Visit Number 42   Number of Visits 55   Date for OT Re-Evaluation 02/13/17   OT Start Time 0916   OT Stop Time 1025   OT Time Calculation (min) 69 min   Activity Tolerance Patient tolerated treatment well   Behavior During Therapy [facility] for tasks assessed/performed      Past Medical History:  Diagnosis Date  . Metatarsal fracture    right big toe    Past Surgical History:  Procedure Laterality Date  . ankle surgery    . NO PAST SURGERIES    . ORIF TOE FRACTURE Right 12/09/2015   Procedure: OPEN REDUCTION INTERNAL FIXATION (ORIF) METATARSAL (TOE) FRACTURE, first metatarsal;  Surgeon: Renette Butters, MD;  Location: [facility];  Service: Orthopedics;  Laterality: Right;    There were no vitals filed for this visit.      Subjective Assessment - 01/11/17 1235    Subjective  Doing okay - I pulled my back Wed - feels better today - I am doing 4 lbs at home    Patient Stated Goals I want as much movement and use I can get for my R dominant hand- to work , play with my kids, fish , play golf ,   Currently in Pain? No/denies        Scar massage using  vibration proximal to graft - on harder fibrotic area - and scar adhesion - with thumb PA and RA  xtrator on scar adhesion proximal 5 x  And  distal on graft 3 times -ROM gripping and thumb PA done this date - no irritation    Assess scar and where to apply kinesiotape Kinesiotape this date applied on 2 areas on graft -that are adhere - with resistance to 3 thru 5th digits, thumb PA and wrist flexion - did 3 at 100 % pull - star over distal scar  and proximal adhesion on  graft  Pt aware of precautions   BTE 701 at 2lbs - place and hold for wrist extention -120sec  x 2 - wrapping hand into fist for one of them - other one open hand with 0 lbs  Tool 504 not wrap into fist this date - and could do 120 sec -At 3 lbs  Done 504 this date doing sup /pro -   4 lbs weight for RD,UD at side , sup /pro - support 1/2 way Hand wrapped around weight - could to 15 reps  Able to maintain wrist extention neutral for 4 lbs - 15 reps   Large knob increase to 6 lbs for RD, UD - 120 sec each in vertical position this - taped IP of thumb into extention  Gripper - 30lbs 120 sec - place and hold 3 sec - placing MC in partial flexion - not compensating as much with pulling this date -    Lat grip this date - 120 sec  - increase to 20 lbs  kinesiotape IP into extention - to block from hyper flexion- 12 lbs - and pt to focus on pad - and not rotation of thumb with pinch  Estim at  end see un attended - protocol at 14 .5 current - 2 x 2 cm thumb thenar eminence - and proximal forearm for Thumb PA  Buddy strap to cont with for 5th to be use when gripping or driving during day - for 30 min at time  :                        OT Education - 01/11/17 1236    Education provided Yes   Education Details HEP    Person(s) Educated Patient   Methods Explanation;Demonstration;Tactile cues;Verbal cues   Comprehension Returned demonstration;Verbalized understanding          OT Short Term Goals - 11/26/16 0933      OT SHORT TERM GOAL #1   Title Pt to be ind in HEP to use splints correctly , increase ROM and sensatin in R hand and wrist    Baseline Tinel in palm and digits , splint wearing    Time 3   Period Weeks   Status On-going     OT SHORT TERM GOAL #2   Title Fabricate/ modify or change splints as needed to prevent contractures in R hand and wrist while nerve healing occur   Baseline Webspace getting tight , IP of  thumb flexor contracture - cont to assess if need to change   Time 3   Period Weeks   Status On-going     OT SHORT TERM GOAL #3   Title R wrist AROM and PROM improve with 10-15 degrees to be able carry object on palm, wipe table and reach with wrist extention to grip    Baseline RD 0, Ext 2, UD 15 flexion 46, pronation 40, Sup 60 - progressing see flowsheet   Time 4   Period Weeks   Status On-going     OT SHORT TERM GOAL #4   Title Sensation improve in R hand to identify  ojbects in palm 50% of time    Baseline Tinel in diigts and palm    Time 4   Period Weeks   Status On-going           OT Long Term Goals - 11/26/16 0934      OT LONG TERM GOAL #1   Title (P)  Upgrade HEP to increase PIP and MC flexion as nerve is healing to grasp 6 inch object to 3 inch object   Baseline (P)  see flowsheet    Time (P)  4   Period (P)  Weeks   Status (P)  On-going     OT LONG TERM GOAL #2   Title (P)  Wrist ROM improve and strength for pt to intiate using 1 lbs weight for wrist in all planes    Baseline (P)  3/4 lbs for wrist extention , 2 lbs for RD , and sup - not pronation    Time (P)  6   Period (P)  Weeks   Status (P)  On-going     OT LONG TERM GOAL #3   Title (P)  Assess shoulder and upgrade HEP to decrease pain and compensation    Baseline (P)  HEP provided - pt report getting better    Time (P)  4   Period (P)  Weeks   Status (P)  On-going               Plan - 01/11/17 1237    Clinical Impression Statement Pt able this date to do  wrist extention  place and hold 5 lbs in neutral against gravity - but acewrap grip  and able to do tool nr 504 - screwdriver in sup and pronation - could not do it 1-2 wks ago - cont to show increase ROM , strength   Occupational performance deficits (Please refer to evaluation for details): ADL's;IADL's;Work   Rehab Potential Good   OT Frequency 2x / week   OT Duration 4 weeks   OT Treatment/Interventions Self-care/ADL  training;Fluidtherapy;Splinting;Patient/family education;Therapeutic exercises;Ultrasound;Scar mobilization;Passive range of motion;Neuromuscular education;Electrical Stimulation;Manual Therapy   Plan  cont upgrade HEP , scar tissue adress    Clinical Decision Making Multiple treatment options, significant modification of task necessary   OT Home Exercise Plan see pt instruction   Consulted and Agree with Plan of Care Patient      Patient will benefit from skilled therapeutic intervention in order to improve the following deficits and impairments:  Decreased range of motion, Impaired flexibility, Decreased coordination, Decreased safety awareness, Increased edema, Impaired sensation, Decreased skin integrity, Decreased knowledge of precautions, Decreased knowledge of use of DME, Decreased scar mobility, Impaired UE functional use, Pain, Decreased strength, Impaired perceived functional ability  Visit Diagnosis: Stiffness of right wrist, not elsewhere classified  Pain in right arm  Stiffness of right hand, not elsewhere classified  Other disturbances of skin sensation  Scar condition and fibrosis of skin  Muscle weakness (generalized)    Problem List There are no active problems to display for this patient.   Rosalyn Gess 01/11/2017, 12:41 PM  [facility] [address] [place], [address] Phone: [phone]   Fax:  [phone]  Name: ABISHAI VIEGAS MRN: [id number] Date of Birth: [birth year]

## 2017-01-16 ENCOUNTER — Ambulatory Visit: Payer: 59 | Admitting: Occupational Therapy

## 2017-01-16 DIAGNOSIS — R208 Other disturbances of skin sensation: Secondary | ICD-10-CM

## 2017-01-16 DIAGNOSIS — L905 Scar conditions and fibrosis of skin: Secondary | ICD-10-CM | POA: Diagnosis not present

## 2017-01-16 DIAGNOSIS — M25631 Stiffness of right wrist, not elsewhere classified: Secondary | ICD-10-CM

## 2017-01-16 DIAGNOSIS — M79601 Pain in right arm: Secondary | ICD-10-CM | POA: Diagnosis not present

## 2017-01-16 DIAGNOSIS — M6281 Muscle weakness (generalized): Secondary | ICD-10-CM | POA: Diagnosis not present

## 2017-01-16 DIAGNOSIS — M25641 Stiffness of right hand, not elsewhere classified: Secondary | ICD-10-CM

## 2017-01-16 NOTE — Therapy (Signed)
[facility] [address] [place], [address] Phone: [phone]   Fax:  [phone]  Occupational Therapy Treatment  Patient Details  Name: Francisco Johns MRN: [id number] Date of Birth: 09-10-1975 Referring Provider: Vernona Rieger  Encounter Date: 01/16/2017      OT End of Session - 01/16/17 1454    Visit Number 43   Number of Visits 55   Date for OT Re-Evaluation 02/13/17   OT Start Time 1407   OT Stop Time 1525   OT Time Calculation (min) 78 min   Activity Tolerance Patient tolerated treatment well   Behavior During Therapy [facility] for tasks assessed/performed      Past Medical History:  Diagnosis Date  . Metatarsal fracture    right big toe    Past Surgical History:  Procedure Laterality Date  . ankle surgery    . NO PAST SURGERIES    . ORIF TOE FRACTURE Right 12/09/2015   Procedure: OPEN REDUCTION INTERNAL FIXATION (ORIF) METATARSAL (TOE) FRACTURE, first metatarsal;  Surgeon: Renette Butters, MD;  Location: [facility];  Service: Orthopedics;  Laterality: Right;    There were no vitals filed for this visit.      Subjective Assessment - 01/16/17 1447    Subjective  Trying to use it more but slow - pins and needles the same -  I wish my thumb and pinkie can kick in    Patient Stated Goals I want as much movement and use I can get for my R dominant hand- to work , play with my kids, fish , play golf ,   Currently in Pain? No/denies            [facility] OT Assessment - 01/16/17 0001      Strength   Right Hand Grip (lbs) 14  supported    Right Hand Lateral Pinch 4 lbs      Scar massage using  vibration proximal to graft - on harder fibrotic area - and scar adhesion - with thumb PA and RA  xtrator on scar adhesion proximal 5 x  And  distal on graft 4 times -ROM gripping and thumb PA done this date - no irritation with extractor    Assess scar and where to apply  kinesiotape Kinesiotape this date applied on 2 areas on graft -that are adhere - with resistance to 3 thru 5th digits, thumb PA and wrist flexion - did 3 at 100 % pull - star over distal scar and proximal adhesion on graft  Pt aware of precautions   BTE 701 at 4 lbs - place and hold for wrist extention -120sec  x 2 - wrapping hand into fist for one of them - other one open hand with 3 lbs  Tool 504 not wrap into fist this date - and could do 120 sec -At 3 lbs    5 lbs weight for RD,UD atside , sup /pro - support 1/2 way Hand wrapped around weight - could to 15 reps    Large knob increase to 6 lbs for RD, UD - 120 sec each in vertical position this - taped IP of thumb into extention  Gripper - 30lbs 120 sec - place and hold 3 sec - placing MC in partial flexion - not compensating as much with pulling this date -   Lat grip this date - 120 sec  - increase to 20 lbs  kinesiotape IP into extention - to block  from hyper flexion- 12 lbs - and pt to focus on pad - and not rotation of thumb with pinch  Estim at end see un attended - protocol at 14 .5 current - 2 x 2 cm thumb thenar eminence - and proximal forearm for Thumb PA  Buddy strap to cont with for 5th to be use when gripping or driving during day - for 30 min at time  :                      OT Education - 01/16/17 1453    Education provided Yes   Education Details upgrade to green band for shoulder and scapula   Person(s) Educated Patient   Methods Explanation;Demonstration;Tactile cues   Comprehension Verbalized understanding;Returned demonstration          OT Short Term Goals - 11/26/16 0933      OT SHORT TERM GOAL #1   Title Pt to be ind in HEP to use splints correctly , increase ROM and sensatin in R hand and wrist    Baseline Tinel in palm and digits , splint wearing    Time 3   Period Weeks   Status On-going     OT SHORT TERM GOAL #2   Title Fabricate/ modify or change splints as  needed to prevent contractures in R hand and wrist while nerve healing occur   Baseline Webspace getting tight , IP of thumb flexor contracture - cont to assess if need to change   Time 3   Period Weeks   Status On-going     OT SHORT TERM GOAL #3   Title R wrist AROM and PROM improve with 10-15 degrees to be able carry object on palm, wipe table and reach with wrist extention to grip    Baseline RD 0, Ext 2, UD 15 flexion 46, pronation 40, Sup 60 - progressing see flowsheet   Time 4   Period Weeks   Status On-going     OT SHORT TERM GOAL #4   Title Sensation improve in R hand to identify  ojbects in palm 50% of time    Baseline Tinel in diigts and palm    Time 4   Period Weeks   Status On-going           OT Long Term Goals - 11/26/16 0934      OT LONG TERM GOAL #1   Title (P)  Upgrade HEP to increase PIP and MC flexion as nerve is healing to grasp 6 inch object to 3 inch object   Baseline (P)  see flowsheet    Time (P)  4   Period (P)  Weeks   Status (P)  On-going     OT LONG TERM GOAL #2   Title (P)  Wrist ROM improve and strength for pt to intiate using 1 lbs weight for wrist in all planes    Baseline (P)  3/4 lbs for wrist extention , 2 lbs for RD , and sup - not pronation    Time (P)  6   Period (P)  Weeks   Status (P)  On-going     OT LONG TERM GOAL #3   Title (P)  Assess shoulder and upgrade HEP to decrease pain and compensation    Baseline (P)  HEP provided - pt report getting better    Time (P)  4   Period (P)  Weeks   Status (P)  On-going  Plan - 01/16/17 1455    Clinical Impression Statement Pt cont to make slow but steady progress in strength , ROM - show better control for wrist neutral position during gripping theraband - still collapse into flexion during gripper - was able to increase most all tools on BTE 1 lbs    Occupational performance deficits (Please refer to evaluation for details): ADL's;IADL's;Work   Rehab Potential Good    OT Frequency 2x / week   OT Duration 4 weeks   OT Treatment/Interventions Self-care/ADL training;Fluidtherapy;Splinting;Patient/family education;Therapeutic exercises;Ultrasound;Scar mobilization;Passive range of motion;Neuromuscular education;Electrical Stimulation;Manual Therapy   Clinical Decision Making Multiple treatment options, significant modification of task necessary   OT Home Exercise Plan see pt instruction   Consulted and Agree with Plan of Care Patient      Patient will benefit from skilled therapeutic intervention in order to improve the following deficits and impairments:  Decreased range of motion, Impaired flexibility, Decreased coordination, Decreased safety awareness, Increased edema, Impaired sensation, Decreased skin integrity, Decreased knowledge of precautions, Decreased knowledge of use of DME, Decreased scar mobility, Impaired UE functional use, Pain, Decreased strength, Impaired perceived functional ability  Visit Diagnosis: Stiffness of right wrist, not elsewhere classified  Pain in right arm  Stiffness of right hand, not elsewhere classified  Other disturbances of skin sensation  Scar condition and fibrosis of skin  Muscle weakness (generalized)    Problem List There are no active problems to display for this patient.   Rosalyn Gess OTR/L,CLT 01/16/2017, 3:13 PM  [facility] [address] [place], [address] Phone: [phone]   Fax:  [phone]  Name: CRAWFORD TAMURA MRN: [id number] Date of Birth: May 28, 1975

## 2017-01-16 NOTE — Patient Instructions (Signed)
Add GTB for scapula retraction , shoulder extention , ext rotation  Elbow flexion and extention  10 - 12 reps

## 2017-01-18 ENCOUNTER — Ambulatory Visit: Payer: 59 | Admitting: Occupational Therapy

## 2017-01-18 DIAGNOSIS — M6281 Muscle weakness (generalized): Secondary | ICD-10-CM | POA: Diagnosis not present

## 2017-01-18 DIAGNOSIS — M79601 Pain in right arm: Secondary | ICD-10-CM

## 2017-01-18 DIAGNOSIS — M25641 Stiffness of right hand, not elsewhere classified: Secondary | ICD-10-CM | POA: Diagnosis not present

## 2017-01-18 DIAGNOSIS — R208 Other disturbances of skin sensation: Secondary | ICD-10-CM | POA: Diagnosis not present

## 2017-01-18 DIAGNOSIS — M25631 Stiffness of right wrist, not elsewhere classified: Secondary | ICD-10-CM | POA: Diagnosis not present

## 2017-01-18 DIAGNOSIS — L905 Scar conditions and fibrosis of skin: Secondary | ICD-10-CM

## 2017-01-18 NOTE — Patient Instructions (Signed)
Same HEP - but can get 5 lbs weight for next week

## 2017-01-18 NOTE — Therapy (Signed)
[facility] [address] [place], [address] Phone: [phone]   Fax:  [phone]  Occupational Therapy Treatment  Patient Details  Name: Francisco Johns MRN: [id number] Date of Birth: 03-21-1976 Referring Provider: Vernona Rieger  Encounter Date: 01/18/2017      OT End of Session - 01/18/17 1421    Visit Number 44   Number of Visits 55   Date for OT Re-Evaluation 02/13/17   OT Start Time 1202   OT Stop Time 1304   OT Time Calculation (min) 62 min   Activity Tolerance Patient tolerated treatment well   Behavior During Therapy [facility] for tasks assessed/performed      Past Medical History:  Diagnosis Date  . Metatarsal fracture    right big toe    Past Surgical History:  Procedure Laterality Date  . ankle surgery    . NO PAST SURGERIES    . ORIF TOE FRACTURE Right 12/09/2015   Procedure: OPEN REDUCTION INTERNAL FIXATION (ORIF) METATARSAL (TOE) FRACTURE, first metatarsal;  Surgeon: Renette Butters, MD;  Location: [facility];  Service: Orthopedics;  Laterality: Right;    There were no vitals filed for this visit.      Subjective Assessment - 01/18/17 1420    Subjective  Wish that pinkie want to start working - I feels like the scar is getting slowly getting better - where it is stuck    Patient Stated Goals I want as much movement and use I can get for my R dominant hand- to work , play with my kids, fish , play golf ,   Currently in Pain? No/denies      Scar massage using vibration proximal to graft - on harder fibrotic area - and scar adhesion - with thumb PA and RA  xtrator on scar adhesion proximal 10 x and with flexion of digits And distal on graft 6  times -ROM gripping and thumb PA done this date - no irritation with extractor   Assess scar and where to apply kinesiotape Kinesiotape this date applied on 2 areas on graft -that are adhere - with resistance to 3 thru 5th  digits, thumb PA and wrist flexion - did 3 at 100 % pull - star over distal scar and proximal adhesion on graft  Pt aware of precautions   BTE 701 at 4 lbs - place and hold for wrist extention -120sec  x 2 - wrapping hand into fist for one of them - other one open hand with 3 lbs  Tool 504 not wrap into fist this date - and could do 120 sec -At 3 lbs    5 lbs weight for RD,UD atside , sup /pro - support 1/2 way Hand wrapped around weight - could to 15 reps    Large knob increase to 6 lbs for RD, UD - 120 sec each in vertical position this - taped IP of thumb into extention  Gripper - 30lbs 120 sec - place and hold 3 sec - placing MC in partial flexion - not compensating as much with pulling this date - Sup and pronation doing screwdriver tool - using Benik splint  120 sec each at 1 lbs    Lat grip this date - 120 sec - increase to 20 lbs  kinesiotape IP into extention - to block from hyper flexion- 12 lbs - and pt to focus on pad - and not rotation of thumb with pinch  Estim at  end see un attended - protocol at 14 .5 current - 2 x 2 cm thumb thenar eminence - and proximal forearm for Thumb PA  Buddy strap to cont with for 5th to be use when gripping or driving during day - for 30 min at time  :                         OT Education - 01/18/17 1421    Education provided Yes   Education Details Taping of scar    Person(s) Educated Patient   Methods Explanation;Demonstration;Tactile cues;Verbal cues   Comprehension Verbal cues required;Returned demonstration;Verbalized understanding          OT Short Term Goals - 11/26/16 0933      OT SHORT TERM GOAL #1   Title Pt to be ind in HEP to use splints correctly , increase ROM and sensatin in R hand and wrist    Baseline Tinel in palm and digits , splint wearing    Time 3   Period Weeks   Status On-going     OT SHORT TERM GOAL #2   Title Fabricate/ modify or change splints as needed to  prevent contractures in R hand and wrist while nerve healing occur   Baseline Webspace getting tight , IP of thumb flexor contracture - cont to assess if need to change   Time 3   Period Weeks   Status On-going     OT SHORT TERM GOAL #3   Title R wrist AROM and PROM improve with 10-15 degrees to be able carry object on palm, wipe table and reach with wrist extention to grip    Baseline RD 0, Ext 2, UD 15 flexion 46, pronation 40, Sup 60 - progressing see flowsheet   Time 4   Period Weeks   Status On-going     OT SHORT TERM GOAL #4   Title Sensation improve in R hand to identify  ojbects in palm 50% of time    Baseline Tinel in diigts and palm    Time 4   Period Weeks   Status On-going           OT Long Term Goals - 11/26/16 0934      OT LONG TERM GOAL #1   Title (P)  Upgrade HEP to increase PIP and MC flexion as nerve is healing to grasp 6 inch object to 3 inch object   Baseline (P)  see flowsheet    Time (P)  4   Period (P)  Weeks   Status (P)  On-going     OT LONG TERM GOAL #2   Title (P)  Wrist ROM improve and strength for pt to intiate using 1 lbs weight for wrist in all planes    Baseline (P)  3/4 lbs for wrist extention , 2 lbs for RD , and sup - not pronation    Time (P)  6   Period (P)  Weeks   Status (P)  On-going     OT LONG TERM GOAL #3   Title (P)  Assess shoulder and upgrade HEP to decrease pain and compensation    Baseline (P)  HEP provided - pt report getting better    Time (P)  4   Period (P)  Weeks   Status (P)  On-going               Plan - 01/18/17 1422    Clinical Impression Statement Pt  able to tolerate scar mobs using xtractor on graft - but gentle and with AROM of digits flexion - scar adhesions improving slowly - show increase wrist stability with flexion of digits - greeting OT    Occupational performance deficits (Please refer to evaluation for details): ADL's;IADL's;Work;Play;Leisure   Rehab Potential Good   OT Frequency 2x /  week   OT Duration 4 weeks   OT Treatment/Interventions Self-care/ADL training;Fluidtherapy;Splinting;Patient/family education;Therapeutic exercises;Ultrasound;Scar mobilization;Passive range of motion;Neuromuscular education;Electrical Stimulation;Manual Therapy   Plan cont to upgrade HEP as needed , scar management    Clinical Decision Making Multiple treatment options, significant modification of task necessary   OT Home Exercise Plan see pt instruction   Consulted and Agree with Plan of Care Patient      Patient will benefit from skilled therapeutic intervention in order to improve the following deficits and impairments:  Decreased range of motion, Impaired flexibility, Decreased coordination, Decreased safety awareness, Increased edema, Impaired sensation, Decreased skin integrity, Decreased knowledge of precautions, Decreased knowledge of use of DME, Decreased scar mobility, Impaired UE functional use, Pain, Decreased strength, Impaired perceived functional ability  Visit Diagnosis: Stiffness of right wrist, not elsewhere classified  Pain in right arm  Stiffness of right hand, not elsewhere classified  Other disturbances of skin sensation  Scar condition and fibrosis of skin  Muscle weakness (generalized)    Problem List There are no active problems to display for this patient.   Rosalyn Gess OTR/L,CLT 01/18/2017, 2:24 PM  [facility] [address] [place], [address] Phone: [phone]   Fax:  [phone]  Name: Francisco Johns MRN: [id number] Date of Birth: Jul 02, 1975

## 2017-01-22 ENCOUNTER — Ambulatory Visit: Payer: 59 | Attending: Plastic Surgery | Admitting: Occupational Therapy

## 2017-01-22 DIAGNOSIS — M6281 Muscle weakness (generalized): Secondary | ICD-10-CM | POA: Insufficient documentation

## 2017-01-22 DIAGNOSIS — M25631 Stiffness of right wrist, not elsewhere classified: Secondary | ICD-10-CM | POA: Insufficient documentation

## 2017-01-22 DIAGNOSIS — M79601 Pain in right arm: Secondary | ICD-10-CM | POA: Diagnosis not present

## 2017-01-22 DIAGNOSIS — R208 Other disturbances of skin sensation: Secondary | ICD-10-CM | POA: Diagnosis not present

## 2017-01-22 DIAGNOSIS — M25641 Stiffness of right hand, not elsewhere classified: Secondary | ICD-10-CM | POA: Diagnosis not present

## 2017-01-22 DIAGNOSIS — L905 Scar conditions and fibrosis of skin: Secondary | ICD-10-CM | POA: Diagnosis not present

## 2017-01-22 NOTE — Patient Instructions (Signed)
Can do 5 lbs weight  For sup/pro, RD, UD  4 lbs for wrist extention place and hold

## 2017-01-22 NOTE — Therapy (Signed)
[facility] [address] [place], [address] Phone: [phone]   Fax:  [phone]  Occupational Therapy Treatment  Patient Details  Name: Francisco Johns MRN: [id number] Date of Birth: 1975-06-10 Referring Provider: Vernona Rieger  Encounter Date: 01/22/2017      OT End of Session - 01/22/17 1506    Visit Number 45   Number of Visits 55   Date for OT Re-Evaluation 02/13/17   OT Start Time 1255   OT Stop Time 1406   OT Time Calculation (min) 71 min   Activity Tolerance Patient tolerated treatment well   Behavior During Therapy [facility] for tasks assessed/performed      Past Medical History:  Diagnosis Date  . Metatarsal fracture    right big toe    Past Surgical History:  Procedure Laterality Date  . ankle surgery    . NO PAST SURGERIES    . ORIF TOE FRACTURE Right 12/09/2015   Procedure: OPEN REDUCTION INTERNAL FIXATION (ORIF) METATARSAL (TOE) FRACTURE, first metatarsal;  Surgeon: Renette Butters, MD;  Location: [facility];  Service: Orthopedics;  Laterality: Right;    There were no vitals filed for this visit.      Subjective Assessment - 01/22/17 1429    Subjective  I bought a 5 lbs weight - can I do it at home - trying to use it - feels like the graft is geting better    Patient Stated Goals I want as much movement and use I can get for my R dominant hand- to work , play with my kids, fish , play golf ,   Currently in Pain? No/denies        Scar massage using vibration proximal to graft - on harder fibrotic area - and scar adhesion - with thumb PA and RA  xtrator on scar adhesion proximal 10 x and with flexion of digits And distal on graft 6 times -ROM gripping and thumb PA done this date - no irritation with extractor  Assess scar and where to apply kinesiotape Kinesiotape this date applied on 2 areas on graft -that are adhere - with resistance to 3 thru 5th digits, thumb  PA and wrist flexion - did 2 at 100 % pull opening graft distally  - and one across over adhesion - and then  star over proximal  scar graft  Pt aware of precautions   BTE 701 at 7 lbs - place and hold for wrist extention -120sec  x 2 - wrapping hand into fist for one of them - other one open hand with 5lbs  Tool 504 not wrap into fist this date - and could do 120 sec -At 3lbs    5lbs weight for RD,UD atside , sup /pro - support 1/2 way Hand wrapped around weight - could to 15 reps   Large knob increase to 6 lbs for RD, UD - 120 sec each in horizontal position this - taped IP of thumb into extention  Gripper - 30lbs 120 sec - place and hold 3 sec - placing MC in partial flexion - not compensating as much with pulling this date - Sup and pronation simulating key turning - Benik splint and coban thumb for stability   120 sec each at 1 lbs    Lat grip this date - 120 sec - increase to 20 lbs  kinesiotape IP into extention - to block from hyper flexion- 12 lbs - and pt to  focus on pad - and not rotation of thumb with pinch  Estim at end see un attended - protocol at 14 .5 current - 2 x 2 cm thumb thenar eminence - and proximal forearm for Thumb RA  :                         OT Education - 01/22/17 1506    Education provided Yes   Education Details 5 lbs for wrist , taping scar    Person(s) Educated Patient   Methods Explanation;Demonstration;Tactile cues;Verbal cues   Comprehension Verbal cues required;Returned demonstration;Verbalized understanding          OT Short Term Goals - 11/26/16 0933      OT SHORT TERM GOAL #1   Title Pt to be ind in HEP to use splints correctly , increase ROM and sensatin in R hand and wrist    Baseline Tinel in palm and digits , splint wearing    Time 3   Period Weeks   Status On-going     OT SHORT TERM GOAL #2   Title Fabricate/ modify or change splints as needed to prevent contractures in R hand and  wrist while nerve healing occur   Baseline Webspace getting tight , IP of thumb flexor contracture - cont to assess if need to change   Time 3   Period Weeks   Status On-going     OT SHORT TERM GOAL #3   Title R wrist AROM and PROM improve with 10-15 degrees to be able carry object on palm, wipe table and reach with wrist extention to grip    Baseline RD 0, Ext 2, UD 15 flexion 46, pronation 40, Sup 60 - progressing see flowsheet   Time 4   Period Weeks   Status On-going     OT SHORT TERM GOAL #4   Title Sensation improve in R hand to identify  ojbects in palm 50% of time    Baseline Tinel in diigts and palm    Time 4   Period Weeks   Status On-going           OT Long Term Goals - 11/26/16 0934      OT LONG TERM GOAL #1   Title (P)  Upgrade HEP to increase PIP and MC flexion as nerve is healing to grasp 6 inch object to 3 inch object   Baseline (P)  see flowsheet    Time (P)  4   Period (P)  Weeks   Status (P)  On-going     OT LONG TERM GOAL #2   Title (P)  Wrist ROM improve and strength for pt to intiate using 1 lbs weight for wrist in all planes    Baseline (P)  3/4 lbs for wrist extention , 2 lbs for RD , and sup - not pronation    Time (P)  6   Period (P)  Weeks   Status (P)  On-going     OT LONG TERM GOAL #3   Title (P)  Assess shoulder and upgrade HEP to decrease pain and compensation    Baseline (P)  HEP provided - pt report getting better    Time (P)  4   Period (P)  Weeks   Status (P)  On-going               Plan - 01/22/17 1507    Clinical Impression Statement Pt strength and scar slow but  steady progress - still limited by nerve healing and scar adhesions    Occupational performance deficits (Please refer to evaluation for details): ADL's;IADL's;Work;Play;Leisure   Rehab Potential Good   OT Frequency 2x / week   OT Duration 4 weeks   OT Treatment/Interventions Self-care/ADL training;Fluidtherapy;Splinting;Patient/family education;Therapeutic  exercises;Ultrasound;Scar mobilization;Passive range of motion;Neuromuscular education;Electrical Stimulation;Manual Therapy   Plan cont to decrease scar tissue, increase ROM and strength    Clinical Decision Making Multiple treatment options, significant modification of task necessary   OT Home Exercise Plan see pt instruction   Consulted and Agree with Plan of Care Patient      Patient will benefit from skilled therapeutic intervention in order to improve the following deficits and impairments:  Decreased range of motion, Impaired flexibility, Decreased coordination, Decreased safety awareness, Increased edema, Impaired sensation, Decreased skin integrity, Decreased knowledge of precautions, Decreased knowledge of use of DME, Decreased scar mobility, Impaired UE functional use, Pain, Decreased strength, Impaired perceived functional ability  Visit Diagnosis: Stiffness of right wrist, not elsewhere classified  Pain in right arm  Stiffness of right hand, not elsewhere classified  Other disturbances of skin sensation  Scar condition and fibrosis of skin  Muscle weakness (generalized)    Problem List There are no active problems to display for this patient.   Rosalyn Gess OTR/L,CLT 01/22/2017, 3:14 PM  [facility] [address], [place], [address] Phone: [phone]   Fax:  [phone]  Name: Francisco Johns MRN: [id number] Date of Birth: [birth year]

## 2017-01-25 ENCOUNTER — Ambulatory Visit: Payer: 59 | Admitting: Occupational Therapy

## 2017-01-25 DIAGNOSIS — M25641 Stiffness of right hand, not elsewhere classified: Secondary | ICD-10-CM | POA: Diagnosis not present

## 2017-01-25 DIAGNOSIS — R208 Other disturbances of skin sensation: Secondary | ICD-10-CM

## 2017-01-25 DIAGNOSIS — M25631 Stiffness of right wrist, not elsewhere classified: Secondary | ICD-10-CM

## 2017-01-25 DIAGNOSIS — M79601 Pain in right arm: Secondary | ICD-10-CM | POA: Diagnosis not present

## 2017-01-25 DIAGNOSIS — M6281 Muscle weakness (generalized): Secondary | ICD-10-CM

## 2017-01-25 DIAGNOSIS — L905 Scar conditions and fibrosis of skin: Secondary | ICD-10-CM | POA: Diagnosis not present

## 2017-01-25 NOTE — Patient Instructions (Addendum)
Pt to work at home on  5lbs weight for RD,UD atside , sup /pro - support 1/2 way Hand wrapped around weight - could to 20 reps 3 lbs wrap and done elbow flexion with palm down 10 reps and  Wrist extention place and hold 10 reps X 3 and can also do place and hold 4 lbs for wrist extention

## 2017-01-25 NOTE — Therapy (Signed)
[name] [address] [place], [address] Phone: [phone]   Fax:  [phone]  Occupational Therapy Treatment  Patient Details  Name: Francisco Johns MRN: [id number] Date of Birth: 1976/03/28 Referring Provider: Vernona Rieger  Encounter Date: 01/25/2017      OT End of Session - 01/25/17 1045    Visit Number 46   Number of Visits 55   Date for OT Re-Evaluation 02/13/17   OT Start Time 0917   OT Stop Time 1022   OT Time Calculation (min) 65 min   Activity Tolerance Patient tolerated treatment well   Behavior During Therapy [facility] for tasks assessed/performed      Past Medical History:  Diagnosis Date  . Metatarsal fracture    right big toe    Past Surgical History:  Procedure Laterality Date  . ankle surgery    . NO PAST SURGERIES    . ORIF TOE FRACTURE Right 12/09/2015   Procedure: OPEN REDUCTION INTERNAL FIXATION (ORIF) METATARSAL (TOE) FRACTURE, first metatarsal;  Surgeon: Renette Butters, MD;  Location: [facility];  Service: Orthopedics;  Laterality: Right;    There were no vitals filed for this visit.      Subjective Assessment - 01/25/17 1002    Subjective  I notice that I can pick up smaller objects and I can bath my R side with my L hand - the sensation on top of my hand is getting better - feel close to my forearm    Patient Stated Goals I want as much movement and use I can get for my R dominant hand- to work , play with my kids, fish , play golf ,   Currently in Pain? No/denies        Scar massage using vibration proximal to graft - on harder fibrotic area - and scar adhesion - with thumb PA and RA  xtrator on scar adhesion proximal 10 x and with flexion of digits And distal on graft 6 times -ROM gripping and thumb PA done this date - no irritation with extractor  Assess scar and where to apply kinesiotape Kinesiotape this date applied on 2 areas on graft -that  are adhere - with resistance to 3 thru 5th digits, thumb PA and wrist flexion - did 2 at 100 % pull - but 2 cm wide strips this date - star over distal scar and proximal adhesion on graft  Pt aware of precautions   BTE 701 at 4 lbs - place and hold for wrist extention -120sec  x 2 - wrapping hand into fist for one of them - other one open hand with 3lbs  Tool 504 not wrap into fist this date - and could do 120 sec -At 3lbs    5lbs weight for RD,UD atside , sup /pro - support 1/2 way Hand wrapped around weight - could to 20 reps 3 lbs wrap and done elbow flexion with palm down 10 reps and  Wrist extention place and hold 10 reps  Large knob on BTE 0 lbs - for partial sup and pronation - 120 sec each - collapse into palm - unable to extend digits and resist to turn with fingers - use more palm   3 kg ball UD and RD in standing and supine - 20 reps each  2 kg ball on wall for elbow , wrist extention - with shoulder  90 degrees - 3 x 45 sec    Large  knob increase to 6 lbs for RD, UD - 120 sec each in vertical position this - taped IP of thumb into extention  Gripper - 30lbs 120 sec - place and hold 3 sec - placing MC in partial flexion - not compensating as much with pulling this date - Sup and pronation doing screwdriver tool - using Benik splint  120 sec each at 1 lbs    Lat grip this date - 120 sec - increase to 20 lbs  kinesiotape IP into extention - to block from hyper flexion- 12 lbs - and pt to focus on pad - and not rotation of thumb with pinch  Estim at end see un attended - protocol at 14 .5 current - 2 x 2 cm thumb thenar eminence - and proximal forearm for Thumb PA  Buddy strap to cont with for 5th to be use when gripping or driving during day - for 30 min at time  :                         OT Education - 01/25/17 1045    Education provided Yes   Education Details wrist and elbow 3-5 lbs weights   Person(s) Educated Patient    Methods Demonstration;Tactile cues;Verbal cues   Comprehension Returned demonstration;Verbalized understanding;Verbal cues required          OT Short Term Goals - 01/25/17 1047      OT SHORT TERM GOAL #1   Title Pt to be ind in HEP to use splints correctly , increase ROM and sensatin in R hand and wrist    Status Achieved     OT SHORT TERM GOAL #2   Title Fabricate/ modify or change splints as needed to prevent contractures in R hand and wrist while nerve healing occur   Status Achieved     OT SHORT TERM GOAL #3   Title R wrist AROM and PROM improve with 10-15 degrees to be able carry object on palm, wipe table and reach with wrist extention to grip    Baseline improving but not able to do all act    Time 4   Period Weeks   Status On-going   Target Date 02/22/17     OT SHORT TERM GOAL #4   Title Sensation improve in R hand to identify  ojbects in palm 50% of time    Baseline Semmes Weinstein Radial Nerve normal dorsal and except distal to MP of thumb , 4.56 volar hand except DIP and PIP of 5th , and 4.31 hand except volar 5th    Time 4   Period Weeks   Status On-going   Target Date 03/15/17           OT Long Term Goals - 01/25/17 1049      OT LONG TERM GOAL #1   Title Upgrade HEP to increase PIP and MC flexion as nerve is healing to grasp 6 inch object to 3 inch object   Baseline can use lat grip but not 3 point or full tight grip    Time 4   Period Weeks   Status On-going   Target Date 02/22/17     OT LONG TERM GOAL #2   Title Wrist ROM improve and strength for pt to intiate using 1 lbs weight for wrist in all planes    Baseline using 3 lbs for wrist    Status Achieved     OT LONG TERM GOAL #3  Title Assess shoulder and upgrade HEP to decrease pain and compensation    Status Achieved     OT LONG TERM GOAL #4   Title Pt to use 5 lbs at wrist and elbow without pain or discomfort to use in ADL's and IADL's    Baseline 3 lbs all planes    Time 4   Period  Weeks   Status On-going   Target Date 02/15/17               Plan - 01/25/17 1046    Clinical Impression Statement Pt cont to make progress on Semmes Weinstein , increase strength - cont to increase ROM , strenght , decrease scar tissue - nerve healing    Occupational performance deficits (Please refer to evaluation for details): ADL's;IADL's;Play;Work;Leisure   Rehab Potential Good   OT Frequency 2x / week   OT Duration 4 weeks   OT Treatment/Interventions Self-care/ADL training;Fluidtherapy;Splinting;Patient/family education;Therapeutic exercises;Ultrasound;Scar mobilization;Passive range of motion;Neuromuscular education;Electrical Stimulation;Manual Therapy   Plan upgrade HEP as needed    OT Home Exercise Plan see pt instruction   Consulted and Agree with Plan of Care Patient      Patient will benefit from skilled therapeutic intervention in order to improve the following deficits and impairments:  Decreased range of motion, Impaired flexibility, Decreased coordination, Decreased safety awareness, Increased edema, Impaired sensation, Decreased skin integrity, Decreased knowledge of precautions, Decreased knowledge of use of DME, Decreased scar mobility, Impaired UE functional use, Pain, Decreased strength, Impaired perceived functional ability  Visit Diagnosis: Stiffness of right wrist, not elsewhere classified  Pain in right arm  Stiffness of right hand, not elsewhere classified  Other disturbances of skin sensation  Scar condition and fibrosis of skin  Muscle weakness (generalized)    Problem List There are no active problems to display for this patient.   Rosalyn Gess OTR/L,CLT  01/25/2017, 10:51 AM  [facility] [address] [place], [address] Phone: [phone]   Fax:  [phone]  Name: Francisco Johns MRN: [id number] Date of Birth: 07/08/1975

## 2017-01-28 ENCOUNTER — Ambulatory Visit: Payer: 59 | Admitting: Occupational Therapy

## 2017-01-28 DIAGNOSIS — M25631 Stiffness of right wrist, not elsewhere classified: Secondary | ICD-10-CM | POA: Diagnosis not present

## 2017-01-28 DIAGNOSIS — M79601 Pain in right arm: Secondary | ICD-10-CM

## 2017-01-28 DIAGNOSIS — M25641 Stiffness of right hand, not elsewhere classified: Secondary | ICD-10-CM

## 2017-01-28 DIAGNOSIS — L905 Scar conditions and fibrosis of skin: Secondary | ICD-10-CM

## 2017-01-28 DIAGNOSIS — R208 Other disturbances of skin sensation: Secondary | ICD-10-CM | POA: Diagnosis not present

## 2017-01-28 DIAGNOSIS — M6281 Muscle weakness (generalized): Secondary | ICD-10-CM

## 2017-01-28 NOTE — Patient Instructions (Signed)
Same

## 2017-01-28 NOTE — Therapy (Signed)
[facility] [address], [place], [address] Phone: [phone]   Fax:  [phone]  Occupational Therapy Treatment  Patient Details  Name: GRAVIEL PAYEUR MRN: [id number] Date of Birth: [birth year] Referring Provider: Vernona Rieger  Encounter Date: 01/28/2017      OT End of Session - 01/28/17 1434    Visit Number 47   Number of Visits 55   Date for OT Re-Evaluation 02/13/17   OT Start Time 1402   OT Stop Time 1515   OT Time Calculation (min) 73 min   Activity Tolerance Patient tolerated treatment well   Behavior During Therapy [facility] for tasks assessed/performed      Past Medical History:  Diagnosis Date  . Metatarsal fracture    right big toe    Past Surgical History:  Procedure Laterality Date  . ankle surgery    . NO PAST SURGERIES    . ORIF TOE FRACTURE Right 12/09/2015   Procedure: OPEN REDUCTION INTERNAL FIXATION (ORIF) METATARSAL (TOE) FRACTURE, first metatarsal;  Surgeon: Renette Butters, MD;  Location: [place];  Service: Orthopedics;  Laterality: Right;    There were no vitals filed for this visit.      Subjective Assessment - 01/28/17 1427    Subjective  Did the exercises, try and use it more - taped the scar and pick up barrel with my forearm - see MD Wed    Patient Stated Goals I want as much movement and use I can get for my R dominant hand- to work , play with my kids, fish , play golf ,   Currently in Pain? No/denies      Scar massage using vibration proximal to graft - on harder fibrotic area - and scar adhesion - with thumb PA and RA  xtrator on scar adhesion proximal 10 x and with flexion of digitsAnd distal on graft 6 times -ROM gripping and thumb PA done this date - no irritation with extractor  Assess scar and where to apply kinesiotape Kinesiotape this date applied on 2 areas on graft -that are adhere - with resistance to 3 thru 5th digits, thumb PA  and wrist flexion - did 2 at 100 % pull - but 2 cm wide strips this date - star over distal scar and proximal adhesion on graft  Pt aware of precautions   BTE 701 at 7 lbs - place and hold for wrist extention -120sec  x 2 - wrapping hand into fist for one of them - other one open hand with 7lbs  Tool 504 not wrap into fist this date - and could do 120 sec -At 3lbs    5lbs weight for RD,UD atside , sup /pro - support 1/2 way Hand wrapped around weight - could to 20 reps Attempted 6 lbs - felt pull 5/10 at wrist  3 lbs wrap and done elbow flexion with palm down 10 reps and  Wrist extention place and hold 10 reps  Large knob on BTE 0 lbs - for partial sup and pronation - 120 sec each - collapse into palm - unable to extend digits and resist to turn with fingers - use more palm But could use thumb thru 3rd to turn with wrist neutral - 80 sec each     2 kg ball on wall for elbow , wrist extention - with shoulder  90 degrees - 3 x 45 sec    Large knob increase to 6 lbs  for RD, UD - 120 sec each in vertical position this - taped IP of thumb into extention  Gripper - 30lbs 120 sec - place and hold 3 sec - placing MC in partial flexion - not compensating as much with pulling this date - Sup and pronation doing key - using Benik splint 120 sec each at 1 lbs    Lat grip this date - 120 sec - increase to 20 lbs  bandaid with padding on volar IP of thumb to prevent hyper flexion of IP  - and pt to focus on pad - and not rotation of thumb with pinch  Estim at end see un attended - protocol at 14 .5 current - 2 x 2 cm thumb thenar eminence - and proximal forearm for Thumb PA  Buddy strap to cont with for 5th to be use when gripping or driving during day - for 30 min at time                          OT Education - 01/28/17 1430    Education provided Yes   Education Details 3-5 lbs for wrist , digits extention stablization    Person(s) Educated Patient    Methods Explanation;Demonstration;Tactile cues;Verbal cues   Comprehension Verbal cues required;Returned demonstration;Verbalized understanding          OT Short Term Goals - 01/25/17 1047      OT SHORT TERM GOAL #1   Title Pt to be ind in HEP to use splints correctly , increase ROM and sensatin in R hand and wrist    Status Achieved     OT SHORT TERM GOAL #2   Title Fabricate/ modify or change splints as needed to prevent contractures in R hand and wrist while nerve healing occur   Status Achieved     OT SHORT TERM GOAL #3   Title R wrist AROM and PROM improve with 10-15 degrees to be able carry object on palm, wipe table and reach with wrist extention to grip    Baseline improving but not able to do all act    Time 4   Period Weeks   Status On-going   Target Date 02/22/17     OT SHORT TERM GOAL #4   Title Sensation improve in R hand to identify  ojbects in palm 50% of time    Baseline Semmes Weinstein Radial Nerve normal dorsal and except distal to MP of thumb , 4.56 volar hand except DIP and PIP of 5th , and 4.31 hand except volar 5th    Time 4   Period Weeks   Status On-going   Target Date 03/15/17           OT Long Term Goals - 01/25/17 1049      OT LONG TERM GOAL #1   Title Upgrade HEP to increase PIP and MC flexion as nerve is healing to grasp 6 inch object to 3 inch object   Baseline can use lat grip but not 3 point or full tight grip    Time 4   Period Weeks   Status On-going   Target Date 02/22/17     OT LONG TERM GOAL #2   Title Wrist ROM improve and strength for pt to intiate using 1 lbs weight for wrist in all planes    Baseline using 3 lbs for wrist    Status Achieved     OT LONG TERM GOAL #3  Title Assess shoulder and upgrade HEP to decrease pain and compensation    Status Achieved     OT LONG TERM GOAL #4   Title Pt to use 5 lbs at wrist and elbow without pain or discomfort to use in ADL's and IADL's    Baseline 3 lbs all planes    Time  4   Period Weeks   Status On-going   Target Date 02/15/17               Plan - 01/28/17 1437    Clinical Impression Statement Pt cont to make slow but steady progress in strength at wrist , incease digits extention , scar improving - 5th digit cont to show no flexion - pt still with increase grip , collapse int oflexion but do show increase wrist extnetion - thumb extention worse than ABD - pt to see surgeon Wed    Occupational performance deficits (Please refer to evaluation for details): ADL's;IADL's;Work;Play;Leisure   Rehab Potential Good   OT Frequency 2x / week   OT Duration 4 weeks   OT Treatment/Interventions Self-care/ADL training;Fluidtherapy;Splinting;Patient/family education;Therapeutic exercises;Ultrasound;Scar mobilization;Passive range of motion;Neuromuscular education;Electrical Stimulation;Manual Therapy   Plan upgrade as needed -    Clinical Decision Making Multiple treatment options, significant modification of task necessary   OT Home Exercise Plan see pt instruction   Consulted and Agree with Plan of Care Patient      Patient will benefit from skilled therapeutic intervention in order to improve the following deficits and impairments:  Decreased range of motion, Impaired flexibility, Decreased coordination, Decreased safety awareness, Increased edema, Impaired sensation, Decreased skin integrity, Decreased knowledge of precautions, Decreased knowledge of use of DME, Decreased scar mobility, Impaired UE functional use, Pain, Decreased strength, Impaired perceived functional ability  Visit Diagnosis: Stiffness of right wrist, not elsewhere classified  Pain in right arm  Stiffness of right hand, not elsewhere classified  Other disturbances of skin sensation  Scar condition and fibrosis of skin  Muscle weakness (generalized)    Problem List There are no active problems to display for this patient.   Rosalyn Gess OTR/L,CLT 01/28/2017, 4:31  PM  [place] [address], [place], [address] Phone: [phone]   Fax:  [phone]  Name: ZAYDEN MAFFEI MRN: [id number] Date of Birth: 1975/05/24

## 2017-01-30 DIAGNOSIS — M6281 Muscle weakness (generalized): Secondary | ICD-10-CM | POA: Diagnosis not present

## 2017-01-30 DIAGNOSIS — T870X1 Complications of reattached (part of) right upper extremity: Secondary | ICD-10-CM | POA: Diagnosis not present

## 2017-01-30 DIAGNOSIS — M25641 Stiffness of right hand, not elsewhere classified: Secondary | ICD-10-CM | POA: Diagnosis not present

## 2017-01-30 DIAGNOSIS — S68411S Complete traumatic amputation of right hand at wrist level, sequela: Secondary | ICD-10-CM | POA: Diagnosis not present

## 2017-01-31 ENCOUNTER — Ambulatory Visit: Payer: 59 | Admitting: Occupational Therapy

## 2017-01-31 DIAGNOSIS — M6281 Muscle weakness (generalized): Secondary | ICD-10-CM | POA: Diagnosis not present

## 2017-01-31 DIAGNOSIS — L905 Scar conditions and fibrosis of skin: Secondary | ICD-10-CM

## 2017-01-31 DIAGNOSIS — M25641 Stiffness of right hand, not elsewhere classified: Secondary | ICD-10-CM

## 2017-01-31 DIAGNOSIS — M79601 Pain in right arm: Secondary | ICD-10-CM

## 2017-01-31 DIAGNOSIS — R208 Other disturbances of skin sensation: Secondary | ICD-10-CM | POA: Diagnosis not present

## 2017-01-31 DIAGNOSIS — M25631 Stiffness of right wrist, not elsewhere classified: Secondary | ICD-10-CM | POA: Diagnosis not present

## 2017-01-31 NOTE — Therapy (Signed)
[facility] [address] [place], [address] Phone: [phone]   Fax:  [phone]  Occupational Therapy Treatment  Patient Details  Name: Francisco Johns MRN: [id number] Date of Birth: 1975/06/01 Referring Provider: Vernona Rieger  Encounter Date: 01/31/2017      OT End of Session - 01/31/17 1144    Visit Number 48   Number of Visits 55   Date for OT Re-Evaluation 02/13/17   OT Start Time 0955   OT Stop Time 1100   OT Time Calculation (min) 65 min   Activity Tolerance Patient tolerated treatment well   Behavior During Therapy [facility] for tasks assessed/performed      Past Medical History:  Diagnosis Date  . Metatarsal fracture    right big toe    Past Surgical History:  Procedure Laterality Date  . ankle surgery    . NO PAST SURGERIES    . ORIF TOE FRACTURE Right 12/09/2015   Procedure: OPEN REDUCTION INTERNAL FIXATION (ORIF) METATARSAL (TOE) FRACTURE, first metatarsal;  Surgeon: Renette Butters, MD;  Location: [place];  Service: Orthopedics;  Laterality: Right;    There were no vitals filed for this visit.      Subjective Assessment - 01/31/17 1142    Subjective  Seen Dr Vernona Rieger - was happy with progress- scar tissue still and waiting for nerve - ulnar nerve slowest- no limitations - and recommend splint for the pinkie maybe    Patient Stated Goals I want as much movement and use I can get for my R dominant hand- to work , play with my kids, fish , play golf ,   Currently in Pain? No/denies      Scar massage using vibration proximal to graft - on harder fibrotic area - and scar adhesion - with thumb PA and RA  xtrator on scar adhesion proximal 10 x and with flexion of digitsAnd distal on graft 6 times -ROM gripping and thumb PA done this date - no irritation with extractor  Assess scar and where to apply kinesiotape Kinesiotape this date applied on 2 areas on graft -that  are adhere - with resistance to 3 thru 5th digits, thumb PA and wrist flexion - did 2 at 100 % pull - but 2 cm wide strips this date - star over distal scar and proximal adhesion on graft  Pt aware of precautions    Tool 504 not wrap into fist this date - and could do 120 sec -At 3lbs    Small knob for pronation and sup 0 lbs  - using lat grip - tape IP of thumb in extention  100 sec each  And done RD ,UD small knob lat grip same as above  0 lbs 100 sec      Gripper - 30lbs 120 sec - place and hold 3 sec - placing MC in partial flexion - not compensating as much with pulling this date -     Lat grip this date - 120 sec - increase to 20 lbs  bandaid with padding on volar IP of thumb to prevent hyper flexion of IP  - and pt to focus on pad - and not rotation of thumb with pinch  Estim at end see un attended - protocol at 19 current - 2 x 2 cm thumb thenar eminence - and proximal forearm for Thumb RA - with place and hold  Pt showed 5th digit flexion for about 50 degrees at  PIP during tools on BTE  Attempted making MC block finger splint to wear to increase PIP flexion -but did not keep MC enough at 0 degrees extention  Pt do better blocking it - and can do 70 degrees of flexion at PIP  To do at home several times                           OT Education - 01/31/17 1144    Education provided Yes   Education Details 5th digit flexion - bring in his old dynamic splints    Person(s) Educated Patient   Methods Explanation;Demonstration;Tactile cues;Verbal cues   Comprehension Verbal cues required;Returned demonstration;Verbalized understanding          OT Short Term Goals - 01/25/17 1047      OT SHORT TERM GOAL #1   Title Pt to be ind in HEP to use splints correctly , increase ROM and sensatin in R hand and wrist    Status Achieved     OT SHORT TERM GOAL #2   Title Fabricate/ modify or change splints as needed to prevent contractures in R  hand and wrist while nerve healing occur   Status Achieved     OT SHORT TERM GOAL #3   Title R wrist AROM and PROM improve with 10-15 degrees to be able carry object on palm, wipe table and reach with wrist extention to grip    Baseline improving but not able to do all act    Time 4   Period Weeks   Status On-going   Target Date 02/22/17     OT SHORT TERM GOAL #4   Title Sensation improve in R hand to identify  ojbects in palm 50% of time    Baseline Semmes Weinstein Radial Nerve normal dorsal and except distal to MP of thumb , 4.56 volar hand except DIP and PIP of 5th , and 4.31 hand except volar 5th    Time 4   Period Weeks   Status On-going   Target Date 03/15/17           OT Long Term Goals - 01/25/17 1049      OT LONG TERM GOAL #1   Title Upgrade HEP to increase PIP and MC flexion as nerve is healing to grasp 6 inch object to 3 inch object   Baseline can use lat grip but not 3 point or full tight grip    Time 4   Period Weeks   Status On-going   Target Date 02/22/17     OT LONG TERM GOAL #2   Title Wrist ROM improve and strength for pt to intiate using 1 lbs weight for wrist in all planes    Baseline using 3 lbs for wrist    Status Achieved     OT LONG TERM GOAL #3   Title Assess shoulder and upgrade HEP to decrease pain and compensation    Status Achieved     OT LONG TERM GOAL #4   Title Pt to use 5 lbs at wrist and elbow without pain or discomfort to use in ADL's and IADL's    Baseline 3 lbs all planes    Time 4   Period Weeks   Status On-going   Target Date 02/15/17               Plan - 01/31/17 1145    Clinical Impression Statement Pt cont to show progress - scar  tissue still present but limiting pt and nerve healing - 5th digit MC flexion when attempting to make fist - and can do 50-70 degrees of PIP flexion if MC block into full extention - pt to do several times during day and  will bring his old dynamic splints in to see if can modify if  needed - MD order also said no limitations    Occupational performance deficits (Please refer to evaluation for details): ADL's;IADL's;Work;Leisure;Play   Rehab Potential Good   OT Frequency 2x / week   OT Duration 4 weeks   OT Treatment/Interventions Self-care/ADL training;Fluidtherapy;Splinting;Patient/family education;Therapeutic exercises;Ultrasound;Scar mobilization;Passive range of motion;Neuromuscular education;Electrical Stimulation;Manual Therapy   Plan assess need for dynamic splint    Clinical Decision Making Multiple treatment options, significant modification of task necessary   OT Home Exercise Plan see pt instruction   Consulted and Agree with Plan of Care Patient      Patient will benefit from skilled therapeutic intervention in order to improve the following deficits and impairments:  Decreased range of motion, Impaired flexibility, Decreased coordination, Decreased safety awareness, Increased edema, Impaired sensation, Decreased skin integrity, Decreased knowledge of precautions, Decreased knowledge of use of DME, Decreased scar mobility, Impaired UE functional use, Pain, Decreased strength, Impaired perceived functional ability  Visit Diagnosis: Stiffness of right wrist, not elsewhere classified  Pain in right arm  Stiffness of right hand, not elsewhere classified  Other disturbances of skin sensation  Scar condition and fibrosis of skin  Muscle weakness (generalized)    Problem List There are no active problems to display for this patient.   Rosalyn Gess OTR/L,CLT 01/31/2017, 11:50 AM  [facility] [address] [place], [address] Phone: [phone]   Fax:  [phone]  Name: Francisco Johns MRN: [id number] Date of Birth: 07-Mar-1976

## 2017-01-31 NOTE — Patient Instructions (Signed)
Block 5th several times during day to increase PIP flexion -has about 70 degrees of PIP flexion if blocked

## 2017-02-06 ENCOUNTER — Ambulatory Visit: Payer: 59 | Admitting: Occupational Therapy

## 2017-02-06 DIAGNOSIS — M79601 Pain in right arm: Secondary | ICD-10-CM

## 2017-02-06 DIAGNOSIS — M25641 Stiffness of right hand, not elsewhere classified: Secondary | ICD-10-CM

## 2017-02-06 DIAGNOSIS — R208 Other disturbances of skin sensation: Secondary | ICD-10-CM | POA: Diagnosis not present

## 2017-02-06 DIAGNOSIS — M25631 Stiffness of right wrist, not elsewhere classified: Secondary | ICD-10-CM | POA: Diagnosis not present

## 2017-02-06 DIAGNOSIS — L905 Scar conditions and fibrosis of skin: Secondary | ICD-10-CM

## 2017-02-06 DIAGNOSIS — M6281 Muscle weakness (generalized): Secondary | ICD-10-CM

## 2017-02-06 NOTE — Patient Instructions (Signed)
Same

## 2017-02-06 NOTE — Therapy (Signed)
[facility] [address], [place], [address] Phone: [phone]   Fax:  [phone]  Occupational Therapy Treatment  Patient Details  Name: Francisco Johns MRN: [id number] Date of Birth: 04-30-1975 Referring Provider: Vernona Rieger  Encounter Date: 02/06/2017      OT End of Session - 02/06/17 2010    Visit Number 49   Number of Visits 55   Date for OT Re-Evaluation 02/13/17   OT Start Time 1005   OT Stop Time 1106   OT Time Calculation (min) 61 min   Activity Tolerance Patient tolerated treatment well   Behavior During Therapy [facility] for tasks assessed/performed      Past Medical History:  Diagnosis Date  . Metatarsal fracture    right big toe    Past Surgical History:  Procedure Laterality Date  . ankle surgery    . NO PAST SURGERIES    . ORIF TOE FRACTURE Right 12/09/2015   Procedure: OPEN REDUCTION INTERNAL FIXATION (ORIF) METATARSAL (TOE) FRACTURE, first metatarsal;  Surgeon: Renette Butters, MD;  Location: [place];  Service: Orthopedics;  Laterality: Right;    There were no vitals filed for this visit.      Subjective Assessment - 02/06/17 2009    Subjective  Doing okay - no change really   Patient Stated Goals I want as much movement and use I can get for my R dominant hand- to work , play with my kids, fish , play golf ,   Currently in Pain? No/denies        Tool 504 not wrap into fist this date - and could do 120 sec -At 3lbs    Large  knob for pronation and sup 0 lbs  -- tape IP of thumb in extention  100 sec each  And done RD ,UD large knob on horizontal position p same as above  0 lbs 100 sec     Gripper - 32lbs 120 sec - place and hold 3 sec - placing MC in partial flexion - not compensating as much with pulling this date -     Lat grip this date - 120 sec - increase to 20 lbs  bandaid with padding on volar IP of thumb to prevent hyper  flexion of IP - and pt to focus on pad - and not rotation of thumb with pinch  Estim at end see un attended - protocol at 19 current - 2 x 2 cm thumb thenar eminence - and proximal forearm for Thumb RA - with place and hold  Pt showed 5th digit flexion for PIP  blocking it - and can do 70 degrees of flexion at PIP  To do at home several times                          OT Education - 02/06/17 2009    Education provided Yes   Education Details cont blocking 5th PIP to do AROM    Person(s) Educated Patient   Methods Explanation;Demonstration;Tactile cues   Comprehension Verbalized understanding;Returned demonstration;Verbal cues required          OT Short Term Goals - 01/25/17 1047      OT SHORT TERM GOAL #1   Title Pt to be ind in HEP to use splints correctly , increase ROM and sensatin in R hand and wrist    Status Achieved     OT SHORT TERM  GOAL #2   Title Fabricate/ modify or change splints as needed to prevent contractures in R hand and wrist while nerve healing occur   Status Achieved     OT SHORT TERM GOAL #3   Title R wrist AROM and PROM improve with 10-15 degrees to be able carry object on palm, wipe table and reach with wrist extention to grip    Baseline improving but not able to do all act    Time 4   Period Weeks   Status On-going   Target Date 02/22/17     OT SHORT TERM GOAL #4   Title Sensation improve in R hand to identify  ojbects in palm 50% of time    Baseline Semmes Weinstein Radial Nerve normal dorsal and except distal to MP of thumb , 4.56 volar hand except DIP and PIP of 5th , and 4.31 hand except volar 5th    Time 4   Period Weeks   Status On-going   Target Date 03/15/17           OT Long Term Goals - 01/25/17 1049      OT LONG TERM GOAL #1   Title Upgrade HEP to increase PIP and MC flexion as nerve is healing to grasp 6 inch object to 3 inch object   Baseline can use lat grip but not 3 point or full tight grip    Time  4   Period Weeks   Status On-going   Target Date 02/22/17     OT LONG TERM GOAL #2   Title Wrist ROM improve and strength for pt to intiate using 1 lbs weight for wrist in all planes    Baseline using 3 lbs for wrist    Status Achieved     OT LONG TERM GOAL #3   Title Assess shoulder and upgrade HEP to decrease pain and compensation    Status Achieved     OT LONG TERM GOAL #4   Title Pt to use 5 lbs at wrist and elbow without pain or discomfort to use in ADL's and IADL's    Baseline 3 lbs all planes    Time 4   Period Weeks   Status On-going   Target Date 02/15/17               Plan - 02/06/17 2011    Clinical Impression Statement pt cont to make progress in strength at wrist , grip , 5th PIP flexion if blocked - assess if need dynamic splint of 5th next session    Occupational performance deficits (Please refer to evaluation for details): ADL's;IADL's;Work;Play;Leisure   Rehab Potential Good   OT Frequency 2x / week   OT Duration 2 weeks   OT Treatment/Interventions Self-care/ADL training;Fluidtherapy;Splinting;Patient/family education;Therapeutic exercises;Ultrasound;Scar mobilization;Passive range of motion;Neuromuscular education;Electrical Stimulation;Manual Therapy   Clinical Decision Making Multiple treatment options, significant modification of task necessary   OT Home Exercise Plan see pt instruction   Consulted and Agree with Plan of Care Patient      Patient will benefit from skilled therapeutic intervention in order to improve the following deficits and impairments:  Decreased range of motion, Impaired flexibility, Decreased coordination, Decreased safety awareness, Increased edema, Impaired sensation, Decreased skin integrity, Decreased knowledge of precautions, Decreased knowledge of use of DME, Decreased scar mobility, Impaired UE functional use, Pain, Decreased strength, Impaired perceived functional ability  Visit Diagnosis: Stiffness of right wrist,  not elsewhere classified  Pain in right arm  Stiffness of right hand,  not elsewhere classified  Other disturbances of skin sensation  Scar condition and fibrosis of skin  Muscle weakness (generalized)    Problem List There are no active problems to display for this patient.   Rosalyn Gess  OTR/L,CLT 02/06/2017, 8:14 PM  [place] [facility] [address] [place], [address] Phone: [phone]   Fax:  [phone]  Name: Francisco Johns MRN: [id number] Date of Birth: 1975-08-31

## 2017-02-07 ENCOUNTER — Ambulatory Visit: Payer: 59 | Admitting: Occupational Therapy

## 2017-02-07 DIAGNOSIS — R208 Other disturbances of skin sensation: Secondary | ICD-10-CM

## 2017-02-07 DIAGNOSIS — M25631 Stiffness of right wrist, not elsewhere classified: Secondary | ICD-10-CM

## 2017-02-07 DIAGNOSIS — M79601 Pain in right arm: Secondary | ICD-10-CM

## 2017-02-07 DIAGNOSIS — M6281 Muscle weakness (generalized): Secondary | ICD-10-CM | POA: Diagnosis not present

## 2017-02-07 DIAGNOSIS — M25641 Stiffness of right hand, not elsewhere classified: Secondary | ICD-10-CM

## 2017-02-07 DIAGNOSIS — L905 Scar conditions and fibrosis of skin: Secondary | ICD-10-CM | POA: Diagnosis not present

## 2017-02-07 NOTE — Patient Instructions (Signed)
Work on picking up [employer] with 3 point instead with only 2 point   And work on pick up objects with wrist neutral  And do 2 -3 lbs weight wrist HEP with out wrap - assess if can keep wrist neutral  Several times during day - PIP flexion of 5th blocked

## 2017-02-07 NOTE — Therapy (Signed)
[facility] [address], [place], [address] Phone: [phone]   Fax:  [phone]  Occupational Therapy Treatment  Patient Details  Name: Francisco Johns MRN: [id number] Date of Birth: [birth year] Referring Provider: Vernona Rieger  Encounter Date: 02/07/2017      OT End of Session - 02/07/17 1132    Visit Number 50   Number of Visits 55   Date for OT Re-Evaluation 02/13/17   OT Start Time 1005   OT Stop Time 1115   OT Time Calculation (min) 70 min   Activity Tolerance Patient tolerated treatment well   Behavior During Therapy [facility] for tasks assessed/performed      Past Medical History:  Diagnosis Date  . Metatarsal fracture    right big toe    Past Surgical History:  Procedure Laterality Date  . ankle surgery    . NO PAST SURGERIES    . ORIF TOE FRACTURE Right 12/09/2015   Procedure: OPEN REDUCTION INTERNAL FIXATION (ORIF) METATARSAL (TOE) FRACTURE, first metatarsal;  Surgeon: Renette Butters, MD;  Location: [facility];  Service: Orthopedics;  Laterality: Right;    There were no vitals filed for this visit.      Subjective Assessment - 02/07/17 1129    Subjective  I would feel much better if my pinkie start working and able to grip objects better    Patient Stated Goals I want as much movement and use I can get for my R dominant hand- to work , play with my kids, fish , play golf ,   Currently in Pain? No/denies       Scar massage using vibration proximal to graft - on harder fibrotic area - and scar adhesion - with thumb PA and RA  xtrator on scar adhesion proximal 10 x and with flexion of digitsAnd distal on graft 6 times -ROM gripping and thumb PA done this date - no irritation with extractor  Assess scar and where to apply kinesiotape Kinesiotape this date applied on 2 areas on graft -that are adhere - with resistance to 3 thru 5th digits, thumb PA and wrist flexion -  did 2 at 100 % pull - but 2 cm wide strips this date - star over distal scar and proximal adhesion on graft  Pt aware of precautions    Tool 502 not wrap into fist this date- smaller grip and able to do  - and could do 120 sec -At 2 lbs  With smaller screwdriver tool done sup and pro at 2 lbs - 100 sec each - Benik wrist splint on and taped thumb    Larger knob for RD, UD 120 sec each - 7 lbs  - tape IP of thumb in extention      Gripper - 35lbs 120 sec - place and hold 3 sec - able to show about 60 degrees of PIP flexion at 5th digit  - not compensating as much with pulling this date -     Lat grip this date - 120 sec -  20 lbs  bandaid with padding on volar IP of thumb to prevent hyper flexion of IP - and pt to focus on pad - and not rotation of thumb with pinch Done place and hold thumb extention - with tool at 0 lbs - 80 sec    Assess use of 2point pinch and 3 point pinch to pick up - opposition ofthumb - and wrist in neutral -  decrease ability to pick up or grip with using 3rd digit during 3 point pinch - better with 2 point pinch  Pick up wider objects with 3 point - and whole hand- wrist collapse into flexion during picking up and using more digits for tighter or composite flexion - ?? Scar tissue   Estim at end see un attended - protocol at 19 current - 2 x 2 cm thumb thenar eminence - and proximal forearm for Thumb RA - with place and hold  Pt showed 5th digit flexion for about 60 degrees at PIP during tools on BTE  Pt do better blocking it - and can do 70 degrees of flexion at PIP  To do at home several times                       OT Education - 02/07/17 1131    Education provided Yes   Education Details functional 3 point grip    Person(s) Educated Patient   Methods Explanation;Demonstration;Tactile cues;Verbal cues   Comprehension Verbal cues required;Returned demonstration;Verbalized understanding          OT Short Term  Goals - 01/25/17 1047      OT SHORT TERM GOAL #1   Title Pt to be ind in HEP to use splints correctly , increase ROM and sensatin in R hand and wrist    Status Achieved     OT SHORT TERM GOAL #2   Title Fabricate/ modify or change splints as needed to prevent contractures in R hand and wrist while nerve healing occur   Status Achieved     OT SHORT TERM GOAL #3   Title R wrist AROM and PROM improve with 10-15 degrees to be able carry object on palm, wipe table and reach with wrist extention to grip    Baseline improving but not able to do all act    Time 4   Period Weeks   Status On-going   Target Date 02/22/17     OT SHORT TERM GOAL #4   Title Sensation improve in R hand to identify  ojbects in palm 50% of time    Baseline Semmes Weinstein Radial Nerve normal dorsal and except distal to MP of thumb , 4.56 volar hand except DIP and PIP of 5th , and 4.31 hand except volar 5th    Time 4   Period Weeks   Status On-going   Target Date 03/15/17           OT Long Term Goals - 01/25/17 1049      OT LONG TERM GOAL #1   Title Upgrade HEP to increase PIP and MC flexion as nerve is healing to grasp 6 inch object to 3 inch object   Baseline can use lat grip but not 3 point or full tight grip    Time 4   Period Weeks   Status On-going   Target Date 02/22/17     OT LONG TERM GOAL #2   Title Wrist ROM improve and strength for pt to intiate using 1 lbs weight for wrist in all planes    Baseline using 3 lbs for wrist    Status Achieved     OT LONG TERM GOAL #3   Title Assess shoulder and upgrade HEP to decrease pain and compensation    Status Achieved     OT LONG TERM GOAL #4   Title Pt to use 5 lbs at wrist and elbow without pain or  discomfort to use in ADL's and IADL's    Baseline 3 lbs all planes    Time 4   Period Weeks   Status On-going   Target Date 02/15/17               Plan - 02/07/17 1132    Clinical Impression Statement Pt able to use 2 point pinch to pick  up smaller objects with wrist neutral - if trying to use 3rd - increase wrist flexion and thumb cannot opposite to assist - to work on 3 point pinch and gripping 2-3 lbs with wrist neutral and  fist not wrap - able to use smaller tool for wrist extention with grip  this date    Occupational performance deficits (Please refer to evaluation for details): ADL's;IADL's;Work;Play;Leisure   Rehab Potential Good   OT Frequency 2x / week   OT Duration 4 weeks   OT Treatment/Interventions Self-care/ADL training;Fluidtherapy;Splinting;Patient/family education;Therapeutic exercises;Ultrasound;Scar mobilization;Passive range of motion;Neuromuscular education;Electrical Stimulation;Manual Therapy   Plan assess cont progress and upgrade HEP as needed    Clinical Decision Making Multiple treatment options, significant modification of task necessary   OT Home Exercise Plan see pt instruction   Consulted and Agree with Plan of Care Patient      Patient will benefit from skilled therapeutic intervention in order to improve the following deficits and impairments:  Decreased range of motion, Impaired flexibility, Decreased coordination, Decreased safety awareness, Increased edema, Impaired sensation, Decreased skin integrity, Decreased knowledge of precautions, Decreased knowledge of use of DME, Decreased scar mobility, Impaired UE functional use, Pain, Decreased strength, Impaired perceived functional ability  Visit Diagnosis: Stiffness of right wrist, not elsewhere classified  Pain in right arm  Stiffness of right hand, not elsewhere classified  Other disturbances of skin sensation  Scar condition and fibrosis of skin  Muscle weakness (generalized)    Problem List There are no active problems to display for this patient.   Rosalyn Gess OTR/L,CLT 02/07/2017, 11:35 AM  [facility] [address] [place], [address] Phone:  [phone]   Fax:  [phone]  Name: Francisco Johns MRN: [id number] Date of Birth: 1975/10/09

## 2017-02-13 ENCOUNTER — Ambulatory Visit: Payer: 59 | Admitting: Occupational Therapy

## 2017-02-13 DIAGNOSIS — L905 Scar conditions and fibrosis of skin: Secondary | ICD-10-CM | POA: Diagnosis not present

## 2017-02-13 DIAGNOSIS — M25641 Stiffness of right hand, not elsewhere classified: Secondary | ICD-10-CM | POA: Diagnosis not present

## 2017-02-13 DIAGNOSIS — M6281 Muscle weakness (generalized): Secondary | ICD-10-CM

## 2017-02-13 DIAGNOSIS — M79601 Pain in right arm: Secondary | ICD-10-CM | POA: Diagnosis not present

## 2017-02-13 DIAGNOSIS — M25631 Stiffness of right wrist, not elsewhere classified: Secondary | ICD-10-CM | POA: Diagnosis not present

## 2017-02-13 DIAGNOSIS — R208 Other disturbances of skin sensation: Secondary | ICD-10-CM

## 2017-02-13 NOTE — Patient Instructions (Addendum)
Same - put on Benik to increase stability at wrist during gripping

## 2017-02-13 NOTE — Therapy (Signed)
[facility] [address] [place], [address] Phone: [phone]   Fax:  [phone]  Occupational Therapy Treatment  Patient Details  Name: Francisco Johns MRN: [id number] Date of Birth: 16-Jul-1975 Referring Provider: Vernona Rieger  Encounter Date: 02/13/2017      OT End of Session - 02/13/17 1040    Visit Number 51   Number of Visits 56   Date for OT Re-Evaluation 05/16/17   OT Start Time 1002   OT Stop Time 1112   OT Time Calculation (min) 70 min   Activity Tolerance Patient tolerated treatment well   Behavior During Therapy [facility] for tasks assessed/performed      Past Medical History:  Diagnosis Date  . Metatarsal fracture    right big toe    Past Surgical History:  Procedure Laterality Date  . ankle surgery    . NO PAST SURGERIES    . ORIF TOE FRACTURE Right 12/09/2015   Procedure: OPEN REDUCTION INTERNAL FIXATION (ORIF) METATARSAL (TOE) FRACTURE, first metatarsal;  Surgeon: Renette Butters, MD;  Location: [facility];  Service: Orthopedics;  Laterality: Right;    There were no vitals filed for this visit.      Subjective Assessment - 02/13/17 1033    Subjective  I try to use my hand more - but it take so slow - like doing buttons and tying shoes    Patient Stated Goals I want as much movement and use I can get for my R dominant hand- to work , play with my kids, fish , play golf ,   Currently in Pain? No/denies            [facility] OT Assessment - 02/13/17 0001      AROM   Right Wrist Extension 50 Degrees     Strength   Right Hand Grip (lbs) 15  20 with Benik and supported    Right Hand Lateral Pinch 5 lbs     Assess grip and lat grip - wrist extention and scar - grip increase by 10 lbs - did support on thigh  Scar massage using vibration proximal to graft - on harder fibrotic area - and scar adhesion - with thumb PA and RA  xtrator on scar adhesion proximal 10 x and  with flexion of digitsAnd distal on graft 6 times -ROM gripping and thumb PA done this date - no irritation with extractor Done graston tool nr 2 on volar forearm - proximal and lateral to graft   Assess scar and where to apply kinesiotape Kinesiotape this date applied on 2 areas on graft -that are adhere - with resistance to 3 thru 5th digits, thumb PA and wrist flexion - did 2 at 100 % pull - but 2 cm wide strips this date - star over distal scar and proximal adhesion on graft  Pt aware of precautions   Pt able to do wrist active extention past neutral with making partial active fist - and tolerate resistance   BTE 701 at 4 lbs - place and hold for wrist extention -120sec  x 2 - wrapping hand into fist for one of them at 7 lbs  - other one open hand with 4lbs  Tool 502( small)  not wrap into fist this date - and could do 120 sec -At 2lbs    Large knob 6 lbs for RD, UD - 120 sec each in hoizontal  position this - taped IP of thumb  into extention  Gripper - 35lbs 120 sec - place and hold 3 sec - placing MC in partial flexion - not compensating as much with pulling this date - graft not has much adhere this date -  Sup and pronation doing key tool - using Benik splint and taped IP of thumb to increase extention  120 sec each at 1 lbs    Lat grip this date - 120 sec - increase to 20 lbs  kinesiotape IP into extention - to block from hyper flexion- 14 lbs - and pt to focus on pad - and not rotation of thumb with pinch  Estim at end see un attended - protocol at 14 .5 current - 2 x 2 cm thumb thenar eminence - and proximal forearm for Thumb PA                        OT Education - 02/13/17 1039    Education provided Yes   Education Details USing hand  - even if slow and using Benik to give more support at wrist during Gripping    Person(s) Educated Patient   Methods Explanation;Demonstration;Tactile cues;Verbal cues   Comprehension Verbal cues  required;Verbalized understanding;Returned demonstration          OT Short Term Goals - 01/25/17 1047      OT SHORT TERM GOAL #1   Title Pt to be ind in HEP to use splints correctly , increase ROM and sensatin in R hand and wrist    Status Achieved     OT SHORT TERM GOAL #2   Title Fabricate/ modify or change splints as needed to prevent contractures in R hand and wrist while nerve healing occur   Status Achieved     OT SHORT TERM GOAL #3   Title R wrist AROM and PROM improve with 10-15 degrees to be able carry object on palm, wipe table and reach with wrist extention to grip    Baseline improving but not able to do all act    Time 4   Period Weeks   Status On-going   Target Date 02/22/17     OT SHORT TERM GOAL #4   Title Sensation improve in R hand to identify  ojbects in palm 50% of time    Baseline Semmes Weinstein Radial Nerve normal dorsal and except distal to MP of thumb , 4.56 volar hand except DIP and PIP of 5th , and 4.31 hand except volar 5th    Time 4   Period Weeks   Status On-going   Target Date 03/15/17           OT Long Term Goals - 01/25/17 1049      OT LONG TERM GOAL #1   Title Upgrade HEP to increase PIP and MC flexion as nerve is healing to grasp 6 inch object to 3 inch object   Baseline can use lat grip but not 3 point or full tight grip    Time 4   Period Weeks   Status On-going   Target Date 02/22/17     OT LONG TERM GOAL #2   Title Wrist ROM improve and strength for pt to intiate using 1 lbs weight for wrist in all planes    Baseline using 3 lbs for wrist    Status Achieved     OT LONG TERM GOAL #3   Title Assess shoulder and upgrade HEP to decrease pain and compensation    Status  Achieved     OT LONG TERM GOAL #4   Title Pt to use 5 lbs at wrist and elbow without pain or discomfort to use in ADL's and IADL's    Baseline 3 lbs all planes    Time 4   Period Weeks   Status On-going   Target Date 02/15/17               Plan  - 02/13/17 1059    Clinical Impression Statement Pt grip improve with Benik splint on wrist to 20 lbs from 15lbs -need to rest arm on thigh- pt do show decrease adhesions at graft during gripping on BTE and able to do partial active fist  with able to do wrist exention past neutral - slow but steady progress    Occupational performance deficits (Please refer to evaluation for details): ADL's;IADL's;Work;Play;Leisure   Rehab Potential Good   OT Frequency 2x / week   OT Duration 4 weeks   OT Treatment/Interventions Self-care/ADL training;Fluidtherapy;Splinting;Patient/family education;Therapeutic exercises;Ultrasound;Scar mobilization;Passive range of motion;Neuromuscular education;Electrical Stimulation;Manual Therapy   Clinical Decision Making Multiple treatment options, significant modification of task necessary   OT Home Exercise Plan see pt instruction   Consulted and Agree with Plan of Care Patient      Patient will benefit from skilled therapeutic intervention in order to improve the following deficits and impairments:  Decreased range of motion, Impaired flexibility, Decreased coordination, Decreased safety awareness, Increased edema, Impaired sensation, Decreased skin integrity, Decreased knowledge of precautions, Decreased knowledge of use of DME, Decreased scar mobility, Impaired UE functional use, Pain, Decreased strength, Impaired perceived functional ability  Visit Diagnosis: Stiffness of right wrist, not elsewhere classified - Plan: Ot plan of care cert/re-cert  Pain in right arm - Plan: Ot plan of care cert/re-cert  Stiffness of right hand, not elsewhere classified - Plan: Ot plan of care cert/re-cert  Other disturbances of skin sensation - Plan: Ot plan of care cert/re-cert  Scar condition and fibrosis of skin - Plan: Ot plan of care cert/re-cert  Muscle weakness (generalized) - Plan: Ot plan of care cert/re-cert    Problem List There are no active problems to display  for this patient.   Rosalyn Gess OTR/L,CLT 02/13/2017, 12:45 PM  [facility] [address], [place], [address] Phone: [phone]   Fax:  [phone]  Name: CHRISTON PARADA MRN: [id number] Date of Birth: [birth year]

## 2017-02-15 ENCOUNTER — Ambulatory Visit: Payer: 59 | Admitting: Occupational Therapy

## 2017-02-15 DIAGNOSIS — M25641 Stiffness of right hand, not elsewhere classified: Secondary | ICD-10-CM

## 2017-02-15 DIAGNOSIS — M6281 Muscle weakness (generalized): Secondary | ICD-10-CM

## 2017-02-15 DIAGNOSIS — M79601 Pain in right arm: Secondary | ICD-10-CM | POA: Diagnosis not present

## 2017-02-15 DIAGNOSIS — L905 Scar conditions and fibrosis of skin: Secondary | ICD-10-CM

## 2017-02-15 DIAGNOSIS — R208 Other disturbances of skin sensation: Secondary | ICD-10-CM

## 2017-02-15 DIAGNOSIS — M25631 Stiffness of right wrist, not elsewhere classified: Secondary | ICD-10-CM | POA: Diagnosis not present

## 2017-02-15 NOTE — Patient Instructions (Signed)
Wrist extention - with 3 lbs horizontal - with elbow to side   ball on wall- with fist - to work on wrist stabilization

## 2017-02-15 NOTE — Therapy (Signed)
[facility] [address], [place], [address] Phone: [phone]   Fax:  [phone]  Occupational Therapy Treatment  Patient Details  Name: Francisco Johns MRN: [id number] Date of Birth: June 26, 1975 Referring Provider: Vernona Rieger  Encounter Date: 02/15/2017      OT End of Session - 02/15/17 1429    Visit Number 52   Number of Visits 61   Date for OT Re-Evaluation 05/16/17   OT Start Time 0630   OT Stop Time 1153   OT Time Calculation (min) 69 min   Activity Tolerance Patient tolerated treatment well      Past Medical History:  Diagnosis Date  . Metatarsal fracture    right big toe    Past Surgical History:  Procedure Laterality Date  . ankle surgery    . NO PAST SURGERIES    . ORIF TOE FRACTURE Right 12/09/2015   Procedure: OPEN REDUCTION INTERNAL FIXATION (ORIF) METATARSAL (TOE) FRACTURE, first metatarsal;  Surgeon: Renette Butters, MD;  Location: [facility];  Service: Orthopedics;  Laterality: Right;    There were no vitals filed for this visit.      Subjective Assessment - 02/15/17 1427    Subjective  Doing okay - no  changes since last time    Patient Stated Goals I want as much movement and use I can get for my R dominant hand- to work , play with my kids, fish , play golf ,   Currently in Pain? No/denies     Scar massage using vibration proximal to graft - on harder fibrotic area - and scar adhesion - with thumb PA and RA  xtrator on scar adhesion proximal 10 x and with flexion of digitsAnd distal on graft 6 times -ROM gripping and thumb PA done this date - no irritation with extractor Done graston tool nr 2 on volar forearm - proximal and lateral to graft   Assess scar and where to apply kinesiotape Kinesiotape this date applied on 2 areas on graft -that are adhere - with resistance to 3 thru 5th digits, thumb PA and wrist flexion - did 2 at 100 % pull - but 2 cm  wide strips this date - star over distal scar and proximal adhesion on graft  Pt aware of precautions   3 lbs for wrist extention in horizontal plane - with elbow to side - want to compensate with digits extention and hold on the weight only with thumb - but able to get past neutral - to do at home   BTE 701 at 4 lbs open hand  - place and hold for wrist extention -120sec  x 2 - wrapping hand into fist for one of them at 7 lbs    Tool 502( small) for wrist flexion and extention   not wrap into fist this date - and could do 120 sec -At 2lbs    Large knob 2 lbs for sup and pronation vertical plane  position this - taped IP of thumb into extention - able to not collapse onto palm this date - 120 sec each  Gripper - 35lbs 120 sec - place and hold 3 sec - placing MC in partial flexion - not compensating as much with pulling this date - graft not as much adhere this date -   Sup and pronation small knob at 0 lbs  -taped IP of thumb to increase extention  120 sec each   Lat grip  this date - 120 sec - increase to 20 lbs  kinesiotape IP into extention - to block from hyper flexion-  2 kg ball on wall with hand in fist - work on stabilization of wrist - at 90 degrees shoulder flexion   3 x 1 min  Estim at end see un attended - protocol at 14 .5 current - 2 x 2 cm thumb thenar eminence - and proximal forearm for Thumb PA                            OT Education - 02/15/17 1429    Education provided Yes   Education Details USing hand - like with springloaded scissors today - wrist extnetion horizontal with 3 lbs -and ball on wall with hand in fist - wrist stabilzation   Person(s) Educated Patient   Methods Explanation;Demonstration;Verbal cues;Tactile cues   Comprehension Verbalized understanding;Verbal cues required;Returned demonstration          OT Short Term Goals - 01/25/17 1047      OT SHORT TERM GOAL #1   Title Pt to be ind in HEP to use  splints correctly , increase ROM and sensatin in R hand and wrist    Status Achieved     OT SHORT TERM GOAL #2   Title Fabricate/ modify or change splints as needed to prevent contractures in R hand and wrist while nerve healing occur   Status Achieved     OT SHORT TERM GOAL #3   Title R wrist AROM and PROM improve with 10-15 degrees to be able carry object on palm, wipe table and reach with wrist extention to grip    Baseline improving but not able to do all act    Time 4   Period Weeks   Status On-going   Target Date 02/22/17     OT SHORT TERM GOAL #4   Title Sensation improve in R hand to identify  ojbects in palm 50% of time    Baseline Semmes Weinstein Radial Nerve normal dorsal and except distal to MP of thumb , 4.56 volar hand except DIP and PIP of 5th , and 4.31 hand except volar 5th    Time 4   Period Weeks   Status On-going   Target Date 03/15/17           OT Long Term Goals - 01/25/17 1049      OT LONG TERM GOAL #1   Title Upgrade HEP to increase PIP and MC flexion as nerve is healing to grasp 6 inch object to 3 inch object   Baseline can use lat grip but not 3 point or full tight grip    Time 4   Period Weeks   Status On-going   Target Date 02/22/17     OT LONG TERM GOAL #2   Title Wrist ROM improve and strength for pt to intiate using 1 lbs weight for wrist in all planes    Baseline using 3 lbs for wrist    Status Achieved     OT LONG TERM GOAL #3   Title Assess shoulder and upgrade HEP to decrease pain and compensation    Status Achieved     OT LONG TERM GOAL #4   Title Pt to use 5 lbs at wrist and elbow without pain or discomfort to use in ADL's and IADL's    Baseline 3 lbs all planes    Time 4  Period Weeks   Status On-going   Target Date 02/15/17               Plan - 02/15/17 1430    Clinical Impression Statement Pt able to use springloaded scissors this date to cut paper in 4 quaters - and less palm on large knob twisting during sup  and pron- able to keep palm more up from tool and use fingers - slow but steady progress    Occupational performance deficits (Please refer to evaluation for details): ADL's;IADL's;Work;Play;Leisure   Rehab Potential Good   OT Frequency 2x / week   OT Duration 4 weeks   OT Treatment/Interventions Self-care/ADL training;Fluidtherapy;Splinting;Patient/family education;Therapeutic exercises;Ultrasound;Scar mobilization;Passive range of motion;Neuromuscular education;Electrical Stimulation;Manual Therapy   Plan cont scar mobs, increase ROM and strength in R hand - and use    Clinical Decision Making Multiple treatment options, significant modification of task necessary   Consulted and Agree with Plan of Care Patient      Patient will benefit from skilled therapeutic intervention in order to improve the following deficits and impairments:  Decreased range of motion, Impaired flexibility, Decreased coordination, Decreased safety awareness, Increased edema, Impaired sensation, Decreased skin integrity, Decreased knowledge of precautions, Decreased knowledge of use of DME, Decreased scar mobility, Impaired UE functional use, Pain, Decreased strength, Impaired perceived functional ability  Visit Diagnosis: Stiffness of right wrist, not elsewhere classified  Pain in right arm  Stiffness of right hand, not elsewhere classified  Other disturbances of skin sensation  Scar condition and fibrosis of skin  Muscle weakness (generalized)    Problem List There are no active problems to display for this patient.   Rosalyn Gess OTR/L,CLT  02/15/2017, 2:33 PM  [facility] [address] [place], [address] Phone: [phone]   Fax:  [phone]  Name: Francisco Johns MRN: [id number] Date of Birth: Dec 02, 1975

## 2017-02-19 ENCOUNTER — Ambulatory Visit: Payer: 59 | Admitting: Occupational Therapy

## 2017-02-19 DIAGNOSIS — M6281 Muscle weakness (generalized): Secondary | ICD-10-CM

## 2017-02-19 DIAGNOSIS — M25641 Stiffness of right hand, not elsewhere classified: Secondary | ICD-10-CM

## 2017-02-19 DIAGNOSIS — L905 Scar conditions and fibrosis of skin: Secondary | ICD-10-CM

## 2017-02-19 DIAGNOSIS — M25631 Stiffness of right wrist, not elsewhere classified: Secondary | ICD-10-CM

## 2017-02-19 DIAGNOSIS — M79601 Pain in right arm: Secondary | ICD-10-CM

## 2017-02-19 DIAGNOSIS — R208 Other disturbances of skin sensation: Secondary | ICD-10-CM

## 2017-02-19 NOTE — Therapy (Signed)
[place] [facility] [address] [place], [address] Phone: [phone]   Fax:  [phone]  Occupational Therapy Treatment  Patient Details  Name: Francisco Johns MRN: [id number] Date of Birth: August 09, 1975 Referring Provider: Vernona Rieger  Encounter Date: 02/19/2017      OT End of Session - 02/19/17 1908    Visit Number 53   Number of Visits 71   Date for OT Re-Evaluation 05/16/17   OT Start Time 1350   OT Stop Time 1507   OT Time Calculation (min) 77 min   Activity Tolerance Patient tolerated treatment well   Behavior During Therapy [facility] for tasks assessed/performed      Past Medical History:  Diagnosis Date  . Metatarsal fracture    right big toe    Past Surgical History:  Procedure Laterality Date  . ankle surgery    . NO PAST SURGERIES    . ORIF TOE FRACTURE Right 12/09/2015   Procedure: OPEN REDUCTION INTERNAL FIXATION (ORIF) METATARSAL (TOE) FRACTURE, first metatarsal;  Surgeon: Renette Butters, MD;  Location: [place];  Service: Orthopedics;  Laterality: Right;    There were no vitals filed for this visit.      Subjective Assessment - 02/19/17 1905    Subjective  I know it is getting better- the range , strength ,scar and use - it just takes time - I can see difference    Patient Stated Goals I want as much movement and use I can get for my R dominant hand- to work , play with my kids, fish , play golf ,   Currently in Pain? No/denies      Scar massage using vibration proximal to graft - and 2 most  Adhere spots with muscle contraction -  on harder fibrotic area - and scar adhesion - with thumb PA and RA  xtrator on scar adhesion proximal 10 x and with flexion of digitsAnd distal on graft 6 times -ROM gripping and thumb PA done this date - no irritation with extractor Done graston tool nr 2 on volar forearm - proximal and lateral to graft   Assess scar and where to apply  kinesiotape Kinesiotape this date applied on 2 areas on graft -that are adhere - with resistance to 3 thru 5th digits, thumb PA and wrist flexion - did 2 at 100 % pull - but 2 cm wide strips this date - star over distal scar and proximal adhesion on graft  Pt aware of precautions   4 lbs for wrist RD, UD , sup and pronation - but need to support during pronation  10reps   BTE 701 at 4 lbs open hand  - place and hold for wrist extention -120sec  x 2 - wrapping hand into fist for one of them at 7 lbs   Tool 502( small) for wrist flexion and extention  not wrap into fist this date - and could do 120 sec -At 2lbs  - had some trouble this date with extention    Large knob 2 lbs for sup and pronation vertical plane position this - taped IP of thumb into extention - able to not collapse onto palm this date again  - 120 sec each  Gripper - 35lbs 120 sec - place and hold 3 sec - placing MC in partial flexion - not compensating as much with pulling this date - graft not as much adhere this date -   Sup and  pronation D ring 7 lbs 120 sec each   Lat grip this date - 120 sec - increase to 20 lbs  kinesiotape IP into extention - to block from hyper flexion-  2 kg ball on wall with hand in fist - work on stabilization of wrist - at 90 degrees shoulder flexion   3 x 1 min   Attempted with small object and 12 inch ball to facilitate thumb PA , RA and opposition  Rely on lat grip with thumb - pt to work on opposition of thumb   initiated this date with 3 inch ball -but made sticky to increase ease and abilty to do - but need place ment and hold 3 sec on tips of thumb and 2nd /3rd  Card can do thumb <> fingers - use damp washcloth to ease traction   Estim at end see un attended - protocol at 14 .5 current - 2 x 2 cm thumb thenar eminence - and proximal forearm for Thumb PA                           OT Education - 02/19/17 1908    Education provided Yes    Education Details Fingers<> thumb  act , increase thumb opposition    Person(s) Educated Patient   Methods Explanation;Demonstration;Tactile cues   Comprehension Verbalized understanding;Returned demonstration          OT Short Term Goals - 01/25/17 1047      OT SHORT TERM GOAL #1   Title Pt to be ind in HEP to use splints correctly , increase ROM and sensatin in R hand and wrist    Status Achieved     OT SHORT TERM GOAL #2   Title Fabricate/ modify or change splints as needed to prevent contractures in R hand and wrist while nerve healing occur   Status Achieved     OT SHORT TERM GOAL #3   Title R wrist AROM and PROM improve with 10-15 degrees to be able carry object on palm, wipe table and reach with wrist extention to grip    Baseline improving but not able to do all act    Time 4   Period Weeks   Status On-going   Target Date 02/22/17     OT SHORT TERM GOAL #4   Title Sensation improve in R hand to identify  ojbects in palm 50% of time    Baseline Semmes Weinstein Radial Nerve normal dorsal and except distal to MP of thumb , 4.56 volar hand except DIP and PIP of 5th , and 4.31 hand except volar 5th    Time 4   Period Weeks   Status On-going   Target Date 03/15/17           OT Long Term Goals - 01/25/17 1049      OT LONG TERM GOAL #1   Title Upgrade HEP to increase PIP and MC flexion as nerve is healing to grasp 6 inch object to 3 inch object   Baseline can use lat grip but not 3 point or full tight grip    Time 4   Period Weeks   Status On-going   Target Date 02/22/17     OT LONG TERM GOAL #2   Title Wrist ROM improve and strength for pt to intiate using 1 lbs weight for wrist in all planes    Baseline using 3 lbs for wrist    Status  Achieved     OT LONG TERM GOAL #3   Title Assess shoulder and upgrade HEP to decrease pain and compensation    Status Achieved     OT LONG TERM GOAL #4   Title Pt to use 5 lbs at wrist and elbow without pain or discomfort  to use in ADL's and IADL's    Baseline 3 lbs all planes    Time 4   Period Weeks   Status On-going   Target Date 02/15/17               Plan - 02/19/17 1909    Clinical Impression Statement Pt cont to show slow but steady progress in scar tissue, able to keep palm off large knob tool and use more fingers - increase strength - able to do active wrist extention with active fist ,  and was able to intiiate this date 3inch ball for fingers <> hand  with some placement in opposition    Occupational performance deficits (Please refer to evaluation for details): ADL's;IADL's;Leisure;Play   Rehab Potential Good   OT Frequency 2x / week   OT Duration 8 weeks   OT Treatment/Interventions Self-care/ADL training;Fluidtherapy;Splinting;Patient/family education;Therapeutic exercises;Ultrasound;Scar mobilization;Passive range of motion;Neuromuscular education;Electrical Stimulation;Manual Therapy   Plan cont scar , ROM , strength - thumb <> digits    Clinical Decision Making Multiple treatment options, significant modification of task necessary   OT Home Exercise Plan see pt instruction   Consulted and Agree with Plan of Care Patient      Patient will benefit from skilled therapeutic intervention in order to improve the following deficits and impairments:  Decreased range of motion, Impaired flexibility, Decreased coordination, Decreased safety awareness, Increased edema, Impaired sensation, Decreased skin integrity, Decreased knowledge of precautions, Decreased knowledge of use of DME, Decreased scar mobility, Impaired UE functional use, Pain, Decreased strength, Impaired perceived functional ability  Visit Diagnosis: Stiffness of right wrist, not elsewhere classified  Pain in right arm  Stiffness of right hand, not elsewhere classified  Other disturbances of skin sensation  Scar condition and fibrosis of skin  Muscle weakness (generalized)    Problem List There are no active  problems to display for this patient.   Rosalyn Gess OTR/L,CLT 02/19/2017, 7:12 PM  [place] [facility] [address], [place], [address] Phone: [phone]   Fax:  [phone]  Name: HUGH KAMARA MRN: [id number] Date of Birth: 1975/07/12

## 2017-02-19 NOTE — Patient Instructions (Signed)
3 inch ball thumb <> fingers - ball sticky  And card attemp fingers <> tumb  To work on thumb opposition

## 2017-02-22 ENCOUNTER — Ambulatory Visit: Payer: 59 | Attending: Plastic Surgery | Admitting: Occupational Therapy

## 2017-02-22 DIAGNOSIS — R208 Other disturbances of skin sensation: Secondary | ICD-10-CM | POA: Insufficient documentation

## 2017-02-22 DIAGNOSIS — M25641 Stiffness of right hand, not elsewhere classified: Secondary | ICD-10-CM | POA: Insufficient documentation

## 2017-02-22 DIAGNOSIS — L905 Scar conditions and fibrosis of skin: Secondary | ICD-10-CM | POA: Diagnosis not present

## 2017-02-22 DIAGNOSIS — M25631 Stiffness of right wrist, not elsewhere classified: Secondary | ICD-10-CM | POA: Diagnosis not present

## 2017-02-22 DIAGNOSIS — M79601 Pain in right arm: Secondary | ICD-10-CM | POA: Insufficient documentation

## 2017-02-22 DIAGNOSIS — M6281 Muscle weakness (generalized): Secondary | ICD-10-CM | POA: Diagnosis not present

## 2017-02-22 NOTE — Patient Instructions (Addendum)
  Attempted 3 inch ball to facilitate thumb PA , RA and opposition  - did well after kinesiotape done to PA [facility] and achor to forearm over graft - able to open thumb more to grasp large objects - pt to use it during day over weekend and provide more functional grasp  3 inch ball  made sticky to increase ease and abilty to do - but need place ment and hold 3 sec on tips of thumb and 2nd /3rd

## 2017-02-22 NOTE — Therapy (Signed)
[place] [facility] [address] [place], [address] Phone: [phone]   Fax:  [phone]  Occupational Therapy Treatment  Patient Details  Name: Francisco Johns MRN: [id number] Date of Birth: 10-07-1975 Referring Provider: Vernona Rieger  Encounter Date: 02/22/2017      OT End of Session - 02/22/17 1126    Visit Number 31   Number of Visits 34   Date for OT Re-Evaluation 05/16/17   OT Start Time 1045   OT Stop Time 1146   OT Time Calculation (min) 61 min   Activity Tolerance Patient tolerated treatment well   Behavior During Therapy [facility] for tasks assessed/performed      Past Medical History:  Diagnosis Date  . Metatarsal fracture    right big toe    Past Surgical History:  Procedure Laterality Date  . ankle surgery    . NO PAST SURGERIES    . ORIF TOE FRACTURE Right 12/09/2015   Procedure: OPEN REDUCTION INTERNAL FIXATION (ORIF) METATARSAL (TOE) FRACTURE, first metatarsal;  Surgeon: Renette Butters, MD;  Location: [place];  Service: Orthopedics;  Laterality: Right;    There were no vitals filed for this visit.      Subjective Assessment - 02/22/17 1124    Subjective  I did the exercises - forgot to bring my webspace in - need some new straps    Patient Stated Goals I want as much movement and use I can get for my R dominant hand- to work , play with my kids, fish , play golf ,   Currently in Pain? No/denies      Scar massage using vibration proximal to graft - and 2 most  Adhere spots with muscle contraction -  on harder fibrotic area - and scar adhesion - with thumb PA and RA  xtrator on scar adhesion proximal 10 x and with flexion of digitsAnd distal on graft 6 times -ROM gripping and thumb PA done this date - no irritation with extractor Done graston tool nr 2 on volar forearm - proximal and lateral to graft   Assess scar and where to apply kinesiotape Kinesiotape this  date applied on 2 areas on graft -that are adhere - with resistance to 3 thru 5th digits, thumb PA and wrist flexion - did 2 at 100 % pull - but 2 cm wide strips this date - star over distal scar and proximal adhesion on graft  Pt aware of precautions   4 lbs for wrist RD, UD , sup and pronation - in between BTE exercises 3 sets of each -but need to support during pronation  10reps   BTE 701 at 2 lbs with hand in active fist - place and hold for wrist extention -120sec  x 2 - Tool 502( small) for wrist flexion and extention not wrap -  120 sec -At 4lbs    Large knob 2lbs for sup and pronation vertical plane position this - taped IP of thumb into extention - able to not collapse onto palm this date again  - 120 sec each  Gripper - 35lbs 120 sec - place and hold 3 sec - placing MC in partial flexion -Work on end range for grip - graft not as much adhere this date -    Lat grip this date - 120 sec - increase to 20 lbs  kinesiotape IP into extention - to block from hyper flexion-  2 kg ball on wall with hand  in fist - work on stabilization of wrist - at 90 degrees shoulder flexion  3 x 1 min   Attempted 3 inch ball to facilitate thumb PA , RA and opposition  - did well after kinesiotape done to PA [facility] and achor to forearm over graft - able to open thumb more to grasp large objects - pt to use it during day over weekend and provide more functional grasp  3 inch ball  made sticky to increase ease and abilty to do - but need place ment and hold 3 sec on tips of thumb and 2nd /3rd                             OT Education - 02/22/17 1126    Education provided Yes   Education Details Taping thumb CMC into PA to provide more functional opposition    Person(s) Educated Patient   Methods Explanation;Demonstration;Tactile cues;Verbal cues   Comprehension Verbal cues required;Returned demonstration;Verbalized understanding          OT Short Term  Goals - 01/25/17 1047      OT SHORT TERM GOAL #1   Title Pt to be ind in HEP to use splints correctly , increase ROM and sensatin in R hand and wrist    Status Achieved     OT SHORT TERM GOAL #2   Title Fabricate/ modify or change splints as needed to prevent contractures in R hand and wrist while nerve healing occur   Status Achieved     OT SHORT TERM GOAL #3   Title R wrist AROM and PROM improve with 10-15 degrees to be able carry object on palm, wipe table and reach with wrist extention to grip    Baseline improving but not able to do all act    Time 4   Period Weeks   Status On-going   Target Date 02/22/17     OT SHORT TERM GOAL #4   Title Sensation improve in R hand to identify  ojbects in palm 50% of time    Baseline Semmes Weinstein Radial Nerve normal dorsal and except distal to MP of thumb , 4.56 volar hand except DIP and PIP of 5th , and 4.31 hand except volar 5th    Time 4   Period Weeks   Status On-going   Target Date 03/15/17           OT Long Term Goals - 01/25/17 1049      OT LONG TERM GOAL #1   Title Upgrade HEP to increase PIP and MC flexion as nerve is healing to grasp 6 inch object to 3 inch object   Baseline can use lat grip but not 3 point or full tight grip    Time 4   Period Weeks   Status On-going   Target Date 02/22/17     OT LONG TERM GOAL #2   Title Wrist ROM improve and strength for pt to intiate using 1 lbs weight for wrist in all planes    Baseline using 3 lbs for wrist    Status Achieved     OT LONG TERM GOAL #3   Title Assess shoulder and upgrade HEP to decrease pain and compensation    Status Achieved     OT LONG TERM GOAL #4   Title Pt to use 5 lbs at wrist and elbow without pain or discomfort to use in ADL's and IADL's  Baseline 3 lbs all planes    Time 4   Period Weeks   Status On-going   Target Date 02/15/17               Plan - 02/22/17 1130    Clinical Impression Statement Pt ths date was able to oppose and  grasp 3 inch object with more ease after taping thumb CMC into PA and achor to forearm - pt hs more MP and IP of thumb AROM than CMC - cont to show slow but steady progress in scar , wrist extention -    Occupational performance deficits (Please refer to evaluation for details): ADL's;IADL's;Leisure;Play   Rehab Potential Good   OT Frequency 2x / week   OT Duration 6 weeks   OT Treatment/Interventions Self-care/ADL training;Fluidtherapy;Splinting;Patient/family education;Therapeutic exercises;Ultrasound;Scar mobilization;Passive range of motion;Neuromuscular education;Electrical Stimulation;Manual Therapy   Plan facilitate increase thumb opposition    Clinical Decision Making Multiple treatment options, significant modification of task necessary   OT Home Exercise Plan see pt instruction   Consulted and Agree with Plan of Care Patient      Patient will benefit from skilled therapeutic intervention in order to improve the following deficits and impairments:  Decreased range of motion, Impaired flexibility, Decreased coordination, Decreased safety awareness, Increased edema, Impaired sensation, Decreased skin integrity, Decreased knowledge of precautions, Decreased knowledge of use of DME, Decreased scar mobility, Impaired UE functional use, Pain, Decreased strength, Impaired perceived functional ability  Visit Diagnosis: Stiffness of right wrist, not elsewhere classified  Pain in right arm  Stiffness of right hand, not elsewhere classified  Other disturbances of skin sensation  Scar condition and fibrosis of skin  Muscle weakness (generalized)    Problem List There are no active problems to display for this patient.   Rosalyn Gess OTR/L,CLT 02/22/2017, 3:05 PM  [place] [facility] [address], [place], [address] Phone: [phone]   Fax:  [phone]  Name: ASTON LIESKE MRN: [id number] Date of Birth:  Aug 25, 1975

## 2017-02-25 ENCOUNTER — Ambulatory Visit: Payer: 59 | Admitting: Occupational Therapy

## 2017-02-25 DIAGNOSIS — R208 Other disturbances of skin sensation: Secondary | ICD-10-CM | POA: Diagnosis not present

## 2017-02-25 DIAGNOSIS — M79601 Pain in right arm: Secondary | ICD-10-CM

## 2017-02-25 DIAGNOSIS — M25631 Stiffness of right wrist, not elsewhere classified: Secondary | ICD-10-CM | POA: Diagnosis not present

## 2017-02-25 DIAGNOSIS — M6281 Muscle weakness (generalized): Secondary | ICD-10-CM

## 2017-02-25 DIAGNOSIS — L905 Scar conditions and fibrosis of skin: Secondary | ICD-10-CM

## 2017-02-25 DIAGNOSIS — M25641 Stiffness of right hand, not elsewhere classified: Secondary | ICD-10-CM | POA: Diagnosis not present

## 2017-02-25 NOTE — Therapy (Signed)
[place] [facility] [address], [place], [address] Phone: [phone]   Fax:  [phone]  Occupational Therapy Treatment  Patient Details  Name: Francisco Johns MRN: [id number] Date of Birth: 02/21/1976 Referring Provider: Vernona Rieger   Encounter Date: 02/25/2017  OT End of Session - 02/25/17 1013    Visit Number  55    Number of Visits  4    Date for OT Re-Evaluation  05/16/17    OT Start Time  1009    OT Stop Time  1130    OT Time Calculation (min)  81 min    Activity Tolerance  Patient tolerated treatment well    Behavior During Therapy  [facility] for tasks assessed/performed       Past Medical History:  Diagnosis Date  . Metatarsal fracture    right big toe    Past Surgical History:  Procedure Laterality Date  . ankle surgery    . NO PAST SURGERIES      There were no vitals filed for this visit.  Subjective Assessment - 02/25/17 1012    Subjective   I did tape my thumb -and tried to use it more - pick up objects - try to use it in driving little    Patient Stated Goals  I want as much movement and use I can get for my R dominant hand- to work , play with my kids, fish , play golf ,    Currently in Pain?  No/denies        Scar massage using vibration proximal to graft - and 2 most Adhere spots with muscle contraction - on harder fibrotic area - and scar adhesion - with thumb PA and RA  xtrator on scar adhesion proximal 10 x and with flexion of digitsAnd distal on graft 6 times -ROM gripping and thumb PA done this date - no irritation with extractor Done graston tool nr 2 on volar forearm - proximal and lateral to graft   Assess scar and where to apply kinesiotape Kinesiotape this date applied on 2 areas on graft -that are adhere - with resistance to 3 thru 5th digits, thumb PA and wrist flexion - did 2 at 100 % pull - but 2 cm wide strips this date - star over distal scar and proximal adhesion  on graft  Pt aware of precautions   4lbs for wrist RD, UD , sup and pronation - in between BTE exercises 3 sets of each -but need to support during pronation  10reps   BTE 701 at 2 lbs with hand in active fist - place and hold for wrist extention -120sec  x 2 - Tool 502( small) for wrist flexion and extention not wrap -  120 sec -At 4lbs    Large knob 2lbs for sup and pronation vertical plane position this - taped IP of thumb into extention - able to not collapse onto palm this date again - 120 sec each  Gripper - 35lbs 120 sec - place and hold 3 sec - placing MC in partial flexion -Work on end range for grip - graft not as much adhere this date -    Lat grip this date - 120 sec - increase to 20 lbs  kinesiotape IP into extention - to block from hyper flexion-  Basket ball on  wall with fingertips - with IP of thumb taped in extention and CMC into opposition -collapsing and thumb fatigue after smaller object faciliation  3 x 1 min   Attempted 3 inch ball to facilitate thumb PA , RA and opposition  - did well after kinesiotape done to PA [facility] and achor to forearm over graft - able to open thumb more to grasp large objects - pt to use it during day over weekend and provide more functional grasp Pick up 2 inch by 2 inch cubes - stack with 2 point pinch but not 3 point - 6  Able to do 2 times  unstack 3 point -  But only top 2 -     3 inch ball  made sticky to increase ease and abilty to do - but need place ment and hold 3 sec on tips of thumb and 2nd /3rd  Estim done reed - for thumb RA - on 17 current - protocol for muscle reed for hand  On thumb  And proximal forearm  8 min                        OT Education - 02/25/17 1013    Education provided  Yes    Education Details  Taping again for [facility] oppposition -to increase use of thumb     Person(s) Educated  Patient    Methods  Explanation;Demonstration;Tactile cues    Comprehension   Verbalized understanding;Need further instruction;Returned demonstration;Verbal cues required       OT Short Term Goals - 01/25/17 1047      OT SHORT TERM GOAL #1   Title  Pt to be ind in HEP to use splints correctly , increase ROM and sensatin in R hand and wrist     Status  Achieved      OT SHORT TERM GOAL #2   Title  Fabricate/ modify or change splints as needed to prevent contractures in R hand and wrist while nerve healing occur    Status  Achieved      OT SHORT TERM GOAL #3   Title  R wrist AROM and PROM improve with 10-15 degrees to be able carry object on palm, wipe table and reach with wrist extention to grip     Baseline  improving but not able to do all act     Time  4    Period  Weeks    Status  On-going    Target Date  02/22/17      OT SHORT TERM GOAL #4   Title  Sensation improve in R hand to identify  ojbects in palm 50% of time     Baseline  Semmes Weinstein Radial Nerve normal dorsal and except distal to MP of thumb , 4.56 volar hand except DIP and PIP of 5th , and 4.31 hand except volar 5th     Time  4    Period  Weeks    Status  On-going    Target Date  03/15/17        OT Long Term Goals - 01/25/17 1049      OT LONG TERM GOAL #1   Title  Upgrade HEP to increase PIP and MC flexion as nerve is healing to grasp 6 inch object to 3 inch object    Baseline  can use lat grip but not 3 point or full tight grip     Time  4    Period  Weeks    Status  On-going    Target Date  02/22/17      OT LONG TERM GOAL #2  Title  Wrist ROM improve and strength for pt to intiate using 1 lbs weight for wrist in all planes     Baseline  using 3 lbs for wrist     Status  Achieved      OT LONG TERM GOAL #3   Title  Assess shoulder and upgrade HEP to decrease pain and compensation     Status  Achieved      OT LONG TERM GOAL #4   Title  Pt to use 5 lbs at wrist and elbow without pain or discomfort to use in ADL's and IADL's     Baseline  3 lbs all planes     Time  4     Period  Weeks    Status  On-going    Target Date  02/15/17            Plan - 02/25/17 1014    Clinical Impression Statement  Pt show increase thumb control but fatigue - scar still adhere for 4th and 5th digit, extention - and wrist ROM - cont to decrease scar tissue and increase ROM /strength as nerve heal     Occupational performance deficits (Please refer to evaluation for details):  ADL's;IADL's;Work;Play;Leisure    Rehab Potential  Good    OT Frequency  2x / week    OT Duration  6 weeks    OT Treatment/Interventions  Self-care/ADL training;Fluidtherapy;Splinting;Patient/family education;Therapeutic exercises;Ultrasound;Scar mobilization;Passive range of motion;Neuromuscular education;Electrical Stimulation;Manual Therapy    Plan  cont to facilitae thumb Opposition - function - decrease scar tissue     Clinical Decision Making  Multiple treatment options, significant modification of task necessary    OT Home Exercise Plan  see pt instruction    Consulted and Agree with Plan of Care  Patient       Patient will benefit from skilled therapeutic intervention in order to improve the following deficits and impairments:  Decreased range of motion, Impaired flexibility, Decreased coordination, Decreased safety awareness, Increased edema, Impaired sensation, Decreased skin integrity, Decreased knowledge of precautions, Decreased knowledge of use of DME, Decreased scar mobility, Impaired UE functional use, Pain, Decreased strength, Impaired perceived functional ability  Visit Diagnosis: Stiffness of right wrist, not elsewhere classified  Pain in right arm  Stiffness of right hand, not elsewhere classified  Other disturbances of skin sensation  Scar condition and fibrosis of skin  Muscle weakness (generalized)    Problem List There are no active problems to display for this patient.   Rosalyn Gess OTR/L,CLT 02/25/2017, 11:48 AM  [place] [facility] [address], [place], [address] Phone: [phone]   Fax:  [phone]  Name: Francisco Johns MRN: [id number] Date of Birth: 02/10/1976

## 2017-02-25 NOTE — Patient Instructions (Signed)
Tape thumb CMC into opposition - and use for large object palm to 3 point grip  2 inch by 2 inch objects - pick up and stag

## 2017-03-01 ENCOUNTER — Ambulatory Visit: Payer: 59 | Admitting: Occupational Therapy

## 2017-03-01 DIAGNOSIS — L905 Scar conditions and fibrosis of skin: Secondary | ICD-10-CM | POA: Diagnosis not present

## 2017-03-01 DIAGNOSIS — R208 Other disturbances of skin sensation: Secondary | ICD-10-CM | POA: Diagnosis not present

## 2017-03-01 DIAGNOSIS — M6281 Muscle weakness (generalized): Secondary | ICD-10-CM

## 2017-03-01 DIAGNOSIS — M25641 Stiffness of right hand, not elsewhere classified: Secondary | ICD-10-CM | POA: Diagnosis not present

## 2017-03-01 DIAGNOSIS — M79601 Pain in right arm: Secondary | ICD-10-CM | POA: Diagnosis not present

## 2017-03-01 DIAGNOSIS — M25631 Stiffness of right wrist, not elsewhere classified: Secondary | ICD-10-CM | POA: Diagnosis not present

## 2017-03-01 NOTE — Therapy (Signed)
[facility] [address] [place], [address] Phone: [phone]   Fax:  [phone]  Occupational Therapy Treatment  Patient Details  Name: Francisco Johns MRN: [id number] Date of Birth: Aug 10, 1975 Referring Provider: Vernona Rieger   Encounter Date: 03/01/2017  OT End of Session - 03/01/17 1445    Visit Number  56    Number of Visits  22    Date for OT Re-Evaluation  05/16/17    OT Start Time  0924    OT Stop Time  1040    OT Time Calculation (min)  76 min    Activity Tolerance  Patient tolerated treatment well    Behavior During Therapy  [facility] for tasks assessed/performed       Past Medical History:  Diagnosis Date  . Metatarsal fracture    right big toe    Past Surgical History:  Procedure Laterality Date  . ankle surgery    . NO PAST SURGERIES      There were no vitals filed for this visit.  Subjective Assessment - 03/01/17 1441    Patient Stated Goals  I want as much movement and use I can get for my R dominant hand- to work , play with my kids, fish , play golf ,    Currently in Pain?  No/denies       Scar massage using vibration proximal to graft - and 2 most Adhere spots with muscle contraction - on harder fibrotic area - and scar adhesion - with thumb PA and RA  xtrator on scar adhesion proximal 10 x and with flexion of digitsAnd distal on graft 6 times -ROM gripping and thumb PA done this date - no irritation with extractor Done graston tool nr 2 on volar forearm - proximal and lateral to graft   Assess scar and where to apply kinesiotape Kinesiotape this date applied on 2 areas on graft -that are adhere - with resistance to 3 thru 5th digits, thumb PA and wrist flexion - did 2 at 100 % pull - but 2 cm wide strips this date - star over distal scar and proximal adhesion on graft  Pt aware of precautions   4lbs for wrist RD, UD , sup and pronation - i 3 sets of each -but need to  support during pronation  10reps   Tool 502( small) for wrist flexion and extention - grip with ulnar 3 digit and coban strap  5th and 4th into flexion  - wrist extention and flexion  -120sec each  And 1 lbs    Large knob 2lbs for sup and pronation vertical plane position this - taped  thumb into Opposition  - able to not collapse onto palm this date again - 120 sec each  Gripper - 35lbs 120 sec - place and hold 3 sec - flexion strap 5th and 4th into flexion  -Work on end range for grip - graft not as much adhere this date -    Lat grip this date - 120 sec - increase to 15  lbs   Basket ball on  wall with fingertips - with thumb taped in Oppostion  -3 x 1 min   Tool 304 gripping -thumb into extnetion - sup and pronation  120 sec 3 lbs each      Estim done reed - for thumb RA - on 17 current - protocol for muscle reed for hand  On thumb  And proximal forearm  8  min                       OT Education - 03/01/17 1443    Education provided  Yes    Education Details  Using thumb with grasping - and taping thumb to faciltate Opposition     Person(s) Educated  Patient    Methods  Explanation;Demonstration;Tactile cues    Comprehension  Verbalized understanding;Need further instruction;Returned demonstration       OT Short Term Goals - 01/25/17 1047      OT SHORT TERM GOAL #1   Title  Pt to be ind in HEP to use splints correctly , increase ROM and sensatin in R hand and wrist     Status  Achieved      OT SHORT TERM GOAL #2   Title  Fabricate/ modify or change splints as needed to prevent contractures in R hand and wrist while nerve healing occur    Status  Achieved      OT SHORT TERM GOAL #3   Title  R wrist AROM and PROM improve with 10-15 degrees to be able carry object on palm, wipe table and reach with wrist extention to grip     Baseline  improving but not able to do all act     Time  4    Period  Weeks    Status  On-going     Target Date  02/22/17      OT SHORT TERM GOAL #4   Title  Sensation improve in R hand to identify  ojbects in palm 50% of time     Baseline  Semmes Weinstein Radial Nerve normal dorsal and except distal to MP of thumb , 4.56 volar hand except DIP and PIP of 5th , and 4.31 hand except volar 5th     Time  4    Period  Weeks    Status  On-going    Target Date  03/15/17        OT Long Term Goals - 01/25/17 1049      OT LONG TERM GOAL #1   Title  Upgrade HEP to increase PIP and MC flexion as nerve is healing to grasp 6 inch object to 3 inch object    Baseline  can use lat grip but not 3 point or full tight grip     Time  4    Period  Weeks    Status  On-going    Target Date  02/22/17      OT LONG TERM GOAL #2   Title  Wrist ROM improve and strength for pt to intiate using 1 lbs weight for wrist in all planes     Baseline  using 3 lbs for wrist     Status  Achieved      OT LONG TERM GOAL #3   Title  Assess shoulder and upgrade HEP to decrease pain and compensation     Status  Achieved      OT LONG TERM GOAL #4   Title  Pt to use 5 lbs at wrist and elbow without pain or discomfort to use in ADL's and IADL's     Baseline  3 lbs all planes     Time  4    Period  Weeks    Status  On-going    Target Date  02/15/17            Plan - 03/01/17 1449  Clinical Impression Statement  Pt show improve sensation -on Semmes Weinstein  on ulnar side of hand to DIP of 5th 4.31- increase thumb mobility to stabilize     Occupational performance deficits (Please refer to evaluation for details):  ADL's;IADL's;Work;Play;Leisure    Rehab Potential  Good    OT Frequency  2x / week    OT Duration  6 weeks    OT Treatment/Interventions  Self-care/ADL training;Fluidtherapy;Splinting;Patient/family education;Therapeutic exercises;Ultrasound;Scar mobilization;Passive range of motion;Neuromuscular education;Electrical Stimulation;Manual Therapy    Plan  assess progress  and upgrade HEP      Clinical Decision Making  Multiple treatment options, significant modification of task necessary    OT Home Exercise Plan  see pt instruction    Consulted and Agree with Plan of Care  Patient       Patient will benefit from skilled therapeutic intervention in order to improve the following deficits and impairments:  Decreased range of motion, Impaired flexibility, Decreased coordination, Decreased safety awareness, Increased edema, Impaired sensation, Decreased skin integrity, Decreased knowledge of precautions, Decreased knowledge of use of DME, Decreased scar mobility, Impaired UE functional use, Pain, Decreased strength, Impaired perceived functional ability  Visit Diagnosis: Stiffness of right wrist, not elsewhere classified  Pain in right arm  Stiffness of right hand, not elsewhere classified  Other disturbances of skin sensation  Scar condition and fibrosis of skin  Muscle weakness (generalized)    Problem List There are no active problems to display for this patient.   Rosalyn Gess OTR/L,CLT 03/01/2017, 2:56 PM  [facility] [address], [place], [address] Phone: [phone]   Fax:  [phone]  Name: Francisco Johns MRN: [id number] Date of Birth: 1975-07-07

## 2017-03-05 ENCOUNTER — Ambulatory Visit: Payer: 59 | Admitting: Occupational Therapy

## 2017-03-05 DIAGNOSIS — R208 Other disturbances of skin sensation: Secondary | ICD-10-CM | POA: Diagnosis not present

## 2017-03-05 DIAGNOSIS — L905 Scar conditions and fibrosis of skin: Secondary | ICD-10-CM | POA: Diagnosis not present

## 2017-03-05 DIAGNOSIS — M25631 Stiffness of right wrist, not elsewhere classified: Secondary | ICD-10-CM

## 2017-03-05 DIAGNOSIS — M25641 Stiffness of right hand, not elsewhere classified: Secondary | ICD-10-CM

## 2017-03-05 DIAGNOSIS — M6281 Muscle weakness (generalized): Secondary | ICD-10-CM | POA: Diagnosis not present

## 2017-03-05 DIAGNOSIS — M79601 Pain in right arm: Secondary | ICD-10-CM

## 2017-03-05 NOTE — Therapy (Signed)
[facility] [address] [place], [address] Phone: [phone]   Fax:  [phone]  Occupational Therapy Treatment  Patient Details  Name: Francisco Johns MRN: [id number] Date of Birth: [birth year] Referring Provider: Vernona Rieger   Encounter Date: 03/05/2017  OT End of Session - 03/05/17 0948    Visit Number  44    Number of Visits  71    Date for OT Re-Evaluation  05/16/17    OT Start Time  0918    OT Stop Time  1025    OT Time Calculation (min)  67 min    Activity Tolerance  Patient tolerated treatment well    Behavior During Therapy  [facility] for tasks assessed/performed       Past Medical History:  Diagnosis Date  . Metatarsal fracture    right big toe    Past Surgical History:  Procedure Laterality Date  . ankle surgery    . NO PAST SURGERIES      There were no vitals filed for this visit.  Subjective Assessment - 03/05/17 0943    Subjective   Doing okay - cannot do 5 lbs - to much pull on my wrist- but 4 lbs okay - trying to use it more like in dressing - trying to cut up onion    Patient Stated Goals  I want as much movement and use I can get for my R dominant hand- to work , play with my kids, fish , play golf ,    Currently in Pain?  No/denies          Scar massage using vibration proximal to graft - and 2 most Adhere spots with muscle contraction - on harder fibrotic area - and scar adhesion - with thumb PA and RA  xtrator on scar adhesion proximal 10 x and with flexion of digitsAnd distal on graft 6 times -ROM gripping and thumb PA done this date - no irritation with extractor Done graston tool nr 2 on volar forearm - proximal and lateral to graft   Assess scar and where to apply kinesiotape Kinesiotape this date applied on 2 areas on graft -that are adhere - with resistance to 3 thru 5th digits, thumb PA and wrist flexion - did 2 at 100 % pull - but 2 cm wide strips this date -  star over distal scar and proximal adhesion on graft  Pt aware of precautions   4lbs for wrist RD, UD , sup and pronation - i 3 sets of each -but need to support during pronation  10reps   Tool 502( small) for wrist flexion and extention - grip with ulnar 3 digit and coban strap  5th and 4th into flexion  - wrist extention and flexion  -120sec each  And 1 lbs    Large knob 2lbs for sup and pronation vertical plane position this - taped  thumb into Opposition  - able to not collapse onto palm this date again - 120 sec each  Gripper - 35lbs 120 sec - place and hold 3 sec - flexion strap 5th and 4th into flexion  -Work on end range for grip - graft not as much adhere this date -   Small  Knob able to do 0 lbs supination - 120 sec  but unable to do pronation - thumb not able to maintain  And do PA   Lat grip this date - 120 sec - increase to 18  lbs this date  Fingertips on wall with texture  - with thumb taped in Oppostion -3 x 1 min keeping palm away from collapsing ad working on active arch  Tool 304 gripping -thumb into extention - sup and pronation  120 sec 3 lbs each     Estim done reed - for thumb RA - on 17 current - protocol for muscle reed for hand  On thumb And proximal forearm  8 min                   OT Education - 03/05/17 0947    Education provided  Yes    Education Details  working on arching palm - using FM - thumb     Person(s) Educated  Patient    Methods  Demonstration;Tactile cues    Comprehension  Verbalized understanding       OT Short Term Goals - 01/25/17 1047      OT SHORT TERM GOAL #1   Title  Pt to be ind in HEP to use splints correctly , increase ROM and sensatin in R hand and wrist     Status  Achieved      OT SHORT TERM GOAL #2   Title  Fabricate/ modify or change splints as needed to prevent contractures in R hand and wrist while nerve healing occur    Status  Achieved      OT SHORT TERM  GOAL #3   Title  R wrist AROM and PROM improve with 10-15 degrees to be able carry object on palm, wipe table and reach with wrist extention to grip     Baseline  improving but not able to do all act     Time  4    Period  Weeks    Status  On-going    Target Date  02/22/17      OT SHORT TERM GOAL #4   Title  Sensation improve in R hand to identify  ojbects in palm 50% of time     Baseline  Semmes Weinstein Radial Nerve normal dorsal and except distal to MP of thumb , 4.56 volar hand except DIP and PIP of 5th , and 4.31 hand except volar 5th     Time  4    Period  Weeks    Status  On-going    Target Date  03/15/17        OT Long Term Goals - 01/25/17 1049      OT LONG TERM GOAL #1   Title  Upgrade HEP to increase PIP and MC flexion as nerve is healing to grasp 6 inch object to 3 inch object    Baseline  can use lat grip but not 3 point or full tight grip     Time  4    Period  Weeks    Status  On-going    Target Date  02/22/17      OT LONG TERM GOAL #2   Title  Wrist ROM improve and strength for pt to intiate using 1 lbs weight for wrist in all planes     Baseline  using 3 lbs for wrist     Status  Achieved      OT LONG TERM GOAL #3   Title  Assess shoulder and upgrade HEP to decrease pain and compensation     Status  Achieved      OT LONG TERM GOAL #4   Title  Pt to use 5 lbs  at wrist and elbow without pain or discomfort to use in ADL's and IADL's     Baseline  3 lbs all planes     Time  4    Period  Weeks    Status  On-going    Target Date  02/15/17            Plan - 03/05/17 0951    Clinical Impression Statement  Pt cont to make progress in strength and AROM - decrease scar and facilitate increase ROM as nerve healing occur- pt attempting to use more     Occupational performance deficits (Please refer to evaluation for details):  ADL's;IADL's;Work;Play;Leisure    Rehab Potential  Good    OT Frequency  2x / week    OT Duration  6 weeks    OT  Treatment/Interventions  Self-care/ADL training;Fluidtherapy;Splinting;Patient/family education;Therapeutic exercises;Ultrasound;Scar mobilization;Passive range of motion;Neuromuscular education;Electrical Stimulation;Manual Therapy    Plan  upgrade HEP as needed     Clinical Decision Making  Multiple treatment options, significant modification of task necessary    OT Home Exercise Plan  see pt instruction    Consulted and Agree with Plan of Care  Patient       Patient will benefit from skilled therapeutic intervention in order to improve the following deficits and impairments:  Decreased range of motion, Impaired flexibility, Decreased coordination, Decreased safety awareness, Increased edema, Impaired sensation, Decreased skin integrity, Decreased knowledge of precautions, Decreased knowledge of use of DME, Decreased scar mobility, Impaired UE functional use, Pain, Decreased strength, Impaired perceived functional ability  Visit Diagnosis: Stiffness of right wrist, not elsewhere classified  Pain in right arm  Stiffness of right hand, not elsewhere classified  Other disturbances of skin sensation  Scar condition and fibrosis of skin  Muscle weakness (generalized)    Problem List There are no active problems to display for this patient.   Rosalyn Gess OTR/L,CLT 03/05/2017, 11:14 AM  [facility] [address], [place], [address] Phone: [phone]   Fax:  [phone]  Name: Francisco Johns MRN: [id number] Date of Birth: Jan 04, 1976

## 2017-03-05 NOTE — Patient Instructions (Addendum)
Same HEP  

## 2017-03-07 ENCOUNTER — Ambulatory Visit: Payer: 59 | Admitting: Occupational Therapy

## 2017-03-07 DIAGNOSIS — M25641 Stiffness of right hand, not elsewhere classified: Secondary | ICD-10-CM

## 2017-03-07 DIAGNOSIS — M79601 Pain in right arm: Secondary | ICD-10-CM | POA: Diagnosis not present

## 2017-03-07 DIAGNOSIS — L905 Scar conditions and fibrosis of skin: Secondary | ICD-10-CM

## 2017-03-07 DIAGNOSIS — R208 Other disturbances of skin sensation: Secondary | ICD-10-CM | POA: Diagnosis not present

## 2017-03-07 DIAGNOSIS — M6281 Muscle weakness (generalized): Secondary | ICD-10-CM

## 2017-03-07 DIAGNOSIS — M25631 Stiffness of right wrist, not elsewhere classified: Secondary | ICD-10-CM | POA: Diagnosis not present

## 2017-03-07 NOTE — Therapy (Signed)
[facility] [address], [place], [address] Phone: [phone]   Fax:  [phone]  Occupational Therapy Treatment  Patient Details  Name: Francisco Johns MRN: [id number] Date of Birth: 18-Jul-1975 Referring Provider: Vernona Rieger   Encounter Date: 03/07/2017  OT End of Session - 03/07/17 1355    Visit Number  32    Number of Visits  71    Date for OT Re-Evaluation  05/16/17    OT Start Time  1025    OT Stop Time  1135    OT Time Calculation (min)  70 min    Activity Tolerance  Patient tolerated treatment well    Behavior During Therapy  [facility] for tasks assessed/performed       Past Medical History:  Diagnosis Date  . Metatarsal fracture    right big toe    Past Surgical History:  Procedure Laterality Date  . ankle surgery    . NO PAST SURGERIES    . ORIF TOE FRACTURE Right 12/09/2015   Procedure: OPEN REDUCTION INTERNAL FIXATION (ORIF) METATARSAL (TOE) FRACTURE, first metatarsal;  Surgeon: Renette Butters, MD;  Location: [facility];  Service: Orthopedics;  Laterality: Right;    There were no vitals filed for this visit.  Subjective Assessment - 03/07/17 1353    Subjective   Doing okay - I would be so happy if that pinkie wants to bend     Patient Stated Goals  I want as much movement and use I can get for my R dominant hand- to work , play with my kids, fish , play golf ,    Currently in Pain?  No/denies       Scar massage using vibration proximal to graft - and 2 most Adhere spots with muscle contraction - on harder fibrotic area - and scar adhesion - with thumb PA and RA  xtrator on scar adhesion proximal 10 x and  Wrist in extention and digits in extention and flexion And distal on graft 6 times -ROM gripping and thumb PA done this date - no irritation with extractor Done graston tool nr 2 on volar forearm - proximal and lateral to graft   Assess scar and where to apply  kinesiotape Kinesiotape this date applied on 2 areas on graft -that are adhere - with resistance to 3 thru 5th digits, thumb PA and wrist flexion - did 2 at 100 % pull - but 2 cm wide strips this date - star over distal scar and proximal adhesion on graft  Pt aware of precautions   Assess 3 lbs able to do place and hold active fist  But not with 4 or  5 lbs  But with wrap hand in fist around 4lbs  Can do place and hold wrist extention , for wrist RD, UD , sup and pronation - 8 repseach  -but need to support during pronation  10reps  And 5 lbs with fist wrap - could do sup and pro - pick up midway - rest and end positions   Tool 502( small) for wrist flexion and extention - grip with ulnar 3 digit and coban strap 5th and 4th into flexion - wrist extention and flexion-120sec each  And 1 lbs   Large knob 2lbs for sup and pronation vertical plane position this - taped thumb into extention for IP   Gripper - 15lbs 120 sec -  flexion strap 5th and 4th into flexion-Work on end  range for grip - graft not as much adhere this date -    Fabricated hand base dorsal splint to block MC of 5th in extention and allow PIP flexion - to about 80 degrees - can activite it and around smaller cylinder grip - to use with steering wheel , broom and ect during day for short periods - knowledge on precautions                          OT Education - 03/07/17 1355    Education provided  Yes    Education Details  PIP flexion splint wearing     Person(s) Educated  Patient    Methods  Explanation;Demonstration    Comprehension  Verbalized understanding;Returned demonstration       OT Short Term Goals - 01/25/17 1047      OT SHORT TERM GOAL #1   Title  Pt to be ind in HEP to use splints correctly , increase ROM and sensatin in R hand and wrist     Status  Achieved      OT SHORT TERM GOAL #2   Title  Fabricate/ modify or change splints as needed to  prevent contractures in R hand and wrist while nerve healing occur    Status  Achieved      OT SHORT TERM GOAL #3   Title  R wrist AROM and PROM improve with 10-15 degrees to be able carry object on palm, wipe table and reach with wrist extention to grip     Baseline  improving but not able to do all act     Time  4    Period  Weeks    Status  On-going    Target Date  02/22/17      OT SHORT TERM GOAL #4   Title  Sensation improve in R hand to identify  ojbects in palm 50% of time     Baseline  Semmes Weinstein Radial Nerve normal dorsal and except distal to MP of thumb , 4.56 volar hand except DIP and PIP of 5th , and 4.31 hand except volar 5th     Time  4    Period  Weeks    Status  On-going    Target Date  03/15/17        OT Long Term Goals - 01/25/17 1049      OT LONG TERM GOAL #1   Title  Upgrade HEP to increase PIP and MC flexion as nerve is healing to grasp 6 inch object to 3 inch object    Baseline  can use lat grip but not 3 point or full tight grip     Time  4    Period  Weeks    Status  On-going    Target Date  02/22/17      OT LONG TERM GOAL #2   Title  Wrist ROM improve and strength for pt to intiate using 1 lbs weight for wrist in all planes     Baseline  using 3 lbs for wrist     Status  Achieved      OT LONG TERM GOAL #3   Title  Assess shoulder and upgrade HEP to decrease pain and compensation     Status  Achieved      OT LONG TERM GOAL #4   Title  Pt to use 5 lbs at wrist and elbow without pain or discomfort to use in ADL's  and IADL's     Baseline  3 lbs all planes     Time  4    Period  Weeks    Status  On-going    Target Date  02/15/17            Plan - 03/07/17 1356    Clinical Impression Statement  Pt show increase wrist strength - but scar tissue with gripping it work against wrist extentio n- pt to wrap hand on 4 and 5 lbs to work on wrist strength - and did hand base MC block splint on 5th to faciiliate active flexion of 5thPIP to 80  degrees to wear several times during day     Occupational performance deficits (Please refer to evaluation for details):  ADL's;IADL's;Work;Play;Leisure    Rehab Potential  Good    OT Frequency  2x / week    OT Duration  6 weeks    OT Treatment/Interventions  Self-care/ADL training;Fluidtherapy;Splinting;Patient/family education;Therapeutic exercises;Ultrasound;Scar mobilization;Passive range of motion;Neuromuscular education;Electrical Stimulation;Manual Therapy    Clinical Decision Making  Multiple treatment options, significant modification of task necessary    OT Home Exercise Plan  see pt instruction    Consulted and Agree with Plan of Care  Patient       Patient will benefit from skilled therapeutic intervention in order to improve the following deficits and impairments:  Decreased range of motion, Impaired flexibility, Decreased coordination, Decreased safety awareness, Increased edema, Impaired sensation, Decreased skin integrity, Decreased knowledge of precautions, Decreased knowledge of use of DME, Decreased scar mobility, Impaired UE functional use, Pain, Decreased strength, Impaired perceived functional ability  Visit Diagnosis: Stiffness of right wrist, not elsewhere classified  Pain in right arm  Stiffness of right hand, not elsewhere classified  Other disturbances of skin sensation  Scar condition and fibrosis of skin  Muscle weakness (generalized)    Problem List There are no active problems to display for this patient.   Rosalyn Gess OTR/L,CLT 03/07/2017, 1:59 PM  [facility] [address] [place], [address] Phone: [phone]   Fax:  [phone]  Name: COLBI STAUBS MRN: [id number] Date of Birth: 28-Dec-1975

## 2017-03-07 NOTE — Patient Instructions (Signed)
Use PIP flexion hand base splint - with MC block for 5th digit - on and off during day for 30 min at time - aware of precautions - redness  Use 4 and 5 lbs weight for wrist at home - but wrap hand in fist

## 2017-03-12 ENCOUNTER — Ambulatory Visit: Payer: 59 | Admitting: Occupational Therapy

## 2017-03-12 DIAGNOSIS — M6281 Muscle weakness (generalized): Secondary | ICD-10-CM

## 2017-03-12 DIAGNOSIS — M79601 Pain in right arm: Secondary | ICD-10-CM | POA: Diagnosis not present

## 2017-03-12 DIAGNOSIS — L905 Scar conditions and fibrosis of skin: Secondary | ICD-10-CM | POA: Diagnosis not present

## 2017-03-12 DIAGNOSIS — R208 Other disturbances of skin sensation: Secondary | ICD-10-CM | POA: Diagnosis not present

## 2017-03-12 DIAGNOSIS — M25631 Stiffness of right wrist, not elsewhere classified: Secondary | ICD-10-CM | POA: Diagnosis not present

## 2017-03-12 DIAGNOSIS — M25641 Stiffness of right hand, not elsewhere classified: Secondary | ICD-10-CM | POA: Diagnosis not present

## 2017-03-12 NOTE — Patient Instructions (Signed)
Same - but only wrap 4thand 5th in flexion - during doing 4 lbs weight for wrist  And do some turning using finger tips including thumb - 2-3 lbs weight - like knob for thumb opposition and PA

## 2017-03-12 NOTE — Therapy (Signed)
[facility] [address], [place], [address] Phone: [phone]   Fax:  [phone]  Occupational Therapy Treatment  Patient Details  Name: Francisco Johns MRN: [id number] Date of Birth: [birth year] Referring Provider: Vernona Rieger   Encounter Date: 03/12/2017  OT End of Session - 03/12/17 2049    Visit Number  59    Number of Visits  71    Date for OT Re-Evaluation  05/16/17    OT Start Time  1428    OT Stop Time  1530    OT Time Calculation (min)  62 min    Activity Tolerance  Patient tolerated treatment well    Behavior During Therapy  [facility] for tasks assessed/performed       Past Medical History:  Diagnosis Date  . Metatarsal fracture    right big toe    Past Surgical History:  Procedure Laterality Date  . ankle surgery    . NO PAST SURGERIES    . ORIF TOE FRACTURE Right 12/09/2015   Procedure: OPEN REDUCTION INTERNAL FIXATION (ORIF) METATARSAL (TOE) FRACTURE, first metatarsal;  Surgeon: Renette Butters, MD;  Location: [facility];  Service: Orthopedics;  Laterality: Right;    There were no vitals filed for this visit.  Subjective Assessment - 03/12/17 2047    Subjective   Trying to use it more - like holding objects- like the magazine in waiting room - doing  okay - but that pinkie just do not want to bend - splint can wear 5 min at time - that my finger gets purple - so wear it about 5 x day     Patient Stated Goals  I want as much movement and use I can get for my R dominant hand- to work , play with my kids, fish , play golf ,    Currently in Pain?  No/denies         [facility] OT Assessment - 03/12/17 0001      Right Hand AROM   R Index PIP 0-100  100 Degrees  (Pended)     R Long PIP 0-100  98 Degrees  (Pended)     R Ring PIP 0-100  95 Degrees  (Pended)          Scar massage using vibration proximal to graft - and 2 most Adhere spots with muscle contraction - on harder  fibrotic area - and scar adhesion - with thumb PA and RA  xtrator on scar adhesion proximal 10 x and  Wrist in extention and digits in extention and flexion And distal on graft 6 times -ROM gripping and thumb PA done this date - no irritation with extractor Done graston tool nr 2 on volar forearm - proximal and lateral to graft   Assess scar and where to apply kinesiotape Kinesiotape this date applied on 2 areas on graft -that are adhere - with resistance to 3 thru 5th digits, thumb PA and wrist flexion - did 2 at 100 % pull - but 2 cm wide strips this date - star over distal scar and proximal adhesion on graft  Pt aware of precautions   Assess 3 lbs able to do place and hold active fist  But not with 4 or  5 lbs  But with wrap hand in fist around 4lbs  Can do place and hold wrist extention , for wrist RD, UD , sup and pronation - 8 repseach  -but need to  support during pronation  10reps  And 5 lbs with fist wrap - could do sup and pro - pick up midway - rest and end positions   Tool 502( small) for wrist flexion and extention - grip with ulnar 3 digit and coban strap 5th and 4th into flexion - wrist extention and flexion-120sec each  And 1 lbs   Large knob 2lbs for sup and pronation vertical plane position this - taped thumb into extention for IP   Gripper - 15lbs 120 sec -  flexion strap 5th and 4th into flexion-Work on end range for grip - graft not as much adhere this date -    Fabricated hand base dorsal splint to block MC of 5th in extention and allow PIP flexion - to about 80 degrees - can activite it and around smaller cylinder grip - to use with steering wheel , broom and ect during day for short periods - knowledge on precautions         Scar massage using vibration proximal to graft - and 2 most Adhere spots with muscle contraction - on harder fibrotic area - and scar adhesion - with thumb PA and RA  xtrator on scar adhesion  proximal 10 x and  Wrist in extention and digits in extention and flexion And distal on graft 6 times -ROM gripping and thumb PA done this date - no irritation with extractor Done graston tool nr 2 on volar forearm - proximal and lateral to graft   Assess scar and where to apply kinesiotape Kinesiotape this date applied on 2 areas on graft -that are adhere - with resistance to 3 thru 5th digits, thumb PA and wrist flexion - did 2 at 100 % pull - but 2 cm wide strips this date - star over distal scar and proximal adhesion on graft  Pt aware of precautions   Assess 3 lbs able to do place and hold active fist  But not with 4 or  5 lbs  Wrap 5th and 4th in fist around 4lbs  Can do place and hold wrist extention , for wrist RD, UD , sup and pronation - 8 repseach  -but need to support during pronation  10reps    Tool 502( small) for wrist flexion and extention - grip with ulnar 3 digit and coban strap 5th and 4th into flexion - wrist extention and flexion-120sec each  And 1 lbs Tool nr 502 sup and pronation - difficulty with thumb position -   Large knob 2lbs for sup and pronation vertical plane position this - taped thumb iinto PA - ease with sup but difficulty with pronation     Gripper - 15lbs 120 sec -  flexion strap 5th and 4th into flexion-Work on end range for grip - graft not as much adhere this date -  Lat grip 15 lbs - did not had to tape thumb - able to align                  OT Education - 03/12/17 2049    Education provided  Yes    Education Details  HEP     Person(s) Educated  Patient    Methods  Explanation;Demonstration;Tactile cues;Verbal cues    Comprehension  Verbalized understanding;Returned demonstration;Verbal cues required       OT Short Term Goals - 01/25/17 1047      OT SHORT TERM GOAL #1   Title  Pt to be ind in HEP to use splints correctly ,  increase ROM and sensatin in R hand and wrist     Status   Achieved      OT SHORT TERM GOAL #2   Title  Fabricate/ modify or change splints as needed to prevent contractures in R hand and wrist while nerve healing occur    Status  Achieved      OT SHORT TERM GOAL #3   Title  R wrist AROM and PROM improve with 10-15 degrees to be able carry object on palm, wipe table and reach with wrist extention to grip     Baseline  improving but not able to do all act     Time  4    Period  Weeks    Status  On-going    Target Date  02/22/17      OT SHORT TERM GOAL #4   Title  Sensation improve in R hand to identify  ojbects in palm 50% of time     Baseline  Semmes Weinstein Radial Nerve normal dorsal and except distal to MP of thumb , 4.56 volar hand except DIP and PIP of 5th , and 4.31 hand except volar 5th     Time  4    Period  Weeks    Status  On-going    Target Date  03/15/17        OT Long Term Goals - 01/25/17 1049      OT LONG TERM GOAL #1   Title  Upgrade HEP to increase PIP and MC flexion as nerve is healing to grasp 6 inch object to 3 inch object    Baseline  can use lat grip but not 3 point or full tight grip     Time  4    Period  Weeks    Status  On-going    Target Date  02/22/17      OT LONG TERM GOAL #2   Title  Wrist ROM improve and strength for pt to intiate using 1 lbs weight for wrist in all planes     Baseline  using 3 lbs for wrist     Status  Achieved      OT LONG TERM GOAL #3   Title  Assess shoulder and upgrade HEP to decrease pain and compensation     Status  Achieved      OT LONG TERM GOAL #4   Title  Pt to use 5 lbs at wrist and elbow without pain or discomfort to use in ADL's and IADL's     Baseline  3 lbs all planes     Time  4    Period  Weeks    Status  On-going    Target Date  02/15/17            Plan - 03/12/17 2050    Clinical Impression Statement  Pt cont to show increase use - but still lmited but scar adhesion - increase flexion contracture at 3rd digit - increase flexion of digits -  icnrease strength at wrist - limited in flexion of PIP flexion of 5th without blocking it     Occupational performance deficits (Please refer to evaluation for details):  ADL's;IADL's;Work;Play;Leisure    Rehab Potential  Good    OT Frequency  2x / week    OT Duration  6 weeks    OT Treatment/Interventions  Self-care/ADL training;Fluidtherapy;Splinting;Patient/family education;Therapeutic exercises;Ultrasound;Scar mobilization;Passive range of motion;Neuromuscular education;Electrical Stimulation;Manual Therapy    Plan  upgrade HEP as needed  Clinical Decision Making  Multiple treatment options, significant modification of task necessary    OT Home Exercise Plan  see pt instruction    Consulted and Agree with Plan of Care  Patient       Patient will benefit from skilled therapeutic intervention in order to improve the following deficits and impairments:  Decreased range of motion, Impaired flexibility, Decreased coordination, Decreased safety awareness, Increased edema, Impaired sensation, Decreased skin integrity, Decreased knowledge of precautions, Decreased knowledge of use of DME, Decreased scar mobility, Impaired UE functional use, Pain, Decreased strength, Impaired perceived functional ability  Visit Diagnosis: Stiffness of right wrist, not elsewhere classified  Pain in right arm  Stiffness of right hand, not elsewhere classified  Other disturbances of skin sensation  Scar condition and fibrosis of skin  Muscle weakness (generalized)    Problem List There are no active problems to display for this patient.   Rosalyn Gess OTR/L,CLT  03/12/2017, 8:54 PM  [facility] [address] [place], [address] Phone: [phone]   Fax:  [phone]  Name: MCKALE HAFFEY MRN: [id number] Date of Birth: 02/04/1976

## 2017-03-18 ENCOUNTER — Ambulatory Visit: Payer: 59 | Admitting: Occupational Therapy

## 2017-03-18 DIAGNOSIS — M6281 Muscle weakness (generalized): Secondary | ICD-10-CM | POA: Diagnosis not present

## 2017-03-18 DIAGNOSIS — M79601 Pain in right arm: Secondary | ICD-10-CM

## 2017-03-18 DIAGNOSIS — L905 Scar conditions and fibrosis of skin: Secondary | ICD-10-CM | POA: Diagnosis not present

## 2017-03-18 DIAGNOSIS — M25631 Stiffness of right wrist, not elsewhere classified: Secondary | ICD-10-CM | POA: Diagnosis not present

## 2017-03-18 DIAGNOSIS — R208 Other disturbances of skin sensation: Secondary | ICD-10-CM | POA: Diagnosis not present

## 2017-03-18 DIAGNOSIS — M25641 Stiffness of right hand, not elsewhere classified: Secondary | ICD-10-CM | POA: Diagnosis not present

## 2017-03-18 NOTE — Therapy (Signed)
[facility] [address] [place], [address] Phone: [phone]   Fax:  [phone]  Occupational Therapy Treatment  Patient Details  Name: Francisco Johns MRN: [id number] Date of Birth: 1975/07/08 Referring Provider: Vernona Rieger   Encounter Date: 03/18/2017  OT End of Session - 03/18/17 1016    Visit Number  60    Number of Visits  37    Date for OT Re-Evaluation  05/16/17    OT Start Time  0918    OT Stop Time  1040    OT Time Calculation (min)  82 min    Activity Tolerance  Patient tolerated treatment well    Behavior During Therapy  [facility] for tasks assessed/performed       Past Medical History:  Diagnosis Date  . Metatarsal fracture    right big toe    Past Surgical History:  Procedure Laterality Date  . ankle surgery    . NO PAST SURGERIES    . ORIF TOE FRACTURE Right 12/09/2015   Procedure: OPEN REDUCTION INTERNAL FIXATION (ORIF) METATARSAL (TOE) FRACTURE, first metatarsal;  Surgeon: Renette Butters, MD;  Location: [facility];  Service: Orthopedics;  Laterality: Right;    There were no vitals filed for this visit.  Subjective Assessment - 03/18/17 1011    Subjective   doing okay -had this past week more nerve pain in fingers - trying to use t more - I was able to do the pinkie splint about 15 min     Patient Stated Goals  I want as much movement and use I can get for my R dominant hand- to work , play with my kids, fish , play golf ,    Currently in Pain?  Yes    Pain Score  2     Pain Location  Hand    Pain Orientation  Right    Pain Descriptors / Indicators  -- nerve pain     Pain Type  Surgical pain         [facility] OT Assessment - 03/18/17 0001      Strength   Right Hand Grip (lbs)  18 supported , Benik 21 and supported     Right Hand Lateral Pinch  5.5 lbs      Right Hand AROM   R Thumb Palmar ABduction/ADduction 0-45  48       Scar massage using vibration  proximal to graft - and 2 most Adhere spots with muscle contraction - on harder fibrotic area - and scar adhesion - with thumb PA and RA  xtrator on scar adhesion proximal 10 x andWrist in extention and digits in extention and flexionAnd distal on graft 6 times -ROM gripping and thumb PA done this date - no irritation with extractor     5 lbs with Benik- cold not maintain neutral wrist - but over table edge - 10 reps - reposition in hand 2 x  With lag of 4thand 5th digit flexion    Add to HEP   and to do turning knob simulating with 5 lbs rolling over curve surface - sup and pronation - to work on thumb PA   Can wrap 4thadn 5th digit into flexion with 4lbsCan do place and hold wrist extention ,for wrist RD, UD , sup and pronation -8 repseach-but need to support during pronation  10reps And 5 lbs with4th and 5th wrap - could do sup and pro - pick up  midway - rest and end positions  Tool 502( small) for wrist flexion and extention - grip with ulnar 3 digit and coban strap 5th and 4th into flexion - wrist extention and flexion-120sec each  And 2 lbs Tool 502 for sup and pronation 3 lbs - like screwdriver - keep thumb extended 120 sec each    Large knob 3lbs for sup and 1 lbs for  pronation vertical plane position this - taped thumb into extention for IP Gripper - 15lbs 120 sec - flexion strap 5th and 4th into flexion-Work on end range for grip - graft not as much adhere this date -            Gripper - 18lbs 120 sec - flexion strap 5th and 4th into flexion-Work on end range for grip - graft not as much adhere this date -  And then repeat 120 sec at 38 lbs gripping  Gripper 162 tool  Lat grip 15 lbs - did not had to tape thumb - able to align     Estim done reed - for thumb RA with hand in neutral - on 15 current - protocol for muscle reed for hand  On base  thumb And proximal forearm  8  min                      OT Education - 03/18/17 1014    Education provided  Yes    Education Details  work on thumb extention , ABD, wrist extention wtih grip , sup and pronation turning large knob, 5th flexion splint     Person(s) Educated  Patient    Methods  Explanation;Demonstration;Tactile cues    Comprehension  Verbalized understanding       OT Short Term Goals - 01/25/17 1047      OT SHORT TERM GOAL #1   Title  Pt to be ind in HEP to use splints correctly , increase ROM and sensatin in R hand and wrist     Status  Achieved      OT SHORT TERM GOAL #2   Title  Fabricate/ modify or change splints as needed to prevent contractures in R hand and wrist while nerve healing occur    Status  Achieved      OT SHORT TERM GOAL #3   Title  R wrist AROM and PROM improve with 10-15 degrees to be able carry object on palm, wipe table and reach with wrist extention to grip     Baseline  improving but not able to do all act     Time  4    Period  Weeks    Status  On-going    Target Date  02/22/17      OT SHORT TERM GOAL #4   Title  Sensation improve in R hand to identify  ojbects in palm 50% of time     Baseline  Semmes Weinstein Radial Nerve normal dorsal and except distal to MP of thumb , 4.56 volar hand except DIP and PIP of 5th , and 4.31 hand except volar 5th     Time  4    Period  Weeks    Status  On-going    Target Date  03/15/17        OT Long Term Goals - 01/25/17 1049      OT LONG TERM GOAL #1   Title  Upgrade HEP to increase PIP and MC flexion as nerve is healing to grasp  6 inch object to 3 inch object    Baseline  can use lat grip but not 3 point or full tight grip     Time  4    Period  Weeks    Status  On-going    Target Date  02/22/17      OT LONG TERM GOAL #2   Title  Wrist ROM improve and strength for pt to intiate using 1 lbs weight for wrist in all planes     Baseline  using 3 lbs for wrist     Status  Achieved      OT LONG TERM  GOAL #3   Title  Assess shoulder and upgrade HEP to decrease pain and compensation     Status  Achieved      OT LONG TERM GOAL #4   Title  Pt to use 5 lbs at wrist and elbow without pain or discomfort to use in ADL's and IADL's     Baseline  3 lbs all planes     Time  4    Period  Weeks    Status  On-going    Target Date  02/15/17            Plan - 03/18/17 1017    Clinical Impression Statement  SHow slow but steady progress - more smaller progress while waiting for last 2-3 muscle innervation to come in - the last 2-3 wks more arching of hand to weight bear thru thumb to 3rd - 4th and 5th still not able to - show increase PA of thumb - but not opponens     Occupational performance deficits (Please refer to evaluation for details):  ADL's;IADL's;Work;Play;Leisure    Rehab Potential  Good    OT Frequency  2x / week    OT Duration  6 weeks    OT Treatment/Interventions  Self-care/ADL training;Fluidtherapy;Splinting;Patient/family education;Therapeutic exercises;Ultrasound;Scar mobilization;Passive range of motion;Neuromuscular education;Electrical Stimulation;Manual Therapy    Plan  upgrade as needed     Clinical Decision Making  Multiple treatment options, significant modification of task necessary    OT Home Exercise Plan  see pt instruction    Consulted and Agree with Plan of Care  Patient       Patient will benefit from skilled therapeutic intervention in order to improve the following deficits and impairments:  Decreased range of motion, Impaired flexibility, Decreased coordination, Decreased safety awareness, Increased edema, Impaired sensation, Decreased skin integrity, Decreased knowledge of precautions, Decreased knowledge of use of DME, Decreased scar mobility, Impaired UE functional use, Pain, Decreased strength, Impaired perceived functional ability  Visit Diagnosis: Stiffness of right wrist, not elsewhere classified  Pain in right arm  Stiffness of right hand, not  elsewhere classified  Other disturbances of skin sensation  Scar condition and fibrosis of skin  Muscle weakness (generalized)    Problem List There are no active problems to display for this patient.   Rosalyn Gess OTR/L,CLT 03/18/2017, 10:41 AM  [facility] [address] [place], [address] Phone: [phone]   Fax:  [phone]  Name: Francisco Johns MRN: [id number] Date of Birth: Sep 19, 1975

## 2017-03-21 ENCOUNTER — Ambulatory Visit: Payer: 59 | Admitting: Occupational Therapy

## 2017-03-21 DIAGNOSIS — M79601 Pain in right arm: Secondary | ICD-10-CM | POA: Diagnosis not present

## 2017-03-21 DIAGNOSIS — R208 Other disturbances of skin sensation: Secondary | ICD-10-CM | POA: Diagnosis not present

## 2017-03-21 DIAGNOSIS — M6281 Muscle weakness (generalized): Secondary | ICD-10-CM

## 2017-03-21 DIAGNOSIS — L905 Scar conditions and fibrosis of skin: Secondary | ICD-10-CM

## 2017-03-21 DIAGNOSIS — M25641 Stiffness of right hand, not elsewhere classified: Secondary | ICD-10-CM | POA: Diagnosis not present

## 2017-03-21 DIAGNOSIS — M25631 Stiffness of right wrist, not elsewhere classified: Secondary | ICD-10-CM

## 2017-03-21 NOTE — Patient Instructions (Signed)
Same

## 2017-03-21 NOTE — Therapy (Signed)
[facility] [address], [place], [address] Phone: [phone]   Fax:  [phone]  Occupational Therapy Treatment  Patient Details  Name: Francisco Johns MRN: [id number] Date of Birth: [birth year] Referring Provider: Vernona Rieger   Encounter Date: 03/21/2017  OT End of Session - 03/21/17 1626    Visit Number  61    Number of Visits  72    Date for OT Re-Evaluation  05/16/17    OT Start Time  1405    OT Stop Time  1523    OT Time Calculation (min)  78 min    Activity Tolerance  Patient tolerated treatment well    Behavior During Therapy  [facility] for tasks assessed/performed       Past Medical History:  Diagnosis Date  . Metatarsal fracture    right big toe    Past Surgical History:  Procedure Laterality Date  . ankle surgery    . NO PAST SURGERIES    . ORIF TOE FRACTURE Right 12/09/2015   Procedure: OPEN REDUCTION INTERNAL FIXATION (ORIF) METATARSAL (TOE) FRACTURE, first metatarsal;  Surgeon: Renette Butters, MD;  Location: [place];  Service: Orthopedics;  Laterality: Right;    There were no vitals filed for this visit.  Subjective Assessment - 03/21/17 1624    Subjective   I brought the thumb webspacer to adjust to keep thumb more into extention  - and see I can wear the pinkie splint for about 30 min now     Patient Stated Goals  I want as much movement and use I can get for my R dominant hand- to work , play with my kids, fish , play golf ,    Currently in Pain?  No/denies       Assess web spacer - could not adjust to allow more RA - fabricated new one that extended to wrist for stability   Add to 5th MC block hand base splint dynamic traction for PIP flexion and pt to do active extention to PIP   to wear only to tolerance        Scar massage using vibration proximal to graft - and 2 most Adhere spots with muscle contraction - on harder fibrotic area - and scar adhesion -  with thumb PA and RA  xtrator on scar adhesion proximal 10 x andWrist in extention and digits in extention and flexionAnd distal on graft 6 times -ROM gripping and thumb PA done this date - no irritation with extractor       Can wrap 4thadn 5th digit into flexion with 4lbsCan do place and hold wrist extention ,for wrist RD, UD , sup and pronation -8 repseach-but need to support during pronation  10reps And 5 lbs with4th and 5th wrap - could do sup and pro - pick up midway - rest and end positions  Tool 502( small) for wrist flexion and extention - grip with ulnar 3 digit and coban strap 5th and 4th into flexion - wrist extention and flexion-120sec each  At 2 lbs Large knob for sup and pronation 2 lbs - like screwdriver - keep thumb extended 120 sec each  - done taping to assist thumb to stay in PA during supination   Large knob 2lbs for RD and UD - in horizontal  plane position this - taped thumb into extention for IPand PA during RD  Gripper - 15lbs 120 sec - flexion strap 5th and 4th into  flexion-Work on end range for grip  And gripper at 38 lbs for full grip - no wrapping   Lat grip - tape IP into extention - 18 lbs  120 sec   And then fingertips on wall for textures  - keep arch - pushing and taking weight     Estim done reed - for thumb RA with hand in neutral - on 15 current - protocol for muscle reed for hand  On base  thumb And proximal forearm  8 min               OT Education - 03/21/17 1625    Education provided  Yes    Education Details  Splint wearing change -made adjustment and new thumb webspacer - but more thumb RA    Person(s) Educated  Patient    Methods  Explanation    Comprehension  Verbalized understanding;Returned demonstration       OT Short Term Goals - 01/25/17 1047      OT SHORT TERM GOAL #1   Title  Pt to be ind in HEP to use splints correctly , increase ROM and sensatin in R  hand and wrist     Status  Achieved      OT SHORT TERM GOAL #2   Title  Fabricate/ modify or change splints as needed to prevent contractures in R hand and wrist while nerve healing occur    Status  Achieved      OT SHORT TERM GOAL #3   Title  R wrist AROM and PROM improve with 10-15 degrees to be able carry object on palm, wipe table and reach with wrist extention to grip     Baseline  improving but not able to do all act     Time  4    Period  Weeks    Status  On-going    Target Date  02/22/17      OT SHORT TERM GOAL #4   Title  Sensation improve in R hand to identify  ojbects in palm 50% of time     Baseline  Semmes Weinstein Radial Nerve normal dorsal and except distal to MP of thumb , 4.56 volar hand except DIP and PIP of 5th , and 4.31 hand except volar 5th     Time  4    Period  Weeks    Status  On-going    Target Date  03/15/17        OT Long Term Goals - 01/25/17 1049      OT LONG TERM GOAL #1   Title  Upgrade HEP to increase PIP and MC flexion as nerve is healing to grasp 6 inch object to 3 inch object    Baseline  can use lat grip but not 3 point or full tight grip     Time  4    Period  Weeks    Status  On-going    Target Date  02/22/17      OT LONG TERM GOAL #2   Title  Wrist ROM improve and strength for pt to intiate using 1 lbs weight for wrist in all planes     Baseline  using 3 lbs for wrist     Status  Achieved      OT LONG TERM GOAL #3   Title  Assess shoulder and upgrade HEP to decrease pain and compensation     Status  Achieved      OT LONG TERM GOAL #  4   Title  Pt to use 5 lbs at wrist and elbow without pain or discomfort to use in ADL's and IADL's     Baseline  3 lbs all planes     Time  4    Period  Weeks    Status  On-going    Target Date  02/15/17            Plan - 03/21/17 1626    Clinical Impression Statement  Pt cont to be limited by scar adhesions and nerve healing - pt webspacer adjusted to allow more RA - pt PA improving -  slow progress in 4th and 5 th digits flexion     Occupational performance deficits (Please refer to evaluation for details):  ADL's;IADL's;Play;Leisure    Rehab Potential  Good    OT Frequency  2x / week    OT Duration  6 weeks    OT Treatment/Interventions  Self-care/ADL training;Fluidtherapy;Splinting;Patient/family education;Therapeutic exercises;Ultrasound;Scar mobilization;Passive range of motion;Neuromuscular education;Electrical Stimulation;Manual Therapy    Plan  upgrade as needed ther ex, cont scar tissue mobs - check on splints    OT Home Exercise Plan  see pt instruction    Consulted and Agree with Plan of Care  Patient       Patient will benefit from skilled therapeutic intervention in order to improve the following deficits and impairments:  Decreased range of motion, Impaired flexibility, Decreased coordination, Decreased safety awareness, Increased edema, Impaired sensation, Decreased skin integrity, Decreased knowledge of precautions, Decreased knowledge of use of DME, Decreased scar mobility, Impaired UE functional use, Pain, Decreased strength, Impaired perceived functional ability  Visit Diagnosis: Stiffness of right wrist, not elsewhere classified  Pain in right arm  Stiffness of right hand, not elsewhere classified  Other disturbances of skin sensation  Scar condition and fibrosis of skin  Muscle weakness (generalized)    Problem List There are no active problems to display for this patient.   Rosalyn Gess OTR/L,CLT 03/21/2017, 4:29 PM  [facility] [address] [place], [address] Phone: [phone]   Fax:  [phone]  Name: Francisco Johns MRN: [id number] Date of Birth: Mar 01, 1976

## 2017-03-24 IMAGING — DX DG FOREARM 2V*R*
1 series · 1 of 1 positions shown · non-contrast
Comparison: None.

CLINICAL DATA: Amputation from machinery.

EXAM:
RIGHT FOREARM - 2 VIEW

[humerus ap]
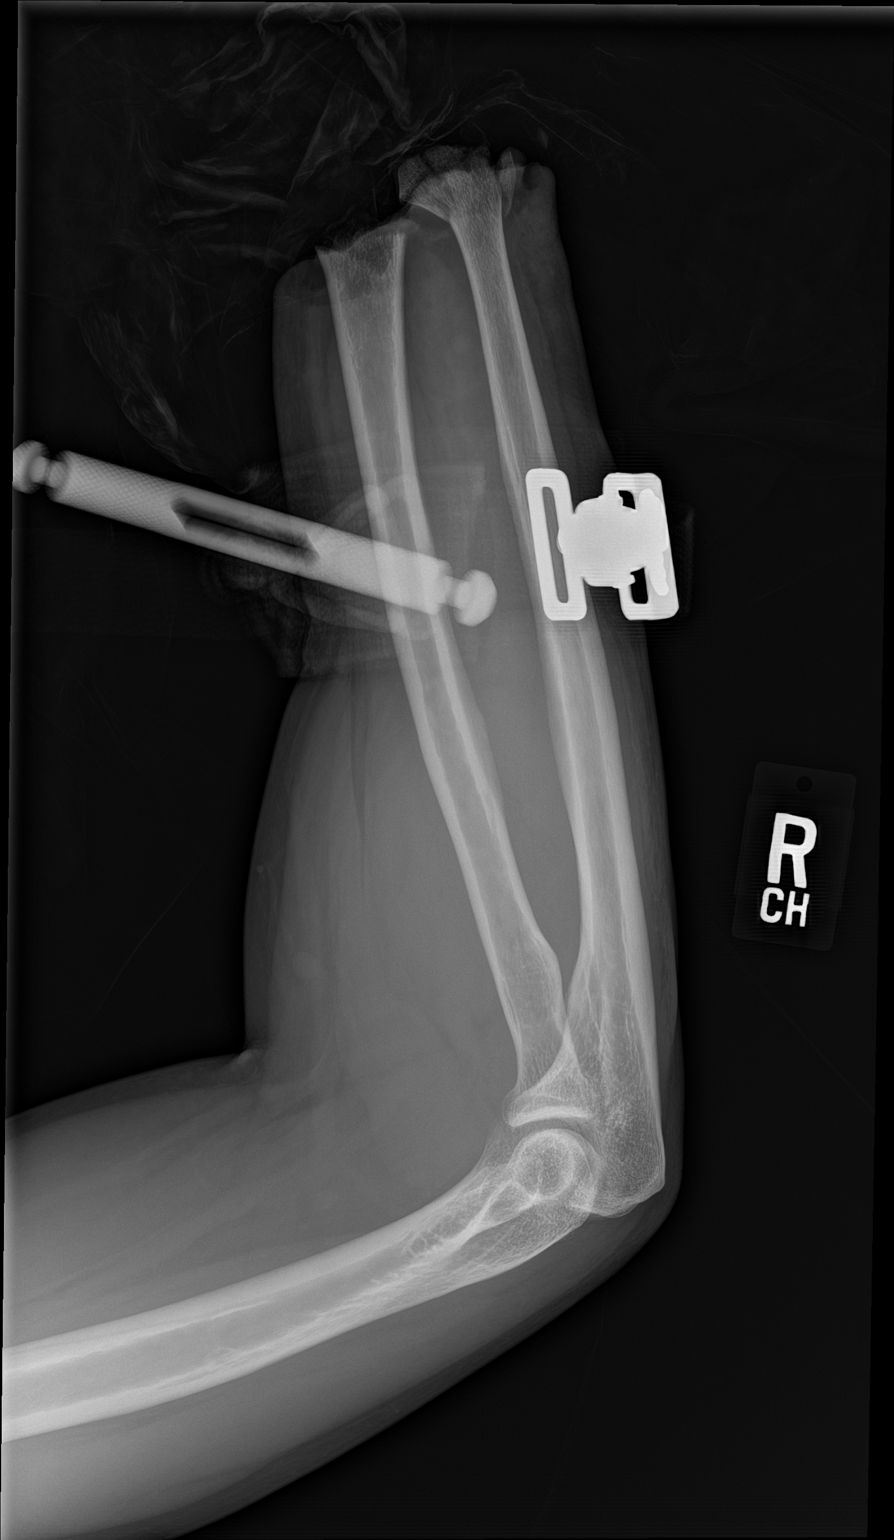

[1 of 1 positions shown; findings below may reference images not displayed]

FINDINGS: There is been traumatic amputation of the right wrist and hand.
Amputation of the distal right radius and ulna is noted, with bone
fragments noted around the distal right ulna.
IMPRESSION: Traumatic amputation as described above.

## 2017-03-25 ENCOUNTER — Ambulatory Visit: Payer: 59 | Attending: Plastic Surgery | Admitting: Occupational Therapy

## 2017-03-25 DIAGNOSIS — M25641 Stiffness of right hand, not elsewhere classified: Secondary | ICD-10-CM | POA: Insufficient documentation

## 2017-03-25 DIAGNOSIS — M6281 Muscle weakness (generalized): Secondary | ICD-10-CM | POA: Insufficient documentation

## 2017-03-25 DIAGNOSIS — R208 Other disturbances of skin sensation: Secondary | ICD-10-CM | POA: Insufficient documentation

## 2017-03-25 DIAGNOSIS — M79601 Pain in right arm: Secondary | ICD-10-CM | POA: Diagnosis not present

## 2017-03-25 DIAGNOSIS — M25631 Stiffness of right wrist, not elsewhere classified: Secondary | ICD-10-CM | POA: Insufficient documentation

## 2017-03-25 DIAGNOSIS — L905 Scar conditions and fibrosis of skin: Secondary | ICD-10-CM | POA: Diagnosis not present

## 2017-03-25 NOTE — Therapy (Signed)
[facility] [address], [place], [address] Phone: [phone]   Fax:  [phone]  Occupational Therapy Treatment  Patient Details  Name: Francisco Johns MRN: [id number] Date of Birth: 1975/06/22 Referring Provider: Vernona Rieger   Encounter Date: 03/25/2017  OT End of Session - 03/25/17 1330    Visit Number  46    Number of Visits  75    Date for OT Re-Evaluation  05/16/17    OT Start Time  9485    OT Stop Time  1407    OT Time Calculation (min)  69 min    Activity Tolerance  Patient tolerated treatment well    Behavior During Therapy  [facility] for tasks assessed/performed       Past Medical History:  Diagnosis Date  . Metatarsal fracture    right big toe    Past Surgical History:  Procedure Laterality Date  . ankle surgery    . NO PAST SURGERIES    . ORIF TOE FRACTURE Right 12/09/2015   Procedure: OPEN REDUCTION INTERNAL FIXATION (ORIF) METATARSAL (TOE) FRACTURE, first metatarsal;  Surgeon: Renette Butters, MD;  Location: [facility];  Service: Orthopedics;  Laterality: Right;    There were no vitals filed for this visit.  Subjective Assessment - 03/25/17 1328    Subjective   About the same- still numb on top of hand - tyring to use it - feels like wrist do not want to be straight but go sideways - thinking just down road - appt with surgeon next week     Patient Stated Goals  I want as much movement and use I can get for my R dominant hand- to work , play with my kids, fish , play golf ,    Currently in Pain?  No/denies       Semmes weinstein - could feel 4.31 on ulnar side of hand and digits - but  3.61 not consistently- some at thumb and palm    Scar massage using vibration proximal to graft - and 2 most Adhere spots with muscle contraction - on harder fibrotic area - and scar adhesion - with thumb PA and RA  xtrator on scar adhesion proximal 10 x andWrist in extention and digits  in extention and flexionAnd distal on graft 6 times -ROM gripping and thumb PA done this date - no irritation with extractor  D ring done at 4 lbs - and wrap 5th around - 120 sec each - supination felt pain over ulnar styloid before full range - partial pronation  Can wrap 4thadn 5th digit into flexion with4lbsCan do place and hold wrist extention ,for wrist RD, UD , sup and pronation -8 repseach-but need to support during pronation  10reps And 5 lbs with4th and 5th wrap- could do sup and pro - pick up midway - rest and end positions  Tool 502( small) for wrist flexion and extention - grip with ulnar 3 digit and coban strap 5th and 4th into flexion - wrist extention and flexion-120sec each  At2lbs Large knob for sup and pronation 2 lbs -  On horizontal planes  -taped thumb to facilitate Opposition    Large knob 2lbs for RD and UD - in horizontal  plane position this - taped thumb into extention for IPand PA during RD  Gripper - 18lbs 120 sec - flexion strap 5th and 4th into flexion-Work on end range for grip  And gripper at 38 lbs  for full grip - no wrapping   Lat grip - tape IP into extention - 25 lbs  120 sec   And then fingertips on wall for textures  - keep arch - pushing and taking weight                      OT Education - 03/25/17 1329    Education provided  Yes    Education Details  HEP for rom and strengtening- scar tissue     Person(s) Educated  Patient    Methods  Explanation;Demonstration;Tactile cues    Comprehension  Verbalized understanding;Returned demonstration       OT Short Term Goals - 01/25/17 1047      OT SHORT TERM GOAL #1   Title  Pt to be ind in HEP to use splints correctly , increase ROM and sensatin in R hand and wrist     Status  Achieved      OT SHORT TERM GOAL #2   Title  Fabricate/ modify or change splints as needed to prevent contractures in R hand and wrist while nerve healing  occur    Status  Achieved      OT SHORT TERM GOAL #3   Title  R wrist AROM and PROM improve with 10-15 degrees to be able carry object on palm, wipe table and reach with wrist extention to grip     Baseline  improving but not able to do all act     Time  4    Period  Weeks    Status  On-going    Target Date  02/22/17      OT SHORT TERM GOAL #4   Title  Sensation improve in R hand to identify  ojbects in palm 50% of time     Baseline  Semmes Weinstein Radial Nerve normal dorsal and except distal to MP of thumb , 4.56 volar hand except DIP and PIP of 5th , and 4.31 hand except volar 5th     Time  4    Period  Weeks    Status  On-going    Target Date  03/15/17        OT Long Term Goals - 01/25/17 1049      OT LONG TERM GOAL #1   Title  Upgrade HEP to increase PIP and MC flexion as nerve is healing to grasp 6 inch object to 3 inch object    Baseline  can use lat grip but not 3 point or full tight grip     Time  4    Period  Weeks    Status  On-going    Target Date  02/22/17      OT LONG TERM GOAL #2   Title  Wrist ROM improve and strength for pt to intiate using 1 lbs weight for wrist in all planes     Baseline  using 3 lbs for wrist     Status  Achieved      OT LONG TERM GOAL #3   Title  Assess shoulder and upgrade HEP to decrease pain and compensation     Status  Achieved      OT LONG TERM GOAL #4   Title  Pt to use 5 lbs at wrist and elbow without pain or discomfort to use in ADL's and IADL's     Baseline  3 lbs all planes     Time  4    Period  Weeks  Status  On-going    Target Date  02/15/17            Plan - 03/25/17 1330    Clinical Impression Statement  Sensation improving - and nerve - but scar adhesions limiting progress in wrist and digits ROM - pt report wrist feels more stiff lately - will do fluido again next time prior to some stretches     Occupational performance deficits (Please refer to evaluation for details):  ADL's;IADL's;Play;Leisure;Work     Rehab Potential  Good    OT Frequency  2x / week    OT Duration  4 weeks    OT Treatment/Interventions  Self-care/ADL training;Fluidtherapy;Splinting;Therapeutic exercise;Passive range of motion;Manual Therapy;Scar mobilization    Plan  upgrade as needed and do fluidotherapy to icnrease flexibility at wrist     Clinical Decision Making  Multiple treatment options, significant modification of task necessary    OT Home Exercise Plan  see pt instruction    Consulted and Agree with Plan of Care  Patient       Patient will benefit from skilled therapeutic intervention in order to improve the following deficits and impairments:  Decreased range of motion, Impaired flexibility, Decreased coordination, Decreased safety awareness, Increased edema, Impaired sensation, Decreased skin integrity, Decreased knowledge of precautions, Decreased knowledge of use of DME, Decreased scar mobility, Impaired UE functional use, Pain, Decreased strength, Impaired perceived functional ability  Visit Diagnosis: Stiffness of right wrist, not elsewhere classified  Pain in right arm  Stiffness of right hand, not elsewhere classified  Other disturbances of skin sensation  Scar condition and fibrosis of skin  Muscle weakness (generalized)    Problem List There are no active problems to display for this patient.   Rosalyn Gess OTR/L,CLT 03/25/2017, 2:51 PM  [facility] [address] [place], [address] Phone: [phone]   Fax:  [phone]  Name: Francisco Johns MRN: [id number] Date of Birth: [birth year]

## 2017-03-28 ENCOUNTER — Ambulatory Visit: Payer: 59 | Admitting: Occupational Therapy

## 2017-03-28 DIAGNOSIS — M79601 Pain in right arm: Secondary | ICD-10-CM

## 2017-03-28 DIAGNOSIS — M25641 Stiffness of right hand, not elsewhere classified: Secondary | ICD-10-CM

## 2017-03-28 DIAGNOSIS — R208 Other disturbances of skin sensation: Secondary | ICD-10-CM

## 2017-03-28 DIAGNOSIS — L905 Scar conditions and fibrosis of skin: Secondary | ICD-10-CM

## 2017-03-28 DIAGNOSIS — M25631 Stiffness of right wrist, not elsewhere classified: Secondary | ICD-10-CM

## 2017-03-28 DIAGNOSIS — M6281 Muscle weakness (generalized): Secondary | ICD-10-CM

## 2017-03-28 NOTE — Patient Instructions (Addendum)
  Composite ulnar and med N glide - by OT and on wall - to do at home  Pull in distal palm and 3rd digit  And well as thumb  To only 5 reps at time

## 2017-03-28 NOTE — Therapy (Signed)
[facility] [address], [place], [address] Phone: [phone]   Fax:  [phone]  Occupational Therapy Treatment  Patient Details  Name: Francisco Johns MRN: [id number] Date of Birth: 1976/01/06 Referring Provider: Vernona Rieger   Encounter Date: 03/28/2017  OT End of Session - 03/28/17 1011    Visit Number  63    Number of Visits  62    Date for OT Re-Evaluation  05/16/17    OT Start Time  1004    OT Stop Time  1106    OT Time Calculation (min)  62 min    Activity Tolerance  Patient tolerated treatment well    Behavior During Therapy  [facility] for tasks assessed/performed       Past Medical History:  Diagnosis Date  . Metatarsal fracture    right big toe    Past Surgical History:  Procedure Laterality Date  . ankle surgery    . NO PAST SURGERIES    . ORIF TOE FRACTURE Right 12/09/2015   Procedure: OPEN REDUCTION INTERNAL FIXATION (ORIF) METATARSAL (TOE) FRACTURE, first metatarsal;  Surgeon: Renette Butters, MD;  Location: [facility];  Service: Orthopedics;  Laterality: Right;    There were no vitals filed for this visit.  Subjective Assessment - 03/28/17 1009    Subjective   With this colder weather it is more ache and more stiff my hand and wrist - thumb is coming along good - but wrist and pinkie I do not see to much     Patient Stated Goals  I want as much movement and use I can get for my R dominant hand- to work , play with my kids, fish , play golf ,    Currently in Pain?  Yes    Pain Score  1     Pain Location  Hand    Pain Orientation  Right    Pain Descriptors / Indicators  Aching         [facility] OT Assessment - 03/28/17 0001      Strength   Right Hand Grip (lbs)  18 Support with Benik 21 lbs     Right Hand Lateral Pinch  5.5 lbs    Left Hand Grip (lbs)  105    Left Hand Lateral Pinch  23 lbs               OT Treatments/Exercises (OP) - 03/28/17 0001       RUE Fluidotherapy   Number Minutes Fluidotherapy  10 Minutes    RUE Fluidotherapy Location  Hand;Wrist    Comments  decrease pain and stiffness        After fluido  Scar massage using vibration proximal to graft - and 2 most Adhere spots with muscle contraction - on harder fibrotic area - and scar adhesion - with thumb PA and RA  xtrator on scar adhesion proximal 10 x andWrist in extention and digits in extention and flexionAnd distal on graft 6 times -ROM gripping and thumb PA done this date - no irritation with extractor Composite ulnar and med N glide - by OT and on wall - to do at home  Pull in distal palm and 3rd digit  And well as thumb  To only 5 reps at time    D ring done at 3 lbs - and wrap- 120 sec each - supination felt pain over ulnar styloid before full range - partial pronation  Tool 502( small) for wrist flexion and extention - grip with ulnar 3 digit and coban strap 5th and 4th into flexion - wrist extention and flexion-120sec each  At2lbs Large knob  RD and UD - 3 lbs UD and 2 lbs for RD - tape thumb in PA  -  On horizontal planes  -taped thumb to facilitate Opposition    Gripper - 20lbs 120 sec - flexion strap 5th and 4th into flexion-Work on end range for grip  And gripper at 38 lbs for full grip - no wrapping  Lat grip -no taping - 25 lbs  120 sec  Able to pick up 5 cm block with 3rd and thumb pinch - , but not 2 cm block  - but able to do 2 point pinch with 2nd and thumb  Pick up 2 cm block Not able to do 2 by 4 cm block opposition palm to digits  And then fingertips on wall for textures - keep arch - pushing and taking weight           OT Education - 03/28/17 1011    Education provided  Yes    Education Details  Nerve glide and composite stretch for extensors of R Arm     Person(s) Educated  Patient    Methods  Explanation;Demonstration;Tactile cues    Comprehension  Verbalized understanding;Returned  demonstration       OT Short Term Goals - 01/25/17 1047      OT SHORT TERM GOAL #1   Title  Pt to be ind in HEP to use splints correctly , increase ROM and sensatin in R hand and wrist     Status  Achieved      OT SHORT TERM GOAL #2   Title  Fabricate/ modify or change splints as needed to prevent contractures in R hand and wrist while nerve healing occur    Status  Achieved      OT SHORT TERM GOAL #3   Title  R wrist AROM and PROM improve with 10-15 degrees to be able carry object on palm, wipe table and reach with wrist extention to grip     Baseline  improving but not able to do all act     Time  4    Period  Weeks    Status  On-going    Target Date  02/22/17      OT SHORT TERM GOAL #4   Title  Sensation improve in R hand to identify  ojbects in palm 50% of time     Baseline  Semmes Weinstein Radial Nerve normal dorsal and except distal to MP of thumb , 4.56 volar hand except DIP and PIP of 5th , and 4.31 hand except volar 5th     Time  4    Period  Weeks    Status  On-going    Target Date  03/15/17        OT Long Term Goals - 01/25/17 1049      OT LONG TERM GOAL #1   Title  Upgrade HEP to increase PIP and MC flexion as nerve is healing to grasp 6 inch object to 3 inch object    Baseline  can use lat grip but not 3 point or full tight grip     Time  4    Period  Weeks    Status  On-going    Target Date  02/22/17      OT LONG TERM GOAL #2  Title  Wrist ROM improve and strength for pt to intiate using 1 lbs weight for wrist in all planes     Baseline  using 3 lbs for wrist     Status  Achieved      OT LONG TERM GOAL #3   Title  Assess shoulder and upgrade HEP to decrease pain and compensation     Status  Achieved      OT LONG TERM GOAL #4   Title  Pt to use 5 lbs at wrist and elbow without pain or discomfort to use in ADL's and IADL's     Baseline  3 lbs all planes     Time  4    Period  Weeks    Status  On-going    Target Date  02/15/17             Plan - 03/28/17 1934    Clinical Impression Statement  Pt cont to show slow progress mostly in thumb movement and function - digits flexion but 3rd digit flexion and wrist flexors tighter this date - could be colder temp - scar tissue cont to limited pt - appear that nerve cont to improve if assess with Tinel and  Semmes Weinstein - pt has  5th MC flexion and blocked PIP flexion -but cannot do PIP flexion of not block  - pt to see surgeon next week - plan to decrease  frequency gradually towards Febr     Occupational performance deficits (Please refer to evaluation for details):  ADL's;IADL's;Play;Leisure;Work    Rehab Potential  Good    OT Frequency  2x / week    OT Duration  4 weeks    OT Treatment/Interventions  Self-care/ADL training;Fluidtherapy;Splinting;Therapeutic exercise;Passive range of motion;Manual Therapy;Scar mobilization    Plan  upgrade as needed and do fluidotherapy to icnrease flexibility at wrist     Clinical Decision Making  Multiple treatment options, significant modification of task necessary    OT Home Exercise Plan  see pt instruction    Consulted and Agree with Plan of Care  Patient       Patient will benefit from skilled therapeutic intervention in order to improve the following deficits and impairments:  Decreased range of motion, Impaired flexibility, Decreased coordination, Decreased safety awareness, Increased edema, Impaired sensation, Decreased skin integrity, Decreased knowledge of precautions, Decreased knowledge of use of DME, Decreased scar mobility, Impaired UE functional use, Pain, Decreased strength, Impaired perceived functional ability  Visit Diagnosis: Stiffness of right wrist, not elsewhere classified  Pain in right arm  Stiffness of right hand, not elsewhere classified  Other disturbances of skin sensation  Scar condition and fibrosis of skin  Muscle weakness (generalized)    Problem List There are no active problems to  display for this patient.   Rosalyn Gess OTR/L,CLT 03/28/2017, 7:48 PM  [facility] [address], [place], [address] Phone: [phone]   Fax:  [phone]  Name: NIEL PERETTI MRN: [id number] Date of Birth: November 12, 1975

## 2017-04-03 DIAGNOSIS — S68411D Complete traumatic amputation of right hand at wrist level, subsequent encounter: Secondary | ICD-10-CM | POA: Diagnosis not present

## 2017-04-05 ENCOUNTER — Ambulatory Visit: Payer: 59 | Admitting: Occupational Therapy

## 2017-04-05 DIAGNOSIS — M25641 Stiffness of right hand, not elsewhere classified: Secondary | ICD-10-CM | POA: Diagnosis not present

## 2017-04-05 DIAGNOSIS — M79601 Pain in right arm: Secondary | ICD-10-CM

## 2017-04-05 DIAGNOSIS — M6281 Muscle weakness (generalized): Secondary | ICD-10-CM | POA: Diagnosis not present

## 2017-04-05 DIAGNOSIS — M25631 Stiffness of right wrist, not elsewhere classified: Secondary | ICD-10-CM

## 2017-04-05 DIAGNOSIS — L905 Scar conditions and fibrosis of skin: Secondary | ICD-10-CM | POA: Diagnosis not present

## 2017-04-05 DIAGNOSIS — R208 Other disturbances of skin sensation: Secondary | ICD-10-CM

## 2017-04-05 NOTE — Patient Instructions (Signed)
Increase to 5 lbs for wrist RD, UD , sup and pronation -  Cont with FMC , putty, use , nerve glide, thumb exercises  Scar

## 2017-04-05 NOTE — Therapy (Signed)
[facility] [address], [place], [address] Phone: [phone]   Fax:  [phone]  Occupational Therapy Treatment  Patient Details  Name: Francisco Johns MRN: [id number] Date of Birth: Jun 13, 1975 Referring Provider (Historical): molnar   Encounter Date: 04/05/2017  OT End of Session - 04/05/17 1558    Visit Number  64    Number of Visits  82    Date for OT Re-Evaluation  05/16/17    OT Start Time  1056    OT Stop Time  1210    OT Time Calculation (min)  74 min    Activity Tolerance  Patient tolerated treatment well    Behavior During Therapy  [facility] for tasks assessed/performed       Past Medical History:  Diagnosis Date  . Metatarsal fracture    right big toe    Past Surgical History:  Procedure Laterality Date  . ankle surgery    . NO PAST SURGERIES    . ORIF TOE FRACTURE Right 12/09/2015   Procedure: OPEN REDUCTION INTERNAL FIXATION (ORIF) METATARSAL (TOE) FRACTURE, first metatarsal;  Surgeon: Renette Butters, MD;  Location: [facility];  Service: Orthopedics;  Laterality: Right;    There were no vitals filed for this visit.  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  No/denies                   OT Treatments/Exercises (OP) - 04/05/17 0001      RUE Fluidotherapy   Number Minutes  Fluidotherapy  10 Minutes    RUE Fluidotherapy Location  Hand;Wrist    Comments  decrease stiffness in wrist and digits        After fluido  Scar massage using vibration proximal to graft - and 2 most Adhere spots with muscle contraction - on harder fibrotic area - and scar adhesion - with thumb PA and RA  xtrator on scar adhesion proximal 10 x andWrist in extention and digits in extention and flexionAnd distal on graft 6 times -ROM gripping and thumb PA done this date - no irritation with extractor Composite ulnar and med N glide - by OT and on wall - cont to do at home  Pull in distal palm and 3rd digit  And well as thumb and forearm  To only 5 reps at time     Tool 502( small) for wrist flexion and extention - grip with ulnar 3 digit and coban strap 5th and 4th into flexion - wrist extention and flexion-120sec each  At2lbs 502 for sup and pronation - 2 lbs - 120 sec   Gripper - 25lbs 120 sec - flexion strap 5th and 4th  into flexion-Work on end range for grip  And gripper at 25  lbs for full grip - no wrapping  Lat grip -no taping - 25 lbs  120 sec  5 lbs weight - sup and support in pronation - 10 reps   RD and UD - 10 reps   at side - cannot hold up in neutral for extention of wrist           OT Education - 04/05/17 1558    Education provided  Yes    Education Details  nerve glde, composite extention stretch - 5 lbs for wrist RD< UD, Sup/pro    Person(s) Educated  Patient    Methods  Explanation;Demonstration    Comprehension  Verbalized understanding;Returned demonstration;Need further instruction       OT Short Term Goals - 01/25/17 1047      OT SHORT TERM GOAL #1   Title  Pt to be ind in HEP to use splints correctly , increase ROM and sensatin in R hand and wrist     Status  Achieved      OT SHORT TERM GOAL #2   Title  Fabricate/ modify or change splints as needed to prevent contractures in R hand and wrist while nerve  healing occur    Status  Achieved      OT SHORT TERM GOAL #3   Title  R wrist AROM and PROM improve with 10-15 degrees to be able carry object on palm, wipe table and reach with wrist extention to grip     Baseline  improving but not able to do all act     Time  4    Period  Weeks    Status  On-going    Target Date  02/22/17      OT SHORT TERM GOAL #4   Title  Sensation improve in R hand to identify  ojbects in palm 50% of time     Baseline  Semmes Weinstein Radial Nerve normal dorsal and except distal to MP of thumb , 4.56 volar hand except DIP and PIP of 5th , and 4.31 hand except volar 5th     Time  4    Period  Weeks    Status  On-going    Target Date  03/15/17        OT Long Term Goals - 01/25/17 1049      OT LONG TERM GOAL #1   Title  Upgrade HEP to increase PIP and MC flexion as nerve is healing to grasp 6 inch object to 3 inch object    Baseline  can use lat grip but not 3 point or full tight grip     Time  4    Period  Weeks    Status  On-going    Target Date  02/22/17      OT LONG TERM GOAL #2   Title  Wrist ROM improve and strength for pt to intiate using 1 lbs weight for wrist in all planes     Baseline  using 3 lbs for wrist     Status  Achieved      OT LONG TERM GOAL #3   Title  Assess shoulder and upgrade HEP to decrease pain and compensation     Status  Achieved      OT LONG TERM GOAL #4   Title  Pt to use 5 lbs at wrist and elbow without pain or discomfort to use in ADL's and IADL's  Baseline  3 lbs all planes     Time  4    Period  Weeks    Status  On-going    Target Date  02/15/17            Plan - 04/05/17 1559    Clinical Impression Statement  Pt seen surgeon last week - progressing still - pt report he can tell his hand is stronger - but scar tissue holding movement back - and pinkie still not able to do flexion except when block at PIP - tight in composite extention - scar tissue still and nerve still progressing     Occupational  performance deficits (Please refer to evaluation for details):  ADL's;IADL's;Play;Leisure;Work    Rehab Potential  Good    OT Frequency  1x / week    OT Duration  4 weeks    OT Treatment/Interventions  Self-care/ADL training;Fluidtherapy;Splinting;Therapeutic exercise;Passive range of motion;Manual Therapy;Scar mobilization    Plan  upgrade as needed and do fluidotherapy to icnrease flexibility at wrist  -assess grip and prehension    Clinical Decision Making  Multiple treatment options, significant modification of task necessary    OT Home Exercise Plan  see pt instruction    Consulted and Agree with Plan of Care  Patient       Patient will benefit from skilled therapeutic intervention in order to improve the following deficits and impairments:  Decreased range of motion, Impaired flexibility, Decreased coordination, Decreased safety awareness, Increased edema, Impaired sensation, Decreased skin integrity, Decreased knowledge of precautions, Decreased knowledge of use of DME, Decreased scar mobility, Impaired UE functional use, Pain, Decreased strength, Impaired perceived functional ability  Visit Diagnosis: Stiffness of right wrist, not elsewhere classified  Pain in right arm  Stiffness of right hand, not elsewhere classified  Other disturbances of skin sensation  Scar condition and fibrosis of skin  Muscle weakness (generalized)    Problem List There are no active problems to display for this patient.   Rosalyn Gess OTR/L,CLT  04/05/2017, 4:11 PM  [facility] [address] [place], [address] Phone: [phone]   Fax:  [phone]  Name: Francisco Johns MRN: [id number] Date of Birth: Dec 30, 1975

## 2017-04-10 ENCOUNTER — Ambulatory Visit: Payer: 59 | Admitting: Occupational Therapy

## 2017-04-10 DIAGNOSIS — L905 Scar conditions and fibrosis of skin: Secondary | ICD-10-CM | POA: Diagnosis not present

## 2017-04-10 DIAGNOSIS — R208 Other disturbances of skin sensation: Secondary | ICD-10-CM | POA: Diagnosis not present

## 2017-04-10 DIAGNOSIS — M6281 Muscle weakness (generalized): Secondary | ICD-10-CM

## 2017-04-10 DIAGNOSIS — M79601 Pain in right arm: Secondary | ICD-10-CM

## 2017-04-10 DIAGNOSIS — M25641 Stiffness of right hand, not elsewhere classified: Secondary | ICD-10-CM

## 2017-04-10 DIAGNOSIS — M25631 Stiffness of right wrist, not elsewhere classified: Secondary | ICD-10-CM | POA: Diagnosis not present

## 2017-04-10 NOTE — Therapy (Signed)
[facility] [address], [place], [address] Phone: [phone]   Fax:  [phone]  Occupational Therapy Treatment  Patient Details  Name: Francisco Johns MRN: [id number] Date of Birth: Apr 08, 1976 Referring Provider (Historical): molnar   Encounter Date: 04/10/2017  OT End of Session - 04/10/17 2227    Visit Number  65    Number of Visits  66    Date for OT Re-Evaluation  05/16/17    OT Start Time  1046    OT Stop Time  1150    OT Time Calculation (min)  64 min    Activity Tolerance  Patient tolerated treatment well    Behavior During Therapy  [facility] for tasks assessed/performed       Past Medical History:  Diagnosis Date  . Metatarsal fracture    right big toe    Past Surgical History:  Procedure Laterality Date  . ankle surgery    . NO PAST SURGERIES    . ORIF TOE FRACTURE Right 12/09/2015   Procedure: OPEN REDUCTION INTERNAL FIXATION (ORIF) METATARSAL (TOE) FRACTURE, first metatarsal;  Surgeon: Renette Butters, MD;  Location: [facility];  Service: Orthopedics;  Laterality: Right;    There were no vitals filed for this visit.  Subjective Assessment - 04/10/17 2221    Subjective   I am trying to use it more - my hand definetly stays more cold not that it is winter - I am doing the 4 lbs weight for my wrist - but did not do the putty or thumb HEP for while     Patient Stated Goals  I want as much movement and use I can get for my R dominant hand- to work , play with my kids, fish , play golf ,    Currently in Pain?  No/denies         [facility] OT Assessment - 04/10/17 0001      Strength   Right Hand Grip (lbs)  20 Benik 21    Right Hand Lateral Pinch  6 lbs      Right Hand AROM   R Index  MCP 0-90  90 Degrees    R Index PIP 0-100  100 Degrees    R Long  MCP 0-90  85 Degrees    R Long PIP 0-100  100 Degrees    R Ring  MCP 0-90  90 Degrees    R Ring PIP 0-100  95 Degrees        Assess progress in digits , wrist ROM  Grip and prehension  And sensation - see flowsheet and clinical impresssion   Review what pt is doing at home  And then establish new HEP with hand out :   Composite extention mobs on wall for R UE  Pronation and sup stretch  Prior to 4 lbs for Sup/pro. RD and UD 1-12 reps and increase sets  YTB for place and hold wrist extention neutral with open and close hand  10 reps - increase sets gradually   Can use 5 lbs for wrist too - just wrap 5th and 4th digits   Gripping putty - stabilze wrist neutral  3 point and 2 point pinch in putty  Key grip - but tape thumb out into PA  And not over flexion thumb IP  Increase reps - quality more importance   Pushing with finger tips on wall - keeping arch of hand /palm  Increase 30 Sec to 1 min x 3  Place and hold thumb PA and RA - rubber band - 12 reps and increase sets  Manipulation of small ball for Fingers <> palm - with thumb taped into opposition   Cont with scar massage and kinesio taping              OT Treatments/Exercises (OP) - 04/10/17 0001      RUE Fluidotherapy   Number Minutes Fluidotherapy  10 Minutes    RUE Fluidotherapy Location  Hand;Wrist    Comments  decrease stiffness in digits and wrist prior to review of HEP              OT Education - 04/10/17 2227    Education provided  Yes    Education Details  progress assess -an new HEP establish     Person(s) Educated  Patient    Methods  Explanation;Demonstration;Tactile cues;Verbal cues;Handout    Comprehension  Verbalized understanding;Need further instruction;Returned demonstration;Verbal cues required       OT Short Term Goals - 04/10/17 2232      OT SHORT TERM GOAL #1   Title  Pt to be ind in HEP to use splints correctly , increase ROM and sensatin in R hand and wrist     Status  Achieved      OT SHORT TERM GOAL #2   Title  Fabricate/ modify or change splints as needed to prevent contractures in R  hand and wrist while nerve healing occur    Status  Achieved      OT SHORT TERM GOAL #3   Title  R wrist AROM and PROM improve with 10-15 degrees to be able carry object on palm, wipe table and reach with wrist extention to grip     Baseline  able to do first 2 but still some colapsing of wrist during gripping or carrying more than 4 lbs     Status  On-going    Target Date  05/01/17      OT SHORT TERM GOAL #4   Title  Sensation improve in R hand to identify  ojbects in palm 50% of time     Baseline  Semmes Weinstein improve to 3.61 in palm , 4.31 in thumb and 5th digit     Time  4    Period  Weeks    Status  On-going    Target Date  05/08/17        OT Long Term Goals - 04/10/17 2235      OT LONG TERM GOAL #1   Title  Upgrade HEP to increase PIP and MC flexion as nerve is healing to grasp 6 inch object to 3 inch object    Baseline  can use lat grip but not 3 point  ( only on 3 inch object) - but can do 2 point grip on small     Time  4    Period  Weeks    Status  On-going    Target Date  05/08/17      OT LONG TERM GOAL #2   Title  Wrist ROM improve and strength for pt to intiate using 1 lbs weight for wrist in all planes     Baseline  using 4 lbs - but increase wrist flexion - and weakness with 5lbs     Time  4    Period  Weeks    Status  On-going    Target Date  05/15/17  OT LONG TERM GOAL #3   Title  Assess shoulder and upgrade HEP to decrease pain and compensation     Status  Achieved      OT LONG TERM GOAL #4   Title  Pt to use 5 lbs at wrist and elbow without pain or discomfort to use in ADL's and IADL's     Baseline  using 4 lbs with some collapsing of wrist into flexion     Time  4    Period  Weeks    Status  On-going    Target Date  05/08/17            Plan - 04/10/17 2228    Clinical Impression Statement  Pt show increase sensation wiht 3.61 in thumb  , palm and 5th digit  4.31 - one level improve - pt able to identify - accurate 50% feels more  distal sometimes- increase grip strength this date and increase thumb /lat pinch - cont with nerves healing , and scar tissue - pt decrease to every other week - re done pt's HEP this date what to focus on     Occupational performance deficits (Please refer to evaluation for details):  ADL's;IADL's;Play;Leisure;Work    Rehab Potential  Good    OT Frequency  Biweekly    OT Duration  4 weeks    OT Treatment/Interventions  Self-care/ADL training;Fluidtherapy;Splinting;Therapeutic exercise;Passive range of motion;Manual Therapy;Scar mobilization    Plan  assess progress with HEP the 2 wks at home     Clinical Decision Making  Multiple treatment options, significant modification of task necessary    OT Home Exercise Plan  see pt instruction    Consulted and Agree with Plan of Care  Patient       Patient will benefit from skilled therapeutic intervention in order to improve the following deficits and impairments:  Decreased range of motion, Impaired flexibility, Decreased coordination, Decreased safety awareness, Increased edema, Impaired sensation, Decreased skin integrity, Decreased knowledge of precautions, Decreased knowledge of use of DME, Decreased scar mobility, Impaired UE functional use, Pain, Decreased strength, Impaired perceived functional ability  Visit Diagnosis: Stiffness of right wrist, not elsewhere classified  Pain in right arm  Stiffness of right hand, not elsewhere classified  Other disturbances of skin sensation  Scar condition and fibrosis of skin  Muscle weakness (generalized)    Problem List There are no active problems to display for this patient.   Rosalyn Gess OTR/L,CLT 04/10/2017, 10:37 PM  [facility] [address], [place], [address] Phone: [phone]   Fax:  [phone]  Name: GRADY MOHABIR MRN: [id number] Date of Birth: [birth year]

## 2017-04-10 NOTE — Patient Instructions (Signed)
Pt frequency decrease :  Review HEP  Composite extention mobs on wall for R UE  Pronation and sup stretch  Prior to 4 lbs for Sup/pro. RD and UD 1-12 reps and increase sets  YTB for place and hold wrist extention neutral with open and close hand  10 reps - increase sets gradually   Can use 5 lbs for wrist too - just wrap 5th and 4th digits   Gripping putty - stabilze wrist neutral  3 point and 2 point pinch in putty  Key grip - but tape thumb out into PA  And not over flexion thumb IP  Increase reps - quality more importance   Pushing with finger tips on wall - keeping arch of hand /palm  Increase 30 Sec to 1 min x 3  Place and hold thumb PA and RA - rubber band - 12 reps and increase sets  Manipulation of small ball for Fingers <> palm - with thumb taped into opposition

## 2017-04-25 ENCOUNTER — Ambulatory Visit: Payer: 59 | Attending: Plastic Surgery | Admitting: Occupational Therapy

## 2017-04-25 DIAGNOSIS — R208 Other disturbances of skin sensation: Secondary | ICD-10-CM | POA: Insufficient documentation

## 2017-04-25 DIAGNOSIS — M25631 Stiffness of right wrist, not elsewhere classified: Secondary | ICD-10-CM | POA: Diagnosis not present

## 2017-04-25 DIAGNOSIS — M6281 Muscle weakness (generalized): Secondary | ICD-10-CM | POA: Diagnosis not present

## 2017-04-25 DIAGNOSIS — M79601 Pain in right arm: Secondary | ICD-10-CM | POA: Insufficient documentation

## 2017-04-25 DIAGNOSIS — M25641 Stiffness of right hand, not elsewhere classified: Secondary | ICD-10-CM | POA: Insufficient documentation

## 2017-04-25 DIAGNOSIS — L905 Scar conditions and fibrosis of skin: Secondary | ICD-10-CM | POA: Diagnosis not present

## 2017-04-25 NOTE — Therapy (Signed)
[facility] [address], [place], [address] Phone: [phone]   Fax:  [phone]  Occupational Therapy Treatment  Patient Details  Name: Francisco Johns MRN: [id number] Date of Birth: 1975-05-25 Referring Provider (Historical): molnar   Encounter Date: 04/25/2017  OT End of Session - 04/25/17 1800    Visit Number  75    Number of Visits  71    Date for OT Re-Evaluation  05/16/17    OT Start Time  1005    OT Stop Time  1059    OT Time Calculation (min)  54 min    Activity Tolerance  Patient tolerated treatment well    Behavior During Therapy  [facility] for tasks assessed/performed       Past Medical History:  Diagnosis Date  . Metatarsal fracture    right big toe    Past Surgical History:  Procedure Laterality Date  . ankle surgery    . NO PAST SURGERIES    . ORIF TOE FRACTURE Right 12/09/2015   Procedure: OPEN REDUCTION INTERNAL FIXATION (ORIF) METATARSAL (TOE) FRACTURE, first metatarsal;  Surgeon: Renette Butters, MD;  Location: [facility];  Service: Orthopedics;  Laterality: Right;    There were no vitals filed for this visit.  Subjective Assessment - 04/25/17 1755    Subjective    I done the exercises every day - and using it more - I can tell I am stronger in my grip , picking up object, or carrying things - tying my kids shoes with both hands - and more pins and needles in my hand not as numb - but I maybe over did things the last 4 days - because I have some pain at my elbow  some what     Patient Stated Goals  I want as much movement and use I can get for my R dominant hand- to work , play with my kids, fish , play golf ,    Currently in Pain?  Yes    Pain Score  2     Pain Location  Elbow    Pain Orientation  Right    Pain Descriptors / Indicators  Tender    Pain Onset  In the past 7 days       Pt pain with wrist extention resistance at lat epicondyle and tender  Assess  digits AROM , MC flexion with extention  And extention  Lat grip and 3 point   still collapsing into add with lat grip loaded   [facility] OT Assessment - 04/25/17 0001      Strength   Right Hand Grip (lbs)  20                 OT Treatments/Exercises (OP) - 04/25/17 0001      Ultrasound   Ultrasound Location  R lat epicondyle     Ultrasound Parameters  3.3 MHZ, 20% , 1.0 intensity  for 4 min at end of session     Ultrasound Goals  Pain      RUE Fluidotherapy   Number Minutes Fluidotherapy  10 Minutes    RUE Fluidotherapy Location  Hand;Wrist    Comments  prior to ROM , and heatinpad at lat epicondyle        Cross friction massage done and pt ed on doing at home  And graston tool nr 2 for sweeping over extensors of forearm  Scar massage done - using brushing  wit nr 2 on proximal part and where adhere - with pt gripping to contract muscle under graft  And wrist extensor stretch - 5 reps hold 5 sec extended arm review Ice massage over lat epicondyle  2 x day  But in clinic done [place]   Review HEP what to hold off on and what to add           OT Education - 04/25/17 1800    Education provided  Yes    Education Details  hold off on wrist ext, sup and gripping and do treatment for elbow     Person(s) Educated  Patient    Methods  Explanation;Demonstration;Tactile cues;Verbal cues;Handout    Comprehension  Verbalized understanding;Returned demonstration;Verbal cues required       OT Short Term Goals - 04/10/17 2232      OT SHORT TERM GOAL #1   Title  Pt to be ind in HEP to use splints correctly , increase ROM and sensatin in R hand and wrist     Status  Achieved      OT SHORT TERM GOAL #2   Title  Fabricate/ modify or change splints as needed to prevent contractures in R hand and wrist while nerve healing occur    Status  Achieved      OT SHORT TERM GOAL #3   Title  R wrist AROM and PROM improve with 10-15 degrees to be able carry object on palm, wipe table and  reach with wrist extention to grip     Baseline  able to do first 2 but still some colapsing of wrist during gripping or carrying more than 4 lbs     Status  On-going    Target Date  05/01/17      OT SHORT TERM GOAL #4   Title  Sensation improve in R hand to identify  ojbects in palm 50% of time     Baseline  Semmes Weinstein improve to 3.61 in palm , 4.31 in thumb and 5th digit     Time  4    Period  Weeks    Status  On-going    Target Date  05/08/17        OT Long Term Goals - 04/10/17 2235      OT LONG TERM GOAL #1   Title  Upgrade HEP to increase PIP and MC flexion as nerve is healing to grasp 6 inch object to 3 inch object    Baseline  can use lat grip but not 3 point  ( only on 3 inch object) - but can do 2 point grip on small     Time  4    Period  Weeks    Status  On-going    Target Date  05/08/17      OT LONG TERM GOAL #2   Title  Wrist ROM improve and strength for pt to intiate using 1 lbs weight for wrist in all planes     Baseline  using 4 lbs - but increase wrist flexion - and weakness with 5lbs     Time  4    Period  Weeks    Status  On-going    Target Date  05/15/17      OT LONG TERM GOAL #3   Title  Assess shoulder and upgrade HEP to decrease pain and compensation     Status  Achieved      OT LONG TERM GOAL #4   Title  Pt  to use 5 lbs at wrist and elbow without pain or discomfort to use in ADL's and IADL's     Baseline  using 4 lbs with some collapsing of wrist into flexion     Time  4    Period  Weeks    Status  On-going    Target Date  05/08/17            Plan - 04/25/17 1801    Clinical Impression Statement  Pt report and show increase wrist extnetion and stabilization of wrist during gripping but because of pain at lat epicondyle did not show it with grip strength - pt report increase functional use with grip and lat grip - pt to cont with same HEP but hold off for week on wrist exte, sup , gripping and add some lat epicondyle treatment and pt  to phone me in week     Occupational performance deficits (Please refer to evaluation for details):  ADL's;IADL's;Play;Leisure;Work    Rehab Potential  Good    OT Frequency  Biweekly    OT Duration  4 weeks    OT Treatment/Interventions  Self-care/ADL training;Fluidtherapy;Splinting;Therapeutic exercise;Passive range of motion;Manual Therapy;Scar mobilization    Plan  Pt to phone me in week about elbow pain     Clinical Decision Making  Multiple treatment options, significant modification of task necessary    OT Home Exercise Plan  see pt instruction    Consulted and Agree with Plan of Care  Patient       Patient will benefit from skilled therapeutic intervention in order to improve the following deficits and impairments:  Decreased range of motion, Impaired flexibility, Decreased coordination, Decreased safety awareness, Increased edema, Impaired sensation, Decreased skin integrity, Decreased knowledge of precautions, Decreased knowledge of use of DME, Decreased scar mobility, Impaired UE functional use, Pain, Decreased strength, Impaired perceived functional ability  Visit Diagnosis: Stiffness of right wrist, not elsewhere classified  Pain in right arm  Stiffness of right hand, not elsewhere classified  Other disturbances of skin sensation  Scar condition and fibrosis of skin  Muscle weakness (generalized)    Problem List There are no active problems to display for this patient.   Rosalyn Gess  OTR/L,CLT  04/25/2017, 6:05 PM  [facility] [address] [place], [address] Phone: [phone]   Fax:  [phone]  Name: Francisco Johns MRN: [id number] Date of Birth: 10/22/1975

## 2017-04-25 NOTE — Patient Instructions (Signed)
Same as last but hold off for week on gripping putty  And any wrist extention and supination  Phone OT in week   Do some heat on elbow  Cross friction massage  And wrist extensor stretch - 5 reps hold 5 sec extended arm  Ice massage over lat epicondyle  2 x day

## 2017-05-06 DIAGNOSIS — Z08 Encounter for follow-up examination after completed treatment for malignant neoplasm: Secondary | ICD-10-CM | POA: Diagnosis not present

## 2017-05-06 DIAGNOSIS — Z85820 Personal history of malignant melanoma of skin: Secondary | ICD-10-CM | POA: Diagnosis not present

## 2017-05-06 DIAGNOSIS — Z1283 Encounter for screening for malignant neoplasm of skin: Secondary | ICD-10-CM | POA: Diagnosis not present

## 2017-05-16 ENCOUNTER — Ambulatory Visit: Payer: 59 | Admitting: Occupational Therapy

## 2017-05-16 DIAGNOSIS — M6281 Muscle weakness (generalized): Secondary | ICD-10-CM

## 2017-05-16 DIAGNOSIS — R208 Other disturbances of skin sensation: Secondary | ICD-10-CM | POA: Diagnosis not present

## 2017-05-16 DIAGNOSIS — M25631 Stiffness of right wrist, not elsewhere classified: Secondary | ICD-10-CM | POA: Diagnosis not present

## 2017-05-16 DIAGNOSIS — L905 Scar conditions and fibrosis of skin: Secondary | ICD-10-CM | POA: Diagnosis not present

## 2017-05-16 DIAGNOSIS — M25641 Stiffness of right hand, not elsewhere classified: Secondary | ICD-10-CM | POA: Diagnosis not present

## 2017-05-16 DIAGNOSIS — M79601 Pain in right arm: Secondary | ICD-10-CM | POA: Diagnosis not present

## 2017-05-16 NOTE — Therapy (Signed)
[facility] [address] [place], [address] Phone: [phone]   Fax:  [phone]  Occupational Therapy Treatment  Patient Details  Name: Francisco Johns MRN: [id number] Date of Birth: [birth year] No Data Recorded  Encounter Date: 05/16/2017  OT End of Session - 05/16/17 1158    Visit Number  32    Number of Visits  74    Date for OT Re-Evaluation  08/15/17    OT Start Time  0910    OT Stop Time  1028    OT Time Calculation (min)  78 min    Activity Tolerance  Patient tolerated treatment well    Behavior During Therapy  [facility] for tasks assessed/performed       Past Medical History:  Diagnosis Date  . Metatarsal fracture    right big toe    Past Surgical History:  Procedure Laterality Date  . ankle surgery    . NO PAST SURGERIES    . ORIF TOE FRACTURE Right 12/09/2015   Procedure: OPEN REDUCTION INTERNAL FIXATION (ORIF) METATARSAL (TOE) FRACTURE, first metatarsal;  Surgeon: Renette Butters, MD;  Location: [place];  Service: Orthopedics;  Laterality: Right;    There were no vitals filed for this visit.  Subjective Assessment - 05/16/17 1157    Subjective   Doing okay - my index finger stronger - and more straight - the middle one still wants to claw -and feeling maybe litte better and grip - and the pain at my elbow is better - do not feel it anymore     Patient Stated Goals  I want as much movement and use I can get for my R dominant hand- to work , play with my kids, fish , play golf ,    Currently in Pain?  No/denies         [facility] OT Assessment - 05/16/17 0001      Strength   Right Hand Grip (lbs)  23    Right Hand Lateral Pinch  8 lbs        assess function in thumb and digits - flexion a, extention , pinching  Grip and prehension  -see flowsheet  And sensation - see flowsheet and clinical impresssion   Review what pt is doing at home  And then establish new HEP  with hand out :   Composite extention mobs on wall for R UE  Pronation and sup stretch  - made some progress 10 degrees on pronatoin  Prior to 4 lbs for Sup/pro. RD and UD 1-12 reps and increase sets   10 reps - increase sets gradually  Stop using yellow band and 5 lbs for wrist - pt had some lat epicondyle symptoms last time - but better     Gripping putty - stabilze wrist neutral  = upgrade to teal  3 point and 2 point pinch in putty  - doing 2 poin pinch also with 3rd dig  Key grip - but tape thumb out into PA  And not over flexion thumb IP  Increase reps - quality more importance   Pushing with finger tips on wall - keeping arch of hand /palm  Increase 30 Sec to 1 min x 3  Place and hold thumb PA and RA - rubber band - 12 reps and increase sets  - but isolated MP and do RA with palm on talble  Do more sessions but lower reps  Manipulation of  small ball for Fingers <> palm - with thumb taped into opposition  And add cards and paper towel roll too - to faciliate thumb PA   Cont with scar massage and kinesio taping   3rd digit show increase flexor contracture - fabricated night finger splint to use                  OT Education - 05/16/17 1158    Education provided  Yes    Education Details  Progress discuss and modify HEP     Person(s) Educated  Patient    Methods  Demonstration;Tactile cues;Verbal cues;Handout    Comprehension  Returned demonstration;Verbal cues required       OT Short Term Goals - 05/16/17 1202      OT SHORT TERM GOAL #1   Title  Pt to be ind in HEP to use splints correctly , increase ROM and sensatin in R hand and wrist     Status  Achieved      OT SHORT TERM GOAL #2   Title  Fabricate/ modify or change splints as needed to prevent contractures in R hand and wrist while nerve healing occur    Baseline  still wearing webspacer and fabricated this date digit extention splint for R 3rd     Time  8    Period  Weeks    Status  On-going     Target Date  07/11/17      OT SHORT TERM GOAL #3   Title  R wrist AROM and PROM improve with 10-15 degrees to be able carry object on palm, wipe table and reach with wrist extention to grip     Baseline  able to do first 2 but still some colapsing of wrist during gripping or carrying more than 4 lbs     Time  8    Period  Weeks    Status  On-going    Target Date  07/04/17      OT SHORT TERM GOAL #4   Title  Sensation improve in R hand to identify  ojbects in palm 50% of time     Baseline  3.61 this date except DIP of 3rd thru 5th     Time  8    Period  Weeks    Status  On-going    Target Date  07/11/17        OT Long Term Goals - 05/16/17 1205      OT LONG TERM GOAL #1   Title  Upgrade HEP to increase PIP and MC flexion as nerve is healing to grasp 6 inch object to 3 inch object    Baseline  can use lat grip but not 3 point  ( only on 3 inch object) - but can do 2 point grip on small     Time  12    Period  Weeks    Status  On-going    Target Date  08/15/17      OT LONG TERM GOAL #2   Title  Wrist ROM improve and strength for pt to intiate using 1 lbs weight for wrist in all planes     Status  Achieved      OT LONG TERM GOAL #3   Title  Assess shoulder and upgrade HEP to decrease pain and compensation     Status  Achieved      OT LONG TERM GOAL #4   Title  Pt to use 5 lbs at  wrist and elbow without pain or discomfort to use in ADL's and IADL's     Baseline  using 4 lbs with  RD, UD, Sup and pron some collapsing of wrist into flexion  and 2 for wrist extention     Time  12    Period  Weeks    Status  On-going    Target Date  08/15/17            Plan - 05/16/17 1159    Clinical Impression Statement  Pt is being seen now 1 x month - pt since last time made progress in sensation now - 3.61 on R hand excpet DIP of  3rd, 4th and 5th - grip increase 3 lbs and lat grip 2 lbs - pt still show increase flexore contracture at 3rd digit, collapsing into wrist flexion  with gripping , wrist extention still weak and cannot do endrange - FMC still decrease - and thumb RA - cont 1 x month     Occupational performance deficits (Please refer to evaluation for details):  ADL's;IADL's;Play;Leisure;Work    Rehab Potential  Good    OT Frequency  Monthly    OT Duration  12 weeks    OT Treatment/Interventions  Self-care/ADL training;Fluidtherapy;Splinting;Therapeutic exercise;Passive range of motion;Manual Therapy;Scar mobilization    Plan  upgrade HEP as needed and assess progress the past month    Clinical Decision Making  Multiple treatment options, significant modification of task necessary    OT Home Exercise Plan  see pt instruction    Consulted and Agree with Plan of Care  Patient       Patient will benefit from skilled therapeutic intervention in order to improve the following deficits and impairments:  Decreased range of motion, Impaired flexibility, Decreased coordination, Decreased safety awareness, Increased edema, Impaired sensation, Decreased skin integrity, Decreased knowledge of precautions, Decreased knowledge of use of DME, Decreased scar mobility, Impaired UE functional use, Pain, Decreased strength, Impaired perceived functional ability  Visit Diagnosis: Stiffness of right wrist, not elsewhere classified  Pain in right arm  Stiffness of right hand, not elsewhere classified  Other disturbances of skin sensation  Scar condition and fibrosis of skin  Muscle weakness (generalized)    Problem List There are no active problems to display for this patient.   Rosalyn Gess OTR/L,CLT 05/16/2017, 12:07 PM  [facility] [address], [place], [address] Phone: [phone]   Fax:  [phone]  Name: ARATH KAIGLER MRN: [id number] Date of Birth: June 30, 1975

## 2017-05-16 NOTE — Addendum Note (Signed)
Addended by: Rosalyn Gess on: 05/16/2017 02:11 PM   Modules accepted: Orders

## 2017-05-17 DIAGNOSIS — S58111D Complete traumatic amputation at level between elbow and wrist, right arm, subsequent encounter: Secondary | ICD-10-CM | POA: Diagnosis not present

## 2017-05-17 DIAGNOSIS — Z4789 Encounter for other orthopedic aftercare: Secondary | ICD-10-CM | POA: Diagnosis not present

## 2017-05-17 DIAGNOSIS — S58111A Complete traumatic amputation at level between elbow and wrist, right arm, initial encounter: Secondary | ICD-10-CM | POA: Diagnosis not present

## 2017-06-13 ENCOUNTER — Ambulatory Visit: Payer: 59 | Attending: Plastic Surgery | Admitting: Occupational Therapy

## 2017-06-13 DIAGNOSIS — M25641 Stiffness of right hand, not elsewhere classified: Secondary | ICD-10-CM | POA: Insufficient documentation

## 2017-06-13 DIAGNOSIS — M6281 Muscle weakness (generalized): Secondary | ICD-10-CM | POA: Insufficient documentation

## 2017-06-13 DIAGNOSIS — M79601 Pain in right arm: Secondary | ICD-10-CM | POA: Insufficient documentation

## 2017-06-13 DIAGNOSIS — M25631 Stiffness of right wrist, not elsewhere classified: Secondary | ICD-10-CM | POA: Insufficient documentation

## 2017-06-13 DIAGNOSIS — R208 Other disturbances of skin sensation: Secondary | ICD-10-CM | POA: Insufficient documentation

## 2017-06-13 DIAGNOSIS — L905 Scar conditions and fibrosis of skin: Secondary | ICD-10-CM | POA: Insufficient documentation

## 2017-06-13 NOTE — Therapy (Signed)
[facility] [address] [place], [address] Phone: [phone]   Fax:  [phone]  Occupational Therapy Treatment  Patient Details  Name: Francisco Johns MRN: [id number] Date of Birth: Jul 20, 1975 No Data Recorded  Encounter Date: 06/13/2017  OT End of Session - 06/13/17 1355    Visit Number  59    Number of Visits  70    Date for OT Re-Evaluation  08/15/17    OT Start Time  1130    OT Stop Time  1225    OT Time Calculation (min)  55 min    Activity Tolerance  Patient tolerated treatment well    Behavior During Therapy  [facility] for tasks assessed/performed       Past Medical History:  Diagnosis Date  . Metatarsal fracture    right big toe    Past Surgical History:  Procedure Laterality Date  . ankle surgery    . NO PAST SURGERIES    . ORIF TOE FRACTURE Right 12/09/2015   Procedure: OPEN REDUCTION INTERNAL FIXATION (ORIF) METATARSAL (TOE) FRACTURE, first metatarsal;  Surgeon: Renette Butters, MD;  Location: [place];  Service: Orthopedics;  Laterality: Right;    There were no vitals filed for this visit.  Subjective Assessment - 06/13/17 1344    Subjective   I have seen the ortho MD last month and he released me - said on xray some xtra bone between ends of my 2 wrist bones - will explain not full motion for rotation - using it more - can tell getting stronger - and thumb moving better - but my pinkie the Dr would need to do something with - it sticks out and I am going to break it -not as much pins and  needles but feels asleep - my top of hand worse     Patient Stated Goals  I want as much movement and use I can get for my R dominant hand- to work , play with my kids, fish , play golf ,    Currently in Pain?  No/denies         [facility] OT Assessment - 06/13/17 0001      Strength   Right Hand Grip (lbs)  23    Right Hand Lateral Pinch  8 lbs       assess function in thumb and  digits - flexion a, extention , pinching  Grip and prehension  -see flowsheet  And sensation - see flowsheet and clinical impresssion   Review what pt is doing at home  And then establish new HEP with hand out :  Composite extention mobs on wall for R UE  Pronation and sup stretch  - made some progress 10 degrees on pronatoin  Prior to 4 lbs for Sup/pro. RD and UD 1-12 reps and increase sets  But coban 5th digit into flexion - to not over grip and compensate with grip - aggravating his lat epicondyle  2lbs  Place and hold wrist extention neutral - coban wrap 5th digit - and only 6 reps - stop wen feeling strain at extensor mass    Gripping putty - stabilze wrist neutral  = upgrade to green putty  Use teal putty for  3 point and 2 point pinch in putty  - doing 2 poin only with 2nd  Key grip - but done have to tape thumb into PA - can keep it align this date stop if  notice over flexion thumb IP  Increase reps - quality more importance   Pushing with finger tips on wall - keeping arch of hand /palm - improving   1 min x 3  Place and hold thumb PA and RA - rubber band - 12 reps and increase sets  - but isolated MP and do RA with palm on talble  Do more sessions but lower reps  Manipulation of small ball for Fingers <> palm - with thumb taped into opposition And add cards and  Thicker paper towel roll too - to faciliate thumb PA   Cont with scar massage and kinesio taping                     OT Education - 06/13/17 1355    Education provided  Yes    Education Details  progress discuss and modifications to HEP     Person(s) Educated  Patient    Methods  Explanation;Demonstration    Comprehension  Verbalized understanding;Returned demonstration;Verbal cues required       OT Short Term Goals - 05/16/17 1202      OT SHORT TERM GOAL #1   Title  Pt to be ind in HEP to use splints correctly , increase ROM and sensatin in R hand and wrist     Status   Achieved      OT SHORT TERM GOAL #2   Title  Fabricate/ modify or change splints as needed to prevent contractures in R hand and wrist while nerve healing occur    Baseline  still wearing webspacer and fabricated this date digit extention splint for R 3rd     Time  8    Period  Weeks    Status  On-going    Target Date  07/11/17      OT SHORT TERM GOAL #3   Title  R wrist AROM and PROM improve with 10-15 degrees to be able carry object on palm, wipe table and reach with wrist extention to grip     Baseline  able to do first 2 but still some colapsing of wrist during gripping or carrying more than 4 lbs     Time  8    Period  Weeks    Status  On-going    Target Date  07/04/17      OT SHORT TERM GOAL #4   Title  Sensation improve in R hand to identify  ojbects in palm 50% of time     Baseline  3.61 this date except DIP of 3rd thru 5th     Time  8    Period  Weeks    Status  On-going    Target Date  07/11/17        OT Long Term Goals - 05/16/17 1205      OT LONG TERM GOAL #1   Title  Upgrade HEP to increase PIP and MC flexion as nerve is healing to grasp 6 inch object to 3 inch object    Baseline  can use lat grip but not 3 point  ( only on 3 inch object) - but can do 2 point grip on small     Time  12    Period  Weeks    Status  On-going    Target Date  08/15/17      OT LONG TERM GOAL #2   Title  Wrist ROM improve and strength for pt to intiate using 1 lbs weight for  wrist in all planes     Status  Achieved      OT LONG TERM GOAL #3   Title  Assess shoulder and upgrade HEP to decrease pain and compensation     Status  Achieved      OT LONG TERM GOAL #4   Title  Pt to use 5 lbs at wrist and elbow without pain or discomfort to use in ADL's and IADL's     Baseline  using 4 lbs with  RD, UD, Sup and pron some collapsing of wrist into flexion  and 2 for wrist extention     Time  12    Period  Weeks    Status  On-going    Target Date  08/15/17            Plan -  06/13/17 1357    Clinical Impression Statement  Pt seen about month ago - show increase thumb flexion , control but still weakness with PA and RA of composite thumb out of MP , no AROM of 5th PIP and DIP - more muscle bulk at volar scar - increase  grip and lat grip - but not showing on flowsheet becauase of IP flexion of thumb over power  thumb ABD and extensors , and wrist  collapse into flexion still with increase grip - sensation 3.61 on dorsal hand , but 4.31 over volar hand - and  3.61 still lagging on volar digits 2nd , 3rd and thumb - cont wit HEP for month - will follow up iwth Dr Vernona Rieger in 3 wks     Occupational performance deficits (Please refer to evaluation for details):  ADL's;IADL's;Play;Leisure;Work    Rehab Potential  Good    OT Frequency  Monthly    OT Duration  8 weeks    OT Treatment/Interventions  Self-care/ADL training;Fluidtherapy;Splinting;Therapeutic exercise;Passive range of motion;Manual Therapy;Scar mobilization    Plan  upgrade HEP as needed and assess progress the past month    Clinical Decision Making  Multiple treatment options, significant modification of task necessary    OT Home Exercise Plan  see pt instruction    Consulted and Agree with Plan of Care  Patient       Patient will benefit from skilled therapeutic intervention in order to improve the following deficits and impairments:  Decreased range of motion, Impaired flexibility, Decreased coordination, Decreased safety awareness, Increased edema, Impaired sensation, Decreased skin integrity, Decreased knowledge of precautions, Decreased knowledge of use of DME, Decreased scar mobility, Impaired UE functional use, Pain, Decreased strength, Impaired perceived functional ability  Visit Diagnosis: Stiffness of right wrist, not elsewhere classified  Pain in right arm  Stiffness of right hand, not elsewhere classified  Other disturbances of skin sensation  Scar condition and fibrosis of skin  Muscle  weakness (generalized)    Problem List There are no active problems to display for this patient.   Rosalyn Gess OTR/L,CLT 06/13/2017, 2:02 PM  [facility] [address] [place], [address] Phone: [phone]   Fax:  [phone]  Name: Francisco Johns MRN: [id number] Date of Birth: 10-Jan-1976

## 2017-07-03 DIAGNOSIS — S68411D Complete traumatic amputation of right hand at wrist level, subsequent encounter: Secondary | ICD-10-CM | POA: Diagnosis not present

## 2017-07-08 ENCOUNTER — Ambulatory Visit: Payer: 59 | Attending: Plastic Surgery | Admitting: Occupational Therapy

## 2017-07-08 DIAGNOSIS — M79601 Pain in right arm: Secondary | ICD-10-CM | POA: Diagnosis not present

## 2017-07-08 DIAGNOSIS — R208 Other disturbances of skin sensation: Secondary | ICD-10-CM | POA: Diagnosis not present

## 2017-07-08 DIAGNOSIS — L905 Scar conditions and fibrosis of skin: Secondary | ICD-10-CM | POA: Diagnosis not present

## 2017-07-08 DIAGNOSIS — M25631 Stiffness of right wrist, not elsewhere classified: Secondary | ICD-10-CM | POA: Insufficient documentation

## 2017-07-08 DIAGNOSIS — M25641 Stiffness of right hand, not elsewhere classified: Secondary | ICD-10-CM | POA: Diagnosis not present

## 2017-07-08 DIAGNOSIS — M6281 Muscle weakness (generalized): Secondary | ICD-10-CM | POA: Insufficient documentation

## 2017-07-08 NOTE — Patient Instructions (Signed)
Cont with same HEP   but can increase to 5 lbs for sup/pro, RD, UD  But do not over do wrist extention - and aggravate lateral epicondyle

## 2017-07-08 NOTE — Therapy (Signed)
[facility] [address] [place], [address] Phone: [phone]   Fax:  [phone]  Occupational Therapy Treatment  Patient Details  Name: Francisco Johns MRN: [id number] Date of Birth: 02-Dec-1975 No Data Recorded  Encounter Date: 07/08/2017  OT End of Session - 07/08/17 1747    Visit Number  35    Number of Visits  83    Date for OT Re-Evaluation  08/15/17    OT Start Time  1052    OT Stop Time  1149    OT Time Calculation (min)  57 min    Activity Tolerance  Patient tolerated treatment well    Behavior During Therapy  [facility] for tasks assessed/performed       Past Medical History:  Diagnosis Date  . Metatarsal fracture    right big toe    Past Surgical History:  Procedure Laterality Date  . ankle surgery    . NO PAST SURGERIES    . ORIF TOE FRACTURE Right 12/09/2015   Procedure: OPEN REDUCTION INTERNAL FIXATION (ORIF) METATARSAL (TOE) FRACTURE, first metatarsal;  Surgeon: Renette Butters, MD;  Location: [place];  Service: Orthopedics;  Laterality: Right;    There were no vitals filed for this visit.  Subjective Assessment - 07/08/17 1743    Subjective   I seen Dr Vernona Rieger last week - still progressing - discuss with him that I cannot bend pinkie and it is in the way , I hit it , I find that I am starting to reach or try and do things with my R hand without thinking - can  do things , it is just slow     Patient Stated Goals  I want as much movement and use I can get for my R dominant hand- to work , play with my kids, fish , play golf ,    Currently in Pain?  No/denies         [facility] OT Assessment - 07/08/17 0001      Strength   Right Hand Grip (lbs)  23    Right Hand Lateral Pinch  8 lbs        assess function in thumb and digits - flexion a, extention , pinching Grip and prehension-see flowsheet And sensation - see flowsheet and clinical impresssion   Review what  pt is doing at home  And then establish new HEP with hand out :  Composite extention mobs on wall for R UE  Upgrade to 5 lbs for Sup/pro. RD and UD 1-12 reps and increase sets  But coban 5th digit into flexion - to not over grip and compensate with grip - aggravating his lat epicondyle  2lbs  Place and hold wrist extention neutral - coban wrap 5th digit - and only 6 reps - stop wen feeling strain at extensor mass    Gripping putty - stabilze wrist neutral= cont green putty  Use teal putty for  3 point and 2 point pinch in putty- doing 2 poin only with 2nd  Key grip - but done have to tape thumb into PA - can keep it align this date stop if notice over flexion thumb IP  Increase reps - quality more importance   Pushing with finger tips on wall - keeping arch of hand /palm - improving   1 min x 3  Place and hold thumb PA and RA - rubber band - 12 reps and increase sets-  but isolated MP and do RA with palm on talble  Do more sessions but lower reps Manipulation of small ball for Fingers <> palm - with thumb taped into opposition Cont to use hand functionally - even if slow - to work on hand function   pt able to pick up small object and pen with lat grip , and 2 point grip Cut with spring loaded scissors  wrist with marker - but not pen  4th and 5th digit in way                  OT Education - 07/08/17 1746    Education provided  Yes    Education Details  progress and cont HEP     Person(s) Educated  Patient    Methods  Explanation;Demonstration    Comprehension  Verbalized understanding;Returned demonstration       OT Short Term Goals - 05/16/17 1202      OT SHORT TERM GOAL #1   Title  Pt to be ind in HEP to use splints correctly , increase ROM and sensatin in R hand and wrist     Status  Achieved      OT SHORT TERM GOAL #2   Title  Fabricate/ modify or change splints as needed to prevent contractures in R hand and wrist while nerve healing occur     Baseline  still wearing webspacer and fabricated this date digit extention splint for R 3rd     Time  8    Period  Weeks    Status  On-going    Target Date  07/11/17      OT SHORT TERM GOAL #3   Title  R wrist AROM and PROM improve with 10-15 degrees to be able carry object on palm, wipe table and reach with wrist extention to grip     Baseline  able to do first 2 but still some colapsing of wrist during gripping or carrying more than 4 lbs     Time  8    Period  Weeks    Status  On-going    Target Date  07/04/17      OT SHORT TERM GOAL #4   Title  Sensation improve in R hand to identify  ojbects in palm 50% of time     Baseline  3.61 this date except DIP of 3rd thru 5th     Time  8    Period  Weeks    Status  On-going    Target Date  07/11/17        OT Long Term Goals - 05/16/17 1205      OT LONG TERM GOAL #1   Title  Upgrade HEP to increase PIP and MC flexion as nerve is healing to grasp 6 inch object to 3 inch object    Baseline  can use lat grip but not 3 point  ( only on 3 inch object) - but can do 2 point grip on small     Time  12    Period  Weeks    Status  On-going    Target Date  08/15/17      OT LONG TERM GOAL #2   Title  Wrist ROM improve and strength for pt to intiate using 1 lbs weight for wrist in all planes     Status  Achieved      OT LONG TERM GOAL #3   Title  Assess shoulder and upgrade HEP to  decrease pain and compensation     Status  Achieved      OT LONG TERM GOAL #4   Title  Pt to use 5 lbs at wrist and elbow without pain or discomfort to use in ADL's and IADL's     Baseline  using 4 lbs with  RD, UD, Sup and pron some collapsing of wrist into flexion  and 2 for wrist extention     Time  12    Period  Weeks    Status  On-going    Target Date  08/15/17            Plan - 07/08/17 1747    Clinical Impression Statement  Pt seen about month ago - cont to show more thumb motion and use of lateral grip and 2 point - with increase force -  pt show increase IP flexion - and ADD - grip strength is the same - pt cont to not show  5th digit flexion at PIP and DIP - pt seen surgeon last week - considering having flexor tendon repair on 5th that should help with grip strenth and prevent injuty to 5th - wrist strength increase - still decrease for wrist extention - but pt had lateral epicondylitis in past when used to many weight in HEP -pt report increase functional use but being slow- encourage pt to cont to use hand even if slow to work on hard function - sensation can feel textures and temperatures- but still diminished in DIP's of digits - will contact me if going to have surgery     Occupational performance deficits (Please refer to evaluation for details):  ADL's;IADL's;Play;Leisure;Work    Rehab Potential  Good    OT Frequency  Monthly    OT Duration  8 weeks    OT Treatment/Interventions  Self-care/ADL training;Fluidtherapy;Splinting;Therapeutic exercise;Passive range of motion;Manual Therapy;Scar mobilization    Plan  Pt will phone if he is having surgery in the next few wks for 5th digit     Clinical Decision Making  Multiple treatment options, significant modification of task necessary    OT Home Exercise Plan  see pt instruction    Consulted and Agree with Plan of Care  Patient       Patient will benefit from skilled therapeutic intervention in order to improve the following deficits and impairments:  Decreased range of motion, Impaired flexibility, Decreased coordination, Decreased safety awareness, Increased edema, Impaired sensation, Decreased skin integrity, Decreased knowledge of precautions, Decreased knowledge of use of DME, Decreased scar mobility, Impaired UE functional use, Pain, Decreased strength, Impaired perceived functional ability  Visit Diagnosis: Stiffness of right wrist, not elsewhere classified  Pain in right arm  Stiffness of right hand, not elsewhere classified  Other disturbances of skin  sensation  Scar condition and fibrosis of skin  Muscle weakness (generalized)    Problem List There are no active problems to display for this patient.   Rosalyn Gess OTR/L,CLT 07/08/2017, 6:04 PM  [facility] [address], [place], [address] Phone: [phone]   Fax:  [phone]  Name: Francisco Johns MRN: [id number] Date of Birth: [birth year]

## 2017-08-06 DIAGNOSIS — S68411S Complete traumatic amputation of right hand at wrist level, sequela: Secondary | ICD-10-CM | POA: Diagnosis not present

## 2017-08-06 DIAGNOSIS — Z428 Encounter for other plastic and reconstructive surgery following medical procedure or healed injury: Secondary | ICD-10-CM | POA: Diagnosis not present

## 2017-08-06 DIAGNOSIS — M62441 Contracture of muscle, right hand: Secondary | ICD-10-CM | POA: Diagnosis not present

## 2017-08-06 DIAGNOSIS — Z9489 Other transplanted organ and tissue status: Secondary | ICD-10-CM | POA: Diagnosis not present

## 2017-08-06 DIAGNOSIS — L905 Scar conditions and fibrosis of skin: Secondary | ICD-10-CM | POA: Diagnosis not present

## 2017-08-14 DIAGNOSIS — S68411D Complete traumatic amputation of right hand at wrist level, subsequent encounter: Secondary | ICD-10-CM | POA: Diagnosis not present

## 2017-08-21 DIAGNOSIS — H52223 Regular astigmatism, bilateral: Secondary | ICD-10-CM | POA: Diagnosis not present

## 2017-08-21 DIAGNOSIS — H524 Presbyopia: Secondary | ICD-10-CM | POA: Diagnosis not present

## 2017-08-21 DIAGNOSIS — H5213 Myopia, bilateral: Secondary | ICD-10-CM | POA: Diagnosis not present

## 2017-09-04 DIAGNOSIS — S58111A Complete traumatic amputation at level between elbow and wrist, right arm, initial encounter: Secondary | ICD-10-CM | POA: Diagnosis not present

## 2017-09-10 ENCOUNTER — Other Ambulatory Visit: Payer: Self-pay

## 2017-09-10 ENCOUNTER — Ambulatory Visit: Payer: 59 | Attending: Plastic Surgery | Admitting: Occupational Therapy

## 2017-09-10 ENCOUNTER — Encounter: Payer: Self-pay | Admitting: Occupational Therapy

## 2017-09-10 DIAGNOSIS — M25641 Stiffness of right hand, not elsewhere classified: Secondary | ICD-10-CM | POA: Diagnosis not present

## 2017-09-10 DIAGNOSIS — M6281 Muscle weakness (generalized): Secondary | ICD-10-CM | POA: Insufficient documentation

## 2017-09-10 DIAGNOSIS — M79601 Pain in right arm: Secondary | ICD-10-CM | POA: Diagnosis not present

## 2017-09-10 DIAGNOSIS — M25631 Stiffness of right wrist, not elsewhere classified: Secondary | ICD-10-CM | POA: Insufficient documentation

## 2017-09-10 DIAGNOSIS — L905 Scar conditions and fibrosis of skin: Secondary | ICD-10-CM | POA: Insufficient documentation

## 2017-09-10 DIAGNOSIS — R208 Other disturbances of skin sensation: Secondary | ICD-10-CM | POA: Diagnosis not present

## 2017-09-10 NOTE — Therapy (Signed)
[facility] [address], [place], [address] Phone: [phone]   Fax:  [phone]  Occupational Therapy Evaluation  Patient Details  Name: Francisco Johns MRN: [id number] Date of Birth: 1982-05-01 Referring Provider: Serafina Royals   Encounter Date: 09/10/2017  OT End of Session - 09/10/17 1134    Visit Number  1    Number of Visits  14    Date for OT Re-Evaluation  10/29/17    OT Start Time  1010    OT Stop Time  1117    OT Time Calculation (min)  67 min    Activity Tolerance  Patient tolerated treatment well    Behavior During Therapy  [facility] for tasks assessed/performed       Past Medical History:  Diagnosis Date  . Metatarsal fracture    right big toe    Past Surgical History:  Procedure Laterality Date  . ankle surgery    . ORIF TOE FRACTURE Right 12/09/2015   Procedure: OPEN REDUCTION INTERNAL FIXATION (ORIF) METATARSAL (TOE) FRACTURE, first metatarsal;  Surgeon: Renette Butters, MD;  Location: [facility];  Service: Orthopedics;  Laterality: Right;  . 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  Subjective Assessment - 09/10/17 1123    Subjective   I had my surgery 08/06/17 to clean some scar tissue, and repair tendon of pinkie to ring finger - was in cast until last week - my hand are really stiff now -when cast come off - she gave me acewrap and told me not to bend my fingers -did not see Dr Serafina Royals since surgery - going back  26th June     Patient Stated Goals  I want to be able to get my movement in my R hand back and be able to bend the pinkie - I am R hand dominant     Currently in Pain?  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, fish , yard work       AROM   Right Forearm Pronation  80 Degrees    Right Forearm Supination  85 Degrees    Right Wrist Extension  60 Degrees    Right Wrist Flexion  45 Degrees    Right Wrist Radial Deviation  12 Degrees    Right Wrist Ulnar Deviation  25 Degrees      Strength   Right Hand Grip (lbs)  NT      Right Hand AROM   R Index  MCP 0-90  60 Degrees    R Index PIP 0-100  90 Degrees    R Long  MCP 0-90  60 Degrees    R Long PIP 0-100  60 Degrees    R Ring  MCP 0-90  60 Degrees    R Ring PIP 0-100  60 Degrees    R Little  MCP 0-90  40 Degrees    R Little PIP 0-100  65 Degrees      Right Hand PROM   R Index  MCP 0-90  90 Degrees    R Index PIP 0-100  100 Degrees    R Long  MCP 0-90  90 Degrees    R Long PIP 0-100  80 Degrees    R Ring  MCP 0-90  90 Degrees    R Ring PIP 0-100  100 Degrees    R Little  MCP 0-90  90 Degrees    R Little PIP 0-100  60 Degrees          Heat  PROM composite flexion of digits to palm  Block in palm and AROM 2-3 sec - 50 % Block MC and AROM 2-3 sec - 50%  10 reps  Wrist AROM for flexion and extention -stop when feeling pull  10 reps  Scar massage done  HEP 5  Day  Wear dorsal block splint in between HEP - when out and about and sleeping , playing with kids              OT Education - 09/10/17 1134    Education provided  Yes    Education Details  findings of eval and HEP     Person(s) Educated  Patient    Methods  Explanation;Tactile cues;Handout    Comprehension  Returned  demonstration;Verbalized understanding       OT Short Term Goals - 09/10/17 1655      OT SHORT TERM GOAL #1   Title  Pt to be ind in HEP in use of splint , PROM for digits flexion and AROM initiate for digits and wrist  flexion and extention without pain     Baseline  Did not do any ROM up to now - out of cast last week     Time  2    Period  Weeks    Status  New    Target Date  09/24/17      OT SHORT TERM GOAL #2   Title  Pt digits flexion improve PROM to touch palm and AROM to 3 cm object out of palm     Baseline  initated this date PROM and AROM for flexion of digits - 5 wks s/p    Time  3    Period  Weeks    Status  New    Target Date  10/01/17      OT SHORT TERM GOAL #3   Title  R wrist AROM improve to same as prior to surgery to be able to initiate strengthening     Baseline  NO PROM per protocol for wrist - out of cast last week - did not do any ROM     Time  3    Period  Weeks    Status  New    Target Date  10/01/17        OT Long Term Goals - 09/10/17 1700      OT LONG TERM GOAL #1   Title  R digits flexion improve  for pt to hold 1 cm object in palm -and 4th and 5th digit PIP more than 70 degrees     Baseline  out of cast last week - did not do any AROM or PROM yet     Time  6    Period  Weeks    Status  New    Target Date  10/22/17      OT LONG TERM GOAL #2   Title  Grip strength increase in R hand to more than 10 lbs to carry and use hand in driving     Baseline  no strengthening yet - out of cast last week - 5 wks s/p     Period  Weeks    Status  New    Target Date  10/29/17      OT LONG TERM GOAL #3   Title  Wrist strength increase for pt to do more than 2 lbs for wrist to prepare to carry and do yard work     Baseline  no ROM yet- out of cast last week - 5 wlks s/p    Time  7    Period  Weeks    Status  New    Target Date  10/29/17            Plan - 09/10/17 1136    Clinical Impression Statement  Pt was seen for extended period last year  by this OT until about 2 months ago for 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 - pt now returning for OT eval after having on 08/06/17 a tenolysis of volar flexors in wrist and 5th FDS repair to 4th FDS - pt is 5 wks s/p and present at eval in acewrap with wrist and digits in neutral - pt  scar adhere at new scar  and  stiffness in digits and wrist compare to last seen 2 months ag0 - but pt was for about 4 wks in cast - fabricated short forearm base dorsal block splint - and ed on composite flexion PROM to all digits , AROM  of diigst MC and PIP at 50% , and wrist AROM - per  SOne 4/5 flexor protocol  will advance pt in rehab     Occupational performance deficits (Please refer to evaluation for details):  ADL's;IADL's;Play;Leisure;Work    Neurosurgeon    Current Impairments/barriers affecting progress:  had reattachment of hand last year , and now FDS repair with tenolysis     OT Frequency  2x / week    OT Duration  -- 7 wks    OT Treatment/Interventions  Self-care/ADL training;Fluidtherapy;Splinting;Therapeutic exercise;Passive range of motion;Manual Therapy;Scar mobilization;Patient/family education    Plan  assess progress with HEP     Clinical Decision Making  Multiple treatment options, significant modification of task necessary    OT Home Exercise Plan  see pt instruction    Consulted and Agree with Plan of Care  Patient       Patient will benefit from skilled therapeutic intervention in order to improve the following deficits and impairments:  Decreased range of motion, Impaired flexibility, Decreased coordination, Decreased safety awareness, Increased edema, Impaired sensation, Decreased skin integrity, Decreased knowledge of precautions, Decreased knowledge of use of DME, Decreased scar mobility, Impaired UE functional use, Pain, Decreased strength  Visit Diagnosis: Stiffness of right hand, not elsewhere classified - Plan: Ot plan of care cert/re-cert  Stiffness of right wrist, not  elsewhere classified - Plan: Ot plan of care cert/re-cert  Scar condition and fibrosis of skin - Plan: Ot plan of care cert/re-cert  Muscle weakness (generalized) - Plan: Ot plan of care cert/re-cert  Other disturbances of skin sensation - Plan: Ot plan of care cert/re-cert    Problem List There are no active problems to display for this patient.   Rosalyn Gess OTR/l,CLT 09/10/2017, 5:06 PM  [facility] [address] [place], [address] Phone: [phone]   Fax:  [phone]  Name: ANDRES BANTZ MRN: [id number] Date of Birth: Oct 17, 1975

## 2017-09-10 NOTE — Patient Instructions (Signed)
Heat  PROM composite flexion of digits to palm  Block in palm and AROM 2-3 sec - 50 % Block MC and AROM 2-3 sec - 50%  10 reps  Wrist AROM for flexion and extention -stop when feeling pull  10 reps  Scar massage done  HEP 5  Day  Wear dorsal block splint in between HEP - when out and about and sleeping , playing with kids

## 2017-09-12 ENCOUNTER — Ambulatory Visit: Payer: 59 | Admitting: Occupational Therapy

## 2017-09-12 DIAGNOSIS — M25631 Stiffness of right wrist, not elsewhere classified: Secondary | ICD-10-CM | POA: Diagnosis not present

## 2017-09-12 DIAGNOSIS — M6281 Muscle weakness (generalized): Secondary | ICD-10-CM

## 2017-09-12 DIAGNOSIS — R208 Other disturbances of skin sensation: Secondary | ICD-10-CM | POA: Diagnosis not present

## 2017-09-12 DIAGNOSIS — M79601 Pain in right arm: Secondary | ICD-10-CM

## 2017-09-12 DIAGNOSIS — L905 Scar conditions and fibrosis of skin: Secondary | ICD-10-CM | POA: Diagnosis not present

## 2017-09-12 DIAGNOSIS — M25641 Stiffness of right hand, not elsewhere classified: Secondary | ICD-10-CM | POA: Diagnosis not present

## 2017-09-12 NOTE — Therapy (Signed)
[facility] [address] [place], [address] Phone: [phone]   Fax:  [phone]  Occupational Therapy Treatment  Patient Details  Name: Francisco Johns MRN: [id number] Date of Birth: April 11, 1976 Referring Provider: Serafina Royals   Encounter Date: 09/12/2017  OT End of Session - 09/12/17 1747    Visit Number  2    Number of Visits  14    Date for OT Re-Evaluation  10/29/17    OT Start Time  1017    OT Stop Time  1113    OT Time Calculation (min)  56 min    Activity Tolerance  Patient tolerated treatment well    Behavior During Therapy  [facility] for tasks assessed/performed       Past Medical History:  Diagnosis Date  . Metatarsal fracture    right big toe    Past Surgical History:  Procedure Laterality Date  . ankle surgery    . ORIF TOE FRACTURE Right 12/09/2015   Procedure: OPEN REDUCTION INTERNAL FIXATION (ORIF) METATARSAL (TOE) FRACTURE, first metatarsal;  Surgeon: Renette Butters, MD;  Location: [facility];  Service: Orthopedics;  Laterality: Right;  . 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  Subjective Assessment - 09/12/17 1034    Subjective   Exercises doing okay  - increase soreness and some swelling I notice yesterday afternoon - I did the heat - but no ice -     Patient Stated Goals  I want to be able to get my movement in my R hand back and be able to bend the pinkie - I am R hand dominant     Currently in Pain?  Yes    Pain Score  2     Pain Location  Hand    Pain Orientation  Right    Pain Descriptors / Indicators  Sore    Pain Type  Surgical pain    Pain Onset  1 to 4 weeks ago         [facility] OT Assessment - 09/12/17 0001      AROM   Right Wrist Extension  60 Degrees    Right Wrist Flexion  72 Degrees      Right Hand AROM   R Index  MCP 0-90  85 Degrees    R Index PIP 0-100   90 Degrees    R Long  MCP 0-90  65 Degrees    R Long PIP 0-100  67 Degrees    R Ring  MCP 0-90  65 Degrees    R Ring PIP 0-100  75 Degrees    R Little  MCP 0-90  40 Degrees    R Little PIP 0-100  85 Degrees       Measure progress - see flowsheet         OT Treatments/Exercises (OP) - 09/12/17 0001      RUE Contrast Bath   Time  11 minutes  (Pended)       scar mobs and massage done - adhere for composite flexion of digits - ulnar side - middle of tenolysis  PROM for PIP and DIP - prior to composite  PROM composite flexion of digits to palm  Block in palm and AROM 2-3 sec - 50 % Block MC and AROM 2-3 sec - 50%  10 reps  Wrist  AROM for flexion and extention -stop when feeling pull  10 reps  And tapping of diigtst and wrist extention of table   add to HEP  kinesiotape done over adhesion - in star - pt to do at home during day  HEP 5 x Day  Wear dorsal block splint in between HEP - when out and about and sleeping , playing with kids  And can do thumb PA and RA place and hold with rubber band - not affecting FDS of 4th and 5th         OT Education - 09/12/17 1746    Education provided  Yes    Education Details  HEP review and add     Person(s) Educated  Patient    Methods  Explanation;Demonstration;Tactile cues;Handout    Comprehension  Verbalized understanding;Returned demonstration       OT Short Term Goals - 09/10/17 1655      OT SHORT TERM GOAL #1   Title  Pt to be ind in HEP in use of splint , PROM for digits flexion and AROM initiate for digits and wrist  flexion and extention without pain     Baseline  Did not do any ROM up to now - out of cast last week     Time  2    Period  Weeks    Status  New    Target Date  09/24/17      OT SHORT TERM GOAL #2   Title  Pt digits flexion improve PROM to touch palm and AROM to 3 cm object out of palm     Baseline  initated this date PROM and AROM for flexion of digits - 5 wks s/p    Time  3    Period  Weeks     Status  New    Target Date  10/01/17      OT SHORT TERM GOAL #3   Title  R wrist AROM improve to same as prior to surgery to be able to initiate strengthening     Baseline  NO PROM per protocol for wrist - out of cast last week - did not do any ROM     Time  3    Period  Weeks    Status  New    Target Date  10/01/17        OT Long Term Goals - 09/10/17 1700      OT LONG TERM GOAL #1   Title  R digits flexion improve for pt to hold 1 cm object in palm -and 4th and 5th digit PIP more than 70 degrees     Baseline  out of cast last week - did not do any AROM or PROM yet     Time  6    Period  Weeks    Status  New    Target Date  10/22/17      OT LONG TERM GOAL #2   Title  Grip strength increase in R hand to more than 10 lbs to carry and use hand in driving     Baseline  no strengthening yet - out of cast last week - 5 wks s/p     Period  Weeks    Status  New    Target Date  10/29/17      OT LONG TERM GOAL #3   Title  Wrist strength increase for pt to do more than 2 lbs for wrist to prepare to carry and  do yard work     Baseline  no ROM yet- out of cast last week - 5 wlks s/p    Time  7    Period  Weeks    Status  New    Target Date  10/29/17            Plan - 09/12/17 1748    Clinical Impression Statement  Pt showed increase R wrist AROM and digits flexion since 2 days ago - pt scar adhere ulnar side and middle where tenolyse was done - pt ed on scar massage and kinesiotape applied today -     Occupational performance deficits (Please refer to evaluation for details):  ADL's;IADL's;Play;Leisure;Work    Neurosurgeon    Current Impairments/barriers affecting progress:  had reattachment of hand last year , and now FDS repair with tenolysis     OT Frequency  2x / week    OT Duration  -- 7 wks    OT Treatment/Interventions  Self-care/ADL training;Fluidtherapy;Splinting;Therapeutic exercise;Passive range of motion;Manual Therapy;Scar mobilization;Patient/family  education    Plan  assess progress and upgrade per protocol    Clinical Decision Making  Multiple treatment options, significant modification of task necessary    OT Home Exercise Plan  see pt instruction    Consulted and Agree with Plan of Care  Patient       Patient will benefit from skilled therapeutic intervention in order to improve the following deficits and impairments:  Decreased range of motion, Impaired flexibility, Decreased coordination, Decreased safety awareness, Increased edema, Impaired sensation, Decreased skin integrity, Decreased knowledge of precautions, Decreased knowledge of use of DME, Decreased scar mobility, Impaired UE functional use, Pain, Decreased strength  Visit Diagnosis: Stiffness of right hand, not elsewhere classified  Stiffness of right wrist, not elsewhere classified  Muscle weakness (generalized)  Other disturbances of skin sensation  Pain in right arm  Scar condition and fibrosis of skin    Problem List There are no active problems to display for this patient.   Rosalyn Gess OTR/L,CLT 09/12/2017, 5:52 PM  [facility] [address], [place], [address] Phone: [phone]   Fax:  [phone]  Name: LUAN MABERRY MRN: [id number] Date of Birth: 03-Aug-1975

## 2017-09-12 NOTE — Patient Instructions (Signed)
Add tapping of digits, wrist extention AROM from table  Prior to composite flexion PROM - do PIP/DIP flexion PROM  Thumb PA and RA - can do rubber band - it will not affect FDS repair Scar massage - to and kinesiotape

## 2017-09-18 ENCOUNTER — Ambulatory Visit: Payer: 59 | Admitting: Occupational Therapy

## 2017-09-18 DIAGNOSIS — L905 Scar conditions and fibrosis of skin: Secondary | ICD-10-CM

## 2017-09-18 DIAGNOSIS — M6281 Muscle weakness (generalized): Secondary | ICD-10-CM | POA: Diagnosis not present

## 2017-09-18 DIAGNOSIS — M25631 Stiffness of right wrist, not elsewhere classified: Secondary | ICD-10-CM

## 2017-09-18 DIAGNOSIS — M79601 Pain in right arm: Secondary | ICD-10-CM | POA: Diagnosis not present

## 2017-09-18 DIAGNOSIS — M25641 Stiffness of right hand, not elsewhere classified: Secondary | ICD-10-CM

## 2017-09-18 DIAGNOSIS — R208 Other disturbances of skin sensation: Secondary | ICD-10-CM | POA: Diagnosis not present

## 2017-09-18 NOTE — Therapy (Signed)
[facility] [address], [place], [address] Phone: [phone]   Fax:  [phone]  Occupational Therapy Treatment  Patient Details  Name: Francisco Johns MRN: [id number] Date of Birth: 1976-01-11 Referring Provider: Serafina Royals   Encounter Date: 09/18/2017  OT End of Session - 09/18/17 0928    Visit Number  3    Number of Visits  14    Date for OT Re-Evaluation  10/29/17    OT Start Time  0905    OT Stop Time  1000    OT Time Calculation (min)  55 min    Activity Tolerance  Patient tolerated treatment well    Behavior During Therapy  [facility] for tasks assessed/performed       Past Medical History:  Diagnosis Date  . Metatarsal fracture    right big toe    Past Surgical History:  Procedure Laterality Date  . ankle surgery    . ORIF TOE FRACTURE Right 12/09/2015   Procedure: OPEN REDUCTION INTERNAL FIXATION (ORIF) METATARSAL (TOE) FRACTURE, first metatarsal;  Surgeon: Renette Butters, MD;  Location: [facility];  Service: Orthopedics;  Laterality: Right;  . 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  Subjective Assessment - 09/18/17 0910    Subjective   Not feeling as tight , moving better - not feeling the pain in the back of the hand like before - what about splint - can I take that off now - still afraid to make tight fist    Patient Stated Goals  I want to be able to get my movement in my R hand back and be able to bend the pinkie - I am R hand dominant     Currently in Pain?  No/denies         [facility] OT Assessment - 09/18/17 0001      AROM   Right Wrist Extension  60 Degrees    Right Wrist Flexion  80 Degrees      Right Hand AROM   R Index  MCP 0-90  85 Degrees    R Index PIP 0-100  95 Degrees    R Long  MCP 0-90  85 Degrees    R Long PIP 0-100  80 Degrees    R Ring  MCP 0-90  70 Degrees     R Ring PIP 0-100  70 Degrees    R Little  MCP 0-90  65 Degrees    R Little PIP 0-100  55 Degrees        measurement AROM for wrist and digits flexion  See flowsheet  Great progress         OT Treatments/Exercises (OP) - 09/18/17 0001      RUE Fluidotherapy   Number Minutes Fluidotherapy  10 Minutes    RUE Fluidotherapy Location  Hand;Wrist    Comments  prior to ROM to increase ROM and decrease scar        scar mobs and massage done - adhere for composite flexion of digits - ulnar side - middle of tenolysis  Use coban for scar massage - opposite direction of tendon glide - and do at  home  PROM for PIP and DIP - prior to composite  PROM composite flexion of digits to palm  Block in palm and AROM 2-3 sec - 50 % Block  MC and AROM 2-3 sec - 50%  10 reps  Full fist to 2cm foam block  Attempted place and hold AROM - but scar adhesion - pull 4thand 5th into extention  Wrist AROM for flexion and extention -stop when feeling pull  10 reps  And tapping of diigtst and wrist extention of table   add to HEP  kinesiotape done over adhesion - in star - pt to do at home during day  HEP 5 xDay  Wear dorsal block splint in between HEP - when out and about and sleeping , playing with kids And can do thumb PA and RA place and hold with rubber band - not affecting FDS of 4th and 5th  Prayer stretch done 10 reps   and nerve glide for composite extention  stretch on wall         OT Education - 09/18/17 0927    Education provided  Yes    Education Details  protocol change - add PROM and stretches for composite - but not sustained grip     Person(s) Educated  Patient    Methods  Explanation;Demonstration    Comprehension  Verbalized understanding;Returned demonstration       OT Short Term Goals - 09/10/17 1655      OT SHORT TERM GOAL #1   Title  Pt to be ind in HEP in use of splint , PROM for digits flexion and AROM initiate for digits and wrist  flexion and extention without  pain     Baseline  Did not do any ROM up to now - out of cast last week     Time  2    Period  Weeks    Status  New    Target Date  09/24/17      OT SHORT TERM GOAL #2   Title  Pt digits flexion improve PROM to touch palm and AROM to 3 cm object out of palm     Baseline  initated this date PROM and AROM for flexion of digits - 5 wks s/p    Time  3    Period  Weeks    Status  New    Target Date  10/01/17      OT SHORT TERM GOAL #3   Title  R wrist AROM improve to same as prior to surgery to be able to initiate strengthening     Baseline  NO PROM per protocol for wrist - out of cast last week - did not do any ROM     Time  3    Period  Weeks    Status  New    Target Date  10/01/17        OT Long Term Goals - 09/10/17 1700      OT LONG TERM GOAL #1   Title  R digits flexion improve for pt to hold 1 cm object in palm -and 4th and 5th digit PIP more than 70 degrees     Baseline  out of cast last week - did not do any AROM or PROM yet     Time  6    Period  Weeks    Status  New    Target Date  10/22/17      OT LONG TERM GOAL #2   Title  Grip strength increase in R hand to more than 10 lbs to carry and use hand in driving     Baseline  no strengthening yet - out  of cast last week - 5 wks s/p     Period  Weeks    Status  New    Target Date  10/29/17      OT LONG TERM GOAL #3   Title  Wrist strength increase for pt to do more than 2 lbs for wrist to prepare to carry and do yard work     Baseline  no ROM yet- out of cast last week - 5 wlks s/p    Time  7    Period  Weeks    Status  New    Target Date  10/29/17            Plan - 09/18/17 0929    Clinical Impression Statement  Pt showed great progress in wrist AROM and digits flexion - scar tissue adhesion limiting composite flexion 4th thru 5th -pt discharge splint this date 6 wks s/p per protocol     Occupational performance deficits (Please refer to evaluation for details):  ADL's;IADL's;Play;Leisure;Work    Transport planner    Current Impairments/barriers affecting progress:  had reattachment of hand last year , and now FDS repair with tenolysis     OT Frequency  2x / week    OT Duration  -- 7 wks     OT Treatment/Interventions  Self-care/ADL training;Fluidtherapy;Splinting;Therapeutic exercise;Passive range of motion;Manual Therapy;Scar mobilization;Patient/family education    Plan  assess progress and upgrade per protocol    Clinical Decision Making  Multiple treatment options, significant modification of task necessary    OT Home Exercise Plan  see pt instruction    Consulted and Agree with Plan of Care  Patient       Patient will benefit from skilled therapeutic intervention in order to improve the following deficits and impairments:  Decreased range of motion, Impaired flexibility, Decreased coordination, Decreased safety awareness, Increased edema, Impaired sensation, Decreased skin integrity, Decreased knowledge of precautions, Decreased knowledge of use of DME, Decreased scar mobility, Impaired UE functional use, Pain, Decreased strength  Visit Diagnosis: Stiffness of right hand, not elsewhere classified  Stiffness of right wrist, not elsewhere classified  Muscle weakness (generalized)  Other disturbances of skin sensation  Pain in right arm  Scar condition and fibrosis of skin    Problem List There are no active problems to display for this patient.   Jayda White  OTR/l, CLT 09/18/2017, 10:47 AM  [facility] [address] [place], [address] Phone: [phone]   Fax:  [phone]  Name: Francisco Johns MRN: [id number] Date of Birth: 07/02/1975

## 2017-09-18 NOTE — Patient Instructions (Addendum)
PROM and stretches for wrist  And digits extention - composite extention add  10 x  Composite flexion to focus on - and scar tissue  And no sustained grip -  Taping

## 2017-09-20 ENCOUNTER — Ambulatory Visit: Payer: 59 | Admitting: Occupational Therapy

## 2017-09-20 DIAGNOSIS — M79601 Pain in right arm: Secondary | ICD-10-CM

## 2017-09-20 DIAGNOSIS — M25631 Stiffness of right wrist, not elsewhere classified: Secondary | ICD-10-CM | POA: Diagnosis not present

## 2017-09-20 DIAGNOSIS — R208 Other disturbances of skin sensation: Secondary | ICD-10-CM | POA: Diagnosis not present

## 2017-09-20 DIAGNOSIS — M25641 Stiffness of right hand, not elsewhere classified: Secondary | ICD-10-CM | POA: Diagnosis not present

## 2017-09-20 DIAGNOSIS — L905 Scar conditions and fibrosis of skin: Secondary | ICD-10-CM | POA: Diagnosis not present

## 2017-09-20 DIAGNOSIS — M6281 Muscle weakness (generalized): Secondary | ICD-10-CM | POA: Diagnosis not present

## 2017-09-20 NOTE — Therapy (Signed)
[facility] [address], [place], [address] Phone: [phone]   Fax:  [phone]  Occupational Therapy Treatment  Patient Details  Name: Francisco Johns MRN: [id number] Date of Birth: 23-Apr-1976 Referring Provider: Serafina Royals   Encounter Date: 09/20/2017  OT End of Session - 09/20/17 1731    Visit Number  4    Number of Visits  14    Date for OT Re-Evaluation  10/29/17    OT Start Time  0901    OT Stop Time  0954    OT Time Calculation (min)  53 min    Activity Tolerance  Patient tolerated treatment well    Behavior During Therapy  [facility] for tasks assessed/performed       Past Medical History:  Diagnosis Date  . Metatarsal fracture    right big toe    Past Surgical History:  Procedure Laterality Date  . ankle surgery    . ORIF TOE FRACTURE Right 12/09/2015   Procedure: OPEN REDUCTION INTERNAL FIXATION (ORIF) METATARSAL (TOE) FRACTURE, first metatarsal;  Surgeon: Renette Butters, MD;  Location: [facility];  Service: Orthopedics;  Laterality: Right;  . 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  Subjective Assessment - 09/20/17 1728    Subjective   Getting there- little stiff this am  - did not work my fingers yet this am - the vibration of the lawn mower and blower do bother it - but I wear glove - used my L hand mostly     Patient Stated Goals  I want to be able to get my movement in my R hand back and be able to bend the pinkie - I am R hand dominant     Currently in Pain?  No/denies         [facility] OT Assessment - 09/20/17 0001      AROM   Right Wrist Extension  60 Degrees    Right Wrist Flexion  88 Degrees      Right Hand AROM   R Index  MCP 0-90  90 Degrees    R Index PIP 0-100  100 Degrees    R Long  MCP 0-90  90 Degrees    R Long PIP 0-100  80 Degrees    R Ring  MCP 0-90  80 Degrees     R Ring PIP 0-100  80 Degrees    R Little  MCP 0-90  65 Degrees    R Little PIP 0-100  65 Degrees       AROM assess for wrist and digits flexion - see flowsheet         OT Treatments/Exercises (OP) - 09/20/17 0001      Moist Heat Therapy   Number Minutes Moist Heat  8 Minutes    Moist Heat Location  -- coban into flexion the 3rd thru 5th         scar mobs and massage done - adhere for composite flexion of digits - ulnar side - middle of tenolysis  Used vibration and manual therapy by OT - opposites  PROM for PIP and DIP - prior to composite PROM composite flexion of digits to palm  Block in palm and AROM 2-3 sec -100% Block MC and AROM 2-3 sec - 100% 10 reps  Full fist to 2cm foam block  Attempted place and hold AROM - but scar adhesion - pull 3rd  Thru 5th into extention  Wrist AROM for flexion and extention -stop when feeling pull  10 reps And tapping of diigtst and wrist extention of table  add to HEP  kinesiotape done over adhesion - in star - pt to do at home during day HEP 5xDay  Discharge splint last time  And can do thumb PA and RA place and hold with rubber band - not affecting FDS of 4th and 5th Prayer stretch done 10 reps   and nerve glide for composite extention  stretch on wall  1 lbs for wrist in all planes - 2 x 10 reps  But not sustained or tight grip       OT Education - 09/20/17 1731    Education provided  Yes    Education Details  HEP upgrade to 1 lbs for wrist     Person(s) Educated  Patient    Methods  Explanation    Comprehension  Verbalized understanding;Returned demonstration       OT Short Term Goals - 09/10/17 1655      OT SHORT TERM GOAL #1   Title  Pt to be ind in HEP in use of splint , PROM for digits flexion and AROM initiate for digits and wrist  flexion and extention without pain     Baseline  Did not do any ROM up to now - out of cast last week     Time  2    Period  Weeks    Status  New    Target Date   09/24/17      OT SHORT TERM GOAL #2   Title  Pt digits flexion improve PROM to touch palm and AROM to 3 cm object out of palm     Baseline  initated this date PROM and AROM for flexion of digits - 5 wks s/p    Time  3    Period  Weeks    Status  New    Target Date  10/01/17      OT SHORT TERM GOAL #3   Title  R wrist AROM improve to same as prior to surgery to be able to initiate strengthening     Baseline  NO PROM per protocol for wrist - out of cast last week - did not do any ROM     Time  3    Period  Weeks    Status  New    Target Date  10/01/17        OT Long Term Goals - 09/10/17 1700      OT LONG TERM GOAL #1   Title  R digits flexion improve for pt to hold 1 cm object in palm -and 4th and 5th digit PIP more than 70 degrees     Baseline  out of cast last week - did not do any AROM or PROM yet     Time  6    Period  Weeks    Status  New    Target Date  10/22/17      OT LONG TERM GOAL #2   Title  Grip strength increase in R hand to more than 10 lbs to carry and use hand in driving     Baseline  no strengthening yet - out of cast last week - 5 wks s/p     Period  Weeks    Status  New  Target Date  10/29/17      OT LONG TERM GOAL #3   Title  Wrist strength increase for pt to do more than 2 lbs for wrist to prepare to carry and do yard work     Baseline  no ROM yet- out of cast last week - 5 wlks s/p    Time  7    Period  Weeks    Status  New    Target Date  10/29/17            Plan - 09/20/17 1732    Clinical Impression Statement  Pt cont to show increase AROM in diigts flexion-  but still scar adhesions decreasing AROM for composite flexion for 3rd thru 5th - pt to avoid tight and sustained grip but initiated this date 1 lbs for wrist in all planes     Occupational performance deficits (Please refer to evaluation for details):  ADL's;IADL's;Play;Leisure;Work    Neurosurgeon    Current Impairments/barriers affecting progress:  had reattachment of  hand last year , and now FDS repair with tenolysis     OT Frequency  2x / week    OT Duration  6 weeks    OT Treatment/Interventions  Self-care/ADL training;Fluidtherapy;Splinting;Therapeutic exercise;Passive range of motion;Manual Therapy;Scar mobilization;Patient/family education    Plan  assess progress and upgrade per protocol    Clinical Decision Making  Multiple treatment options, significant modification of task necessary    OT Home Exercise Plan  see pt instruction    Consulted and Agree with Plan of Care  Patient       Patient will benefit from skilled therapeutic intervention in order to improve the following deficits and impairments:  Decreased range of motion, Impaired flexibility, Decreased coordination, Decreased safety awareness, Increased edema, Impaired sensation, Decreased skin integrity, Decreased knowledge of precautions, Decreased knowledge of use of DME, Decreased scar mobility, Impaired UE functional use, Pain, Decreased strength  Visit Diagnosis: Stiffness of right hand, not elsewhere classified  Stiffness of right wrist, not elsewhere classified  Muscle weakness (generalized)  Other disturbances of skin sensation  Pain in right arm  Scar condition and fibrosis of skin    Problem List There are no active problems to display for this patient.   Rosalyn Gess OTR/L,CLT 09/20/2017, 5:34 PM  [facility] [address] [place], [address] Phone: [phone]   Fax:  [phone]  Name: BRAXDEN LOVERING MRN: [id number] Date of Birth: 02-Feb-1976

## 2017-09-20 NOTE — Patient Instructions (Signed)
Cont same HEP but add 2 x 10 reps   1 lbs for wrist in all planes   but not sustained tight grip  Buddy strap for 4th to 5th - on and off during ay

## 2017-09-24 ENCOUNTER — Ambulatory Visit: Payer: 59 | Attending: Plastic Surgery | Admitting: Occupational Therapy

## 2017-09-24 DIAGNOSIS — M25641 Stiffness of right hand, not elsewhere classified: Secondary | ICD-10-CM | POA: Diagnosis not present

## 2017-09-24 DIAGNOSIS — R208 Other disturbances of skin sensation: Secondary | ICD-10-CM | POA: Insufficient documentation

## 2017-09-24 DIAGNOSIS — M25631 Stiffness of right wrist, not elsewhere classified: Secondary | ICD-10-CM | POA: Diagnosis not present

## 2017-09-24 DIAGNOSIS — M79601 Pain in right arm: Secondary | ICD-10-CM | POA: Diagnosis not present

## 2017-09-24 DIAGNOSIS — M6281 Muscle weakness (generalized): Secondary | ICD-10-CM | POA: Diagnosis not present

## 2017-09-24 DIAGNOSIS — L905 Scar conditions and fibrosis of skin: Secondary | ICD-10-CM | POA: Insufficient documentation

## 2017-09-24 NOTE — Therapy (Signed)
[facility] [address], [place], [address] Phone: [phone]   Fax:  [phone]  Occupational Therapy Treatment  Patient Details  Name: Francisco Johns MRN: [id number] Date of Birth: Oct 29, 1975 Referring Provider: Serafina Royals   Encounter Date: 09/24/2017  OT End of Session - 09/24/17 1237    Visit Number  5    Number of Visits  14    Date for OT Re-Evaluation  09/24/17    OT Start Time  0936    OT Stop Time  1018    OT Time Calculation (min)  42 min    Activity Tolerance  Patient tolerated treatment well    Behavior During Therapy  [facility] for tasks assessed/performed       Past Medical History:  Diagnosis Date  . Metatarsal fracture    right big toe    Past Surgical History:  Procedure Laterality Date  . ankle surgery    . ORIF TOE FRACTURE Right 12/09/2015   Procedure: OPEN REDUCTION INTERNAL FIXATION (ORIF) METATARSAL (TOE) FRACTURE, first metatarsal;  Surgeon: Renette Butters, MD;  Location: [facility];  Service: Orthopedics;  Laterality: Right;  . 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  Subjective Assessment - 09/24/17 0954    Subjective   Doing better - I can tell it is getting less tight - doing taping during day and clear one at night - trying to use my hand more - driving , can hold broom now - even reading a book - laying my hand down - better     Patient Stated Goals  I want to be able to get my movement in my R hand back and be able to bend the pinkie - I am R hand dominant     Currently in Pain?  No/denies         [facility] OT Assessment - 09/24/17 0001      Right Hand AROM   R Index  MCP 0-90  88 Degrees    R Index PIP 0-100  100 Degrees    R Long  MCP 0-90  90 Degrees    R Long PIP 0-100  100 Degrees    R Ring  MCP 0-90  65 Degrees    R Ring PIP 0-100  96 Degrees    R Little   MCP 0-90  55 Degrees    R Little PIP 0-100  92 Degrees      Measure AROM for digits flexion  Cont to progress          OT Treatments/Exercises (OP) - 09/24/17 0001      Moist Heat Therapy   Number Minutes Moist Heat  8 Minutes    Moist Heat Location  -- flexion wrap onto digits flexion        scar mobs and massage done - adhere for composite flexion of digits - ulnar side - middle of tenolysis Used vibration and manual therapy by OT - opposites  directions and done xtractor this date with AROM   PROM composite flexion of digits to palm  Block in palm and AROM 2-3 sec -100% Block MC and AROM 2-3 sec - 100% 10 reps  Attempted place and hold AROM - but scar adhesion - pull 3rd  Thru 5th into extention But improving   kinesiotape done over adhesion -  in star - pt to do at home during day HEP 5xDay  Discharge splint last time  And can do thumb PA and RA place and hold with rubber band - not affecting FDS of 4th and 5th Prayer stretch done 10 reps  and nerve glide for composite extention stretch on wall  2 lbs for wrist in all planes - 2 x 10 reps Except wrist extention place and hold still 1 lbs weight   But not sustained or tight grip        OT Education - 09/24/17 1236    Education provided  Yes    Education Details  upgrade to light putty and 2 lbs weight for some of wrist exercises -avoid tight sustained grip     Person(s) Educated  Patient    Methods  Explanation;Demonstration    Comprehension  Verbalized understanding;Returned demonstration    Person(s) Educated  Patient       OT Short Term Goals - 09/10/17 1655      OT SHORT TERM GOAL #1   Title  Pt to be ind in HEP in use of splint , PROM for digits flexion and AROM initiate for digits and wrist  flexion and extention without pain     Baseline  Did not do any ROM up to now - out of cast last week     Time  2    Period  Weeks    Status  New    Target Date  09/24/17      OT SHORT TERM GOAL  #2   Title  Pt digits flexion improve PROM to touch palm and AROM to 3 cm object out of palm     Baseline  initated this date PROM and AROM for flexion of digits - 5 wks s/p    Time  3    Period  Weeks    Status  New    Target Date  10/01/17      OT SHORT TERM GOAL #3   Title  R wrist AROM improve to same as prior to surgery to be able to initiate strengthening     Baseline  NO PROM per protocol for wrist - out of cast last week - did not do any ROM     Time  3    Period  Weeks    Status  New    Target Date  10/01/17        OT Long Term Goals - 09/10/17 1700      OT LONG TERM GOAL #1   Title  R digits flexion improve for pt to hold 1 cm object in palm -and 4th and 5th digit PIP more than 70 degrees     Baseline  out of cast last week - did not do any AROM or PROM yet     Time  6    Period  Weeks    Status  New    Target Date  10/22/17      OT LONG TERM GOAL #2   Title  Grip strength increase in R hand to more than 10 lbs to carry and use hand in driving     Baseline  no strengthening yet - out of cast last week - 5 wks s/p     Period  Weeks    Status  New    Target Date  10/29/17      OT LONG TERM GOAL #3   Title  Wrist strength increase for  pt to do more than 2 lbs for wrist to prepare to carry and do yard work     Baseline  no ROM yet- out of cast last week - 5 wlks s/p    Time  7    Period  Weeks    Status  New    Target Date  10/29/17            Plan - 09/24/17 1238    Clinical Impression Statement  Pt cont to show increase AROM in digits flexion -and strength for wrist - able to upgrade some HEP to 2 lbs nad add this date light putty for gripping - pt still having scar adhesions - and to avoid tight and sustained grip     Occupational performance deficits (Please refer to evaluation for details):  ADL's;IADL's;Play;Leisure;Work    Neurosurgeon    Current Impairments/barriers affecting progress:  had reattachment of hand last year , and now FDS  repair with tenolysis     OT Frequency  2x / week    OT Duration  4 weeks    OT Treatment/Interventions  Self-care/ADL training;Fluidtherapy;Splinting;Therapeutic exercise;Passive range of motion;Manual Therapy;Scar mobilization;Patient/family education    Plan  assess progress and upgrade per protocol    Clinical Decision Making  Multiple treatment options, significant modification of task necessary    OT Home Exercise Plan  see pt instruction    Consulted and Agree with Plan of Care  Patient       Patient will benefit from skilled therapeutic intervention in order to improve the following deficits and impairments:  Decreased range of motion, Impaired flexibility, Decreased coordination, Decreased safety awareness, Increased edema, Impaired sensation, Decreased skin integrity, Decreased knowledge of precautions, Decreased knowledge of use of DME, Decreased scar mobility, Impaired UE functional use, Pain, Decreased strength  Visit Diagnosis: Stiffness of right hand, not elsewhere classified  Stiffness of right wrist, not elsewhere classified  Muscle weakness (generalized)  Other disturbances of skin sensation  Pain in right arm  Scar condition and fibrosis of skin    Problem List There are no active problems to display for this patient.   Rosalyn Gess OTR/L,CLT 09/24/2017, 12:39 PM  [facility] [address], [place], [address] Phone: [phone]   Fax:  [phone]  Name: Francisco Johns MRN: [id number] Date of Birth: 20-Sep-1975

## 2017-09-24 NOTE — Patient Instructions (Signed)
Same but can increase to 2 lbs for Sup , pron 2 x 10 reps  1 lbs still for wrist ext place and hold  10 reps x 2  2 lbs for RD, UD 10 reps  x 2   No sustained grip  Putty - light blue - 10 reps  2 sets  And pinching 2 point and attempt 3 point  10 reps   x 2  X 2 day

## 2017-09-27 ENCOUNTER — Ambulatory Visit: Payer: 59 | Admitting: Occupational Therapy

## 2017-09-27 DIAGNOSIS — L905 Scar conditions and fibrosis of skin: Secondary | ICD-10-CM

## 2017-09-27 DIAGNOSIS — R208 Other disturbances of skin sensation: Secondary | ICD-10-CM

## 2017-09-27 DIAGNOSIS — M6281 Muscle weakness (generalized): Secondary | ICD-10-CM

## 2017-09-27 DIAGNOSIS — M79601 Pain in right arm: Secondary | ICD-10-CM | POA: Diagnosis not present

## 2017-09-27 DIAGNOSIS — M25631 Stiffness of right wrist, not elsewhere classified: Secondary | ICD-10-CM | POA: Diagnosis not present

## 2017-09-27 DIAGNOSIS — M25641 Stiffness of right hand, not elsewhere classified: Secondary | ICD-10-CM

## 2017-09-27 NOTE — Patient Instructions (Signed)
Same HEP   but some digts extention against wall   putty to cont with - light blue   thumb PA and RA rubber band  2 lbs for wrist sup /pro, RD, UD  3 sets  And 1 lbs for wrist extention - gripping and doing and wrapping with coban   10reps   up to 3 sets if no pain   place and hold  Scar mobs  And kinesiotape on at daytime  X patterns

## 2017-09-27 NOTE — Therapy (Signed)
[facility] [address] [place], [address] Phone: [phone]   Fax:  [phone]  Occupational Therapy Treatment  Patient Details  Name: Francisco Johns MRN: [id number] Date of Birth: 1975-08-26 Referring Provider: Serafina Royals   Encounter Date: 09/27/2017  OT End of Session - 09/27/17 1245    Visit Number  6    Number of Visits  18    Date for OT Re-Evaluation  11/05/17    OT Start Time  0818    OT Stop Time  0916    OT Time Calculation (min)  58 min    Activity Tolerance  Patient tolerated treatment well    Behavior During Therapy  [facility] for tasks assessed/performed       Past Medical History:  Diagnosis Date  . Metatarsal fracture    right big toe    Past Surgical History:  Procedure Laterality Date  . ankle surgery    . ORIF TOE FRACTURE Right 12/09/2015   Procedure: OPEN REDUCTION INTERNAL FIXATION (ORIF) METATARSAL (TOE) FRACTURE, first metatarsal;  Surgeon: Renette Butters, MD;  Location: [facility];  Service: Orthopedics;  Laterality: Right;  . 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  Subjective Assessment - 09/27/17 0949    Subjective   I helped my dad some and could hold the rake better- and holding onto objects better now that my pinkie can grip better     Patient Stated Goals  I want to be able to get my movement in my R hand back and be able to bend the pinkie - I am R hand dominant     Currently in Pain?  No/denies         [facility] OT Assessment - 09/27/17 0001      Right Hand AROM   R Index  MCP 0-90  90 Degrees    R Index PIP 0-100  100 Degrees    R Long  MCP 0-90  90 Degrees    R Long PIP 0-100  100 Degrees    R Ring  MCP 0-90  70 Degrees    R Ring PIP 0-100  96 Degrees    R Little  MCP 0-90  65 Degrees    R Little PIP 0-100  80 Degrees       pt to work on composite fist  - when getting measured - to reach to bottom of palm to isolated separate IP and MC flexoin -and then composite          OT Treatments/Exercises (OP) - 09/27/17 0001      Moist Heat Therapy   Number Minutes Moist Heat  8 Minutes    Moist Heat Location  -- flexion wrap coban 5th  thru 3rd for stretch        scar mobs and massage done - adhere for composite flexion of digits - ulnar side - middle of tenolysis Used vibration and manual therapy by OT - opposites directions and done xtractor this date with AROM and some graston brushing with tool nr 2   PROM composite flexion of digits to palm  Block in palm and AROM MC  2-3 sec -100% Block MC and AROM PIP  2-3 sec -100% 10 reps  Attempted place and hold AROM - but scar adhesion - pull3rd Thru 5th into extention But improving  kinesiotape done over adhesion - in 3  stars - pt to do at home during day   And can do thumb PA and RA place and hold with rubber band -  And add this date pushing into wall for digits extention with arching of palm - and some flexion in digits including 5th  And work on 3 point  Prayer stretch done 10 reps  and nerve glide for composite extention stretch on wall- review again to cont with   2 lbs for wrist in all planes - 3  x 10 reps Except wrist extention place and hold still 1 lbs weight but can do actively and with coban wrap    But not sustained or tight grip Cont light blue putty - 3 x 10 reps  And can increase next week to teal  But wait for 3 lbs 2 wks  Can do around 10 wks s/p BTE but start light resistance        OT Education - 09/27/17 1245    Education provided  Yes    Education Details  HEP and how to upgrade     Person(s) Educated  Patient    Methods  Demonstration;Explanation    Comprehension  Returned demonstration    Methods  Explanation;Demonstration       OT Short Term Goals - 09/10/17 1655      OT SHORT TERM GOAL #1   Title  Pt to be ind in HEP in  use of splint , PROM for digits flexion and AROM initiate for digits and wrist  flexion and extention without pain     Baseline  Did not do any ROM up to now - out of cast last week     Time  2    Period  Weeks    Status  New    Target Date  09/24/17      OT SHORT TERM GOAL #2   Title  Pt digits flexion improve PROM to touch palm and AROM to 3 cm object out of palm     Baseline  initated this date PROM and AROM for flexion of digits - 5 wks s/p    Time  3    Period  Weeks    Status  New    Target Date  10/01/17      OT SHORT TERM GOAL #3   Title  R wrist AROM improve to same as prior to surgery to be able to initiate strengthening     Baseline  NO PROM per protocol for wrist - out of cast last week - did not do any ROM     Time  3    Period  Weeks    Status  New    Target Date  10/01/17        OT Long Term Goals - 09/10/17 1700      OT LONG TERM GOAL #1   Title  R digits flexion improve for pt to hold 1 cm object in palm -and 4th and 5th digit PIP more than 70 degrees     Baseline  out of cast last week - did not do any AROM or PROM yet     Time  6    Period  Weeks    Status  New    Target Date  10/22/17      OT LONG TERM GOAL #2   Title  Grip strength increase in R hand to more than 10 lbs to  carry and use hand in driving     Baseline  no strengthening yet - out of cast last week - 5 wks s/p     Period  Weeks    Status  New    Target Date  10/29/17      OT LONG TERM GOAL #3   Title  Wrist strength increase for pt to do more than 2 lbs for wrist to prepare to carry and do yard work     Baseline  no ROM yet- out of cast last week - 5 wlks s/p    Time  7    Period  Weeks    Status  New    Target Date  10/29/17            Plan - 09/27/17 1246    Clinical Impression Statement  Pt is 7 1/2 wks s/p R  Tenolysis and 5th FDS repair to 4th  08/06/17- pt show great progress in AROM of R digits and wrist - and report increase use of hand - pt cont to be limited by  scar adhesions at volar wrist and decrease strength - pt can benefit from cont scar mobs , ROM , strengthening advancement following protocol     Occupational performance deficits (Please refer to evaluation for details):  ADL's;IADL's;Play;Leisure;Work    Neurosurgeon    Current Impairments/barriers affecting progress:  had reattachment of hand last year , and now FDS repair with tenolysis     OT Frequency  2x / week    OT Duration  6 weeks    OT Treatment/Interventions  Self-care/ADL training;Fluidtherapy;Splinting;Therapeutic exercise;Passive range of motion;Manual Therapy;Scar mobilization;Patient/family education    Plan  assess progress and upgrade per protocol    Clinical Decision Making  Multiple treatment options, significant modification of task necessary    OT Home Exercise Plan  see pt instruction    Consulted and Agree with Plan of Care  Patient       Patient will benefit from skilled therapeutic intervention in order to improve the following deficits and impairments:  Decreased range of motion, Impaired flexibility, Decreased coordination, Decreased safety awareness, Increased edema, Impaired sensation, Decreased skin integrity, Decreased knowledge of precautions, Decreased knowledge of use of DME, Decreased scar mobility, Impaired UE functional use, Pain, Decreased strength  Visit Diagnosis: Stiffness of right hand, not elsewhere classified  Stiffness of right wrist, not elsewhere classified  Muscle weakness (generalized)  Other disturbances of skin sensation  Pain in right arm  Scar condition and fibrosis of skin    Problem List There are no active problems to display for this patient.   Rosalyn Gess OTR/L,CLT 09/27/2017, 12:49 PM  [facility] [address] [place], [address] Phone: [phone]   Fax:  [phone]  Name: CESAREO VICKREY MRN: [id number] Date of Birth:  1976/02/23

## 2017-10-01 ENCOUNTER — Encounter: Payer: Self-pay | Admitting: Occupational Therapy

## 2017-10-01 ENCOUNTER — Ambulatory Visit: Payer: 59 | Admitting: Occupational Therapy

## 2017-10-01 DIAGNOSIS — M6281 Muscle weakness (generalized): Secondary | ICD-10-CM

## 2017-10-01 DIAGNOSIS — M79601 Pain in right arm: Secondary | ICD-10-CM | POA: Diagnosis not present

## 2017-10-01 DIAGNOSIS — M25631 Stiffness of right wrist, not elsewhere classified: Secondary | ICD-10-CM | POA: Diagnosis not present

## 2017-10-01 DIAGNOSIS — M25641 Stiffness of right hand, not elsewhere classified: Secondary | ICD-10-CM | POA: Diagnosis not present

## 2017-10-01 DIAGNOSIS — R208 Other disturbances of skin sensation: Secondary | ICD-10-CM | POA: Diagnosis not present

## 2017-10-01 DIAGNOSIS — L905 Scar conditions and fibrosis of skin: Secondary | ICD-10-CM | POA: Diagnosis not present

## 2017-10-03 NOTE — Therapy (Signed)
[facility] [address], [place], [address] Phone: [phone]   Fax:  [phone]  Occupational Therapy Treatment  Patient Details  Name: Francisco Johns MRN: [id number] Date of Birth: 07/14/1975 Referring Provider: Serafina Royals   Encounter Date: 10/01/2017  OT End of Session - 10/03/17 2147    Visit Number  7    Number of Visits  18    Date for OT Re-Evaluation  11/05/17    OT Start Time  0845    OT Stop Time  0945    OT Time Calculation (min)  60 min    Activity Tolerance  Patient tolerated treatment well    Behavior During Therapy  [facility] for tasks assessed/performed       Past Medical History:  Diagnosis Date  . Metatarsal fracture    right big toe    Past Surgical History:  Procedure Laterality Date  . ankle surgery    . ORIF TOE FRACTURE Right 12/09/2015   Procedure: OPEN REDUCTION INTERNAL FIXATION (ORIF) METATARSAL (TOE) FRACTURE, first metatarsal;  Surgeon: Renette Butters, MD;  Location: [place];  Service: Orthopedics;  Laterality: Right;  . 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  Subjective Assessment - 10/03/17 2152    Subjective   Patient reports he is usually around with his Dad at work but hasn't been able to do much of anything.   He can drive and take a load of mulch from point A to B.    Patient Stated Goals  I want to be able to get my movement in my R hand back and be able to bend the pinkie - I am R hand dominant         Therapeutic Intervention:    Contrast to right hand and forearm 3 mins warm, 1 min cold for 3 cycles to decrease edema and to increase ROM.   Patient seen for scar mobilizations to right wrist and forearm, manual from therapist with massage techniques to address adhesions - Use of vibration and manual therapy by OT - opposites directions as well as graston  brushing with tool nr 2 to volar surface of the forearm.    Performed PROM composite flexion of digits to palm , Followed by Block in palm and AROM MC  2-3 sec -100%, Block MC and AROM PIP  2-3 sec - 100%, 10 reps    Continued Attempts at place and hold AROM - but scar adhesion - pull 3rd  Thru 5th into extension, however this continues to improve   Kinesiotape applied over adhesion - in 3  stars pattern, pt to do at home during day   Thumb PA and RA place and hold with rubber band -   Performed Pushing into wall for digits extension with arching of palm - and some flexion in digits including 5th, Prayer stretch performed 10 reps, verbal cues for form, nerve glide for composite extension  stretch on wall with therapist demo and verbal instruction.    2 lbs for wrist in all planes - 3 x 10 reps, occasional verbal cues.                  OT Education - 10/03/17 2147    Education provided  Yes    Education Details  HEP    Person(s) Educated  Patient  Methods  Demonstration;Explanation    Comprehension  Returned demonstration       OT Short Term Goals - 10/01/17 0910      OT SHORT TERM GOAL #1   Title  Pt to be ind in HEP in use of splint , PROM for digits flexion and AROM initiate for digits and wrist  flexion and extention without pain     Baseline  Did not do any ROM up to now - out of cast last week     Time  2    Period  Weeks    Status  New      OT SHORT TERM GOAL #2   Title  Pt digits flexion improve PROM to touch palm and AROM to 3 cm object out of palm     Baseline  initated this date PROM and AROM for flexion of digits - 5 wks s/p    Time  3    Period  Weeks    Status  New      OT SHORT TERM GOAL #3   Title  R wrist AROM improve to same as prior to surgery to be able to initiate strengthening     Baseline  NO PROM per protocol for wrist - out of cast last week - did not do any ROM     Time  3    Period  Weeks    Status  New        OT Long Term  Goals - 10/01/17 0909      OT LONG TERM GOAL #1   Title  R digits flexion improve for pt to hold 1 cm object in palm -and 4th and 5th digit PIP more than 70 degrees     Baseline  out of cast last week - did not do any AROM or PROM yet     Time  6    Period  Weeks    Status  New      OT LONG TERM GOAL #2   Title  Grip strength increase in R hand to more than 10 lbs to carry and use hand in driving     Baseline  no strengthening yet - out of cast last week - 5 wks s/p     Period  Weeks    Status  New      OT LONG TERM GOAL #3   Title  Wrist strength increase for pt to do more than 2 lbs for wrist to prepare to carry and do yard work     Baseline  no ROM yet- out of cast last week - 5 wlks s/p    Time  7    Period  Weeks    Status  New              Patient will benefit from skilled therapeutic intervention in order to improve the following deficits and impairments:  Decreased range of motion, Impaired flexibility, Decreased coordination, Decreased safety awareness, Increased edema, Impaired sensation, Decreased skin integrity, Decreased knowledge of precautions, Decreased knowledge of use of DME, Decreased scar mobility, Impaired UE functional use, Pain, Decreased strength  Visit Diagnosis: Stiffness of right hand, not elsewhere classified  Stiffness of right wrist, not elsewhere classified  Muscle weakness (generalized)    Problem List There are no active problems to display for this patient.  Gladyse Corvin Oneita Jolly, OTR/L, CLT  Grahm Etsitty 10/03/2017, 9:52 PM  [facility] [address], [place], [address] Phone: [phone]   Fax:  [phone]  Name: Francisco Johns MRN: [id number] Date of Birth: 14-Apr-1976

## 2017-10-04 ENCOUNTER — Ambulatory Visit: Payer: 59 | Admitting: Occupational Therapy

## 2017-10-04 DIAGNOSIS — M25641 Stiffness of right hand, not elsewhere classified: Secondary | ICD-10-CM

## 2017-10-04 DIAGNOSIS — M79601 Pain in right arm: Secondary | ICD-10-CM | POA: Diagnosis not present

## 2017-10-04 DIAGNOSIS — M25631 Stiffness of right wrist, not elsewhere classified: Secondary | ICD-10-CM

## 2017-10-04 DIAGNOSIS — M6281 Muscle weakness (generalized): Secondary | ICD-10-CM | POA: Diagnosis not present

## 2017-10-04 DIAGNOSIS — R208 Other disturbances of skin sensation: Secondary | ICD-10-CM | POA: Diagnosis not present

## 2017-10-04 DIAGNOSIS — L905 Scar conditions and fibrosis of skin: Secondary | ICD-10-CM | POA: Diagnosis not present

## 2017-10-08 ENCOUNTER — Ambulatory Visit: Payer: 59 | Admitting: Occupational Therapy

## 2017-10-08 DIAGNOSIS — M79601 Pain in right arm: Secondary | ICD-10-CM | POA: Diagnosis not present

## 2017-10-08 DIAGNOSIS — M25641 Stiffness of right hand, not elsewhere classified: Secondary | ICD-10-CM | POA: Diagnosis not present

## 2017-10-08 DIAGNOSIS — L905 Scar conditions and fibrosis of skin: Secondary | ICD-10-CM | POA: Diagnosis not present

## 2017-10-08 DIAGNOSIS — R208 Other disturbances of skin sensation: Secondary | ICD-10-CM | POA: Diagnosis not present

## 2017-10-08 DIAGNOSIS — M6281 Muscle weakness (generalized): Secondary | ICD-10-CM

## 2017-10-08 DIAGNOSIS — M25631 Stiffness of right wrist, not elsewhere classified: Secondary | ICD-10-CM | POA: Diagnosis not present

## 2017-10-11 ENCOUNTER — Ambulatory Visit: Payer: 59 | Admitting: Occupational Therapy

## 2017-10-11 DIAGNOSIS — M6281 Muscle weakness (generalized): Secondary | ICD-10-CM | POA: Diagnosis not present

## 2017-10-11 DIAGNOSIS — L905 Scar conditions and fibrosis of skin: Secondary | ICD-10-CM

## 2017-10-11 DIAGNOSIS — M25641 Stiffness of right hand, not elsewhere classified: Secondary | ICD-10-CM | POA: Diagnosis not present

## 2017-10-11 DIAGNOSIS — M79601 Pain in right arm: Secondary | ICD-10-CM

## 2017-10-11 DIAGNOSIS — M25631 Stiffness of right wrist, not elsewhere classified: Secondary | ICD-10-CM | POA: Diagnosis not present

## 2017-10-11 DIAGNOSIS — R208 Other disturbances of skin sensation: Secondary | ICD-10-CM

## 2017-10-12 ENCOUNTER — Encounter: Payer: Self-pay | Admitting: Occupational Therapy

## 2017-10-12 NOTE — Therapy (Signed)
[facility] [address] [place], [address] Phone: [phone]   Fax:  [phone]  Occupational Therapy Treatment  Patient Details  Name: Francisco Johns MRN: [id number] Date of Birth: December 21, 1975 Referring Provider: Serafina Royals   Encounter Date: 10/08/2017  OT End of Session - 10/12/17 1058    Visit Number  9    Number of Visits  18    Date for OT Re-Evaluation  11/05/17    OT Start Time  1100    OT Stop Time  1200    OT Time Calculation (min)  60 min    Activity Tolerance  Patient tolerated treatment well    Behavior During Therapy  [facility] for tasks assessed/performed       Past Medical History:  Diagnosis Date  . Metatarsal fracture    right big toe    Past Surgical History:  Procedure Laterality Date  . ankle surgery    . ORIF TOE FRACTURE Right 12/09/2015   Procedure: OPEN REDUCTION INTERNAL FIXATION (ORIF) METATARSAL (TOE) FRACTURE, first metatarsal;  Surgeon: Renette Butters, MD;  Location: [facility];  Service: Orthopedics;  Laterality: Right;  . 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  Subjective Assessment - 10/12/17 1056    Subjective   Patient reports he had a good weekend, feels some improvement in scar this week, still doing exercises at home.    Patient Stated Goals  I want to be able to get my movement in my R hand back and be able to bend the pinkie - I am R hand dominant     Currently in Pain?  No/denies    Pain Score  0-No pain         Right hand fingers wrapped in coban into flexion and moist heat applied for 5 minutes prior to exercises.  Patient seen for scar mobilizations to right wrist and forearm, manual soft tissue mob techniques from therapist with techniques to address adhesions - Use of vibration and manual therapy by OT - opposites directions as well as graston  brushing with tool nr 2 to volar surface of the forearm. Improvements noted this date   Performed PROM composite flexion of digits to palm , Followed by Block in palm and AROM MC  2-3 sec -100%, Block MC and AROM PIP  2-3 sec - 100%, 10 reps    Continued attempts with place and hold AROM limited by scar adhesion and pull 3rd  Thru 5th into extension, however this continues to improve Light blue putty continued with exercises, attempted teal putty however this is too resistive for patient at this point.   Kinesiotape applied over adhesion - in 3  stars pattern, pt to do at home during day   Thumb PA and RA place and hold with rubber band -     1# for wrist flexion/extension with emphasis on quality of movement and not quantity - 3 x 10 reps, occasional verbal cues.  Added functional hand exercises with golf balls this date for right hand: One ball using thumb to attempt to move across MPs towards ulnar side of the hand, cues and assist One ball using thumb to move up index and middle finger on radial side of hand Unable to perform ulnar side of the hand going up the fingers, still needs to work more on  thumb Difficulty with manipulation of 2 balls to rotate, cues for thumb activation and movement                  OT Education - 10/12/17 1057    Education provided  Yes    Education Details  functional hand exercises with golf ball and picking up objects    Person(s) Educated  Patient    Methods  Demonstration;Explanation    Comprehension  Returned demonstration       OT Short Term Goals - 10/12/17 1105      OT SHORT TERM GOAL #1   Title  Pt to be ind in HEP in use of splint , PROM for digits flexion and AROM initiate for digits and wrist  flexion and extention without pain     Baseline  Did not do any ROM up to now - out of cast last week     Time  2    Period  Weeks    Status  On-going      OT SHORT TERM GOAL #2   Title  Pt digits flexion improve PROM to touch palm  and AROM to 3 cm object out of palm     Baseline  initated this date PROM and AROM for flexion of digits - 5 wks s/p    Time  3    Period  Weeks    Status  On-going      OT SHORT TERM GOAL #3   Title  R wrist AROM improve to same as prior to surgery to be able to initiate strengthening     Baseline  NO PROM per protocol for wrist - out of cast last week - did not do any ROM     Time  3    Period  Weeks    Status  On-going        OT Long Term Goals - 10/12/17 1105      OT LONG TERM GOAL #1   Title  R digits flexion improve for pt to hold 1 cm object in palm -and 4th and 5th digit PIP more than 70 degrees     Baseline  out of cast last week - did not do any AROM or PROM yet     Time  6    Period  Weeks    Status  On-going      OT LONG TERM GOAL #2   Title  Grip strength increase in R hand to more than 10 lbs to carry and use hand in driving     Baseline  no strengthening yet - out of cast last week - 5 wks s/p     Period  Weeks    Status  On-going      OT LONG TERM GOAL #3   Title  Wrist strength increase for pt to do more than 2 lbs for wrist to prepare to carry and do yard work     Baseline  no ROM yet- out of cast last week - 5 wlks s/p    Time  7    Period  Weeks    Status  On-going            Plan - 10/12/17 1058    Clinical Impression Statement  Scar mobility improved this date along the scar line and decreased tightness in the volar forearm, decreased tenderness with use of mobs and graston tool.  Added functional hand exercises this date with use of golf  balls, attempts at rotating ball from one side of the hand to the other with moderate difficulty, one ball moving across MPs with thumb towards ulnar side of hand, one ball with attempts to facilitate movement with thumb up the radial side of the hand, up index and MF with some assistance, cannot perform on ulnar side of the hand. Verbal cues and some assistance required to complete new exercises, however patient  was able to activate more muscle groups and was a good focus for him and felt like he was working hard.  Continue to work towards goals in [place] performance deficits (Please refer to evaluation for details):  ADL's;IADL's;Play;Leisure;Work    Neurosurgeon    Current Impairments/barriers affecting progress:  had reattachment of hand last year , and now FDS repair with tenolysis     OT Frequency  2x / week    OT Duration  6 weeks    OT Treatment/Interventions  Self-care/ADL training;Fluidtherapy;Splinting;Therapeutic exercise;Passive range of motion;Manual Therapy;Scar mobilization;Patient/family education    Consulted and Agree with Plan of Care  Patient       Patient will benefit from skilled therapeutic intervention in order to improve the following deficits and impairments:  Decreased range of motion, Impaired flexibility, Decreased coordination, Decreased safety awareness, Increased edema, Impaired sensation, Decreased skin integrity, Decreased knowledge of precautions, Decreased knowledge of use of DME, Decreased scar mobility, Impaired UE functional use, Pain, Decreased strength  Visit Diagnosis: Stiffness of right hand, not elsewhere classified  Stiffness of right wrist, not elsewhere classified  Muscle weakness (generalized)  Other disturbances of skin sensation  Pain in right arm  Scar condition and fibrosis of skin    Problem List There are no active problems to display for this patient.  Aniesa Boback T Tomasita Morrow, OTR/L, CLT  Shaarav Ripple 10/12/2017, 11:05 AM  [facility] [address] [place], [address] Phone: [phone]   Fax:  [phone]  Name: JAKOTA MANTHEI MRN: [id number] Date of Birth: August 07, 1975

## 2017-10-12 NOTE — Therapy (Signed)
[facility] [address], [place], [address] Phone: [phone]   Fax:  [phone]  Occupational Therapy Treatment  Patient Details  Name: Francisco Johns MRN: [id number] Date of Birth: 21-Sep-1975 Referring Provider: Serafina Royals   Encounter Date: 10/04/2017  OT End of Session - 10/12/17 1047    Visit Number  8    Number of Visits  18    Date for OT Re-Evaluation  11/05/17    OT Start Time  0845    OT Stop Time  0943    OT Time Calculation (min)  58 min    Activity Tolerance  Patient tolerated treatment well    Behavior During Therapy  [facility] for tasks assessed/performed       Past Medical History:  Diagnosis Date  . Metatarsal fracture    right big toe    Past Surgical History:  Procedure Laterality Date  . ankle surgery    . ORIF TOE FRACTURE Right 12/09/2015   Procedure: OPEN REDUCTION INTERNAL FIXATION (ORIF) METATARSAL (TOE) FRACTURE, first metatarsal;  Surgeon: Renette Butters, MD;  Location: [facility];  Service: Orthopedics;  Laterality: Right;  . 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  Subjective Assessment - 10/12/17 1041    Subjective   Patient reports he is doing well with exercises at home.  Has tried 2# weight for wrist flex/ext at home but it is difficult    Patient Stated Goals  I want to be able to get my movement in my R hand back and be able to bend the pinkie - I am R hand dominant     Currently in Pain?  No/denies    Pain Score  0-No pain       Right hand fingers wrapped in coban into flexion and moist heat applied for 5 minutes prior to exercises.  Patient seen for scar mobilizations to right wrist and forearm, manual soft tissue mob techniques from therapist with techniques to address adhesions - Use of vibration and manual therapy by OT - opposites directions as well as  graston brushing with tool nr 2 to volar surface of the forearm.    Performed PROM composite flexion of digits to palm , Followed by Block in palm and AROM MC  2-3 sec -100%, Block MC and AROM PIP  2-3 sec - 100%, 10 reps    Continued attempts with place and hold AROM limited by scar adhesion and pull 3rd  Thru 5th into extension, however this continues to improve Light blue putty continued with exercises, attempted teal putty however this is too resistive for patient at this point.   Kinesiotape applied over adhesion - in 3  stars pattern, pt to do at home during day   Thumb PA and RA place and hold with rubber band -   Performed Pushing into wall for digits extension with arching of palm - and some flexion in digits including 5th,  Prayer stretch performed 10 reps, verbal cues for form, nerve glide for composite extension  stretch on wall with therapist demo and verbal instruction.    1# for wrist flexion/extension with emphasis on quality of movement and not quantity - 3 x 10 reps, occasional verbal cues.                     OT  Education - 10/12/17 1047    Education provided  Yes    Education Details  HEP    Person(s) Educated  Patient    Methods  Demonstration;Explanation    Comprehension  Returned demonstration       OT Short Term Goals - 10/01/17 0910      OT SHORT TERM GOAL #1   Title  Pt to be ind in HEP in use of splint , PROM for digits flexion and AROM initiate for digits and wrist  flexion and extention without pain     Baseline  Did not do any ROM up to now - out of cast last week     Time  2    Period  Weeks    Status  New      OT SHORT TERM GOAL #2   Title  Pt digits flexion improve PROM to touch palm and AROM to 3 cm object out of palm     Baseline  initated this date PROM and AROM for flexion of digits - 5 wks s/p    Time  3    Period  Weeks    Status  New      OT SHORT TERM GOAL #3   Title  R wrist AROM improve to same as prior to surgery  to be able to initiate strengthening     Baseline  NO PROM per protocol for wrist - out of cast last week - did not do any ROM     Time  3    Period  Weeks    Status  New        OT Long Term Goals - 10/01/17 0909      OT LONG TERM GOAL #1   Title  R digits flexion improve for pt to hold 1 cm object in palm -and 4th and 5th digit PIP more than 70 degrees     Baseline  out of cast last week - did not do any AROM or PROM yet     Time  6    Period  Weeks    Status  New      OT LONG TERM GOAL #2   Title  Grip strength increase in R hand to more than 10 lbs to carry and use hand in driving     Baseline  no strengthening yet - out of cast last week - 5 wks s/p     Period  Weeks    Status  New      OT LONG TERM GOAL #3   Title  Wrist strength increase for pt to do more than 2 lbs for wrist to prepare to carry and do yard work     Baseline  no ROM yet- out of cast last week - 5 wlks s/p    Time  7    Period  Weeks    Status  New            Plan - 10/12/17 1048    Clinical Impression Statement  Patient demonstrates difficulty with use of 2# for wrist flexion/ext with some mild pain when trying at home and needs to drop back to 1#.  He is able to perform a greater quality of movement and with greater ROM with the 1#, discussed the option of increasing  repetitions rather than increasing weight at this point.  He continues to do well with home exercises.  Scar is most limiting factor but is showing improvements with therapeutic  intervention.  Will continue to worktowards scar management, ROM, and strength with right hand to improve functional use.      Occupational performance deficits (Please refer to evaluation for details):  ADL's;IADL's;Play;Leisure;Work    Neurosurgeon    Current Impairments/barriers affecting progress:  had reattachment of hand last year , and now FDS repair with tenolysis     OT Frequency  2x / week    OT Duration  6 weeks    OT  Treatment/Interventions  Self-care/ADL training;Fluidtherapy;Splinting;Therapeutic exercise;Passive range of motion;Manual Therapy;Scar mobilization;Patient/family education    Consulted and Agree with Plan of Care  Patient       Patient will benefit from skilled therapeutic intervention in order to improve the following deficits and impairments:  Decreased range of motion, Impaired flexibility, Decreased coordination, Decreased safety awareness, Increased edema, Impaired sensation, Decreased skin integrity, Decreased knowledge of precautions, Decreased knowledge of use of DME, Decreased scar mobility, Impaired UE functional use, Pain, Decreased strength  Visit Diagnosis: Stiffness of right hand, not elsewhere classified  Stiffness of right wrist, not elsewhere classified  Muscle weakness (generalized)  Other disturbances of skin sensation  Pain in right arm  Scar condition and fibrosis of skin    Problem List There are no active problems to display for this patient.  Jermani Pund T Tomasita Morrow, OTR/L, CLT  Asaf Elmquist 10/12/2017, 10:54 AM  [facility] [address] [place], [address] Phone: [phone]   Fax:  [phone]  Name: LINH JOHANNES MRN: [id number] Date of Birth: 1975/09/17

## 2017-10-12 NOTE — Therapy (Signed)
[facility] [address], [place], [address] Phone: [phone]   Fax:  [phone]  Occupational Therapy Treatment  Patient Details  Name: Francisco Johns MRN: [id number] Date of Birth: [birth year] Referring Provider: Serafina Royals   Encounter Date: 10/11/2017  OT End of Session - 10/12/17 1114    Visit Number  10    Number of Visits  18    Date for OT Re-Evaluation  11/05/17    OT Start Time  0800    OT Stop Time  0858    OT Time Calculation (min)  58 min    Activity Tolerance  Patient tolerated treatment well    Behavior During Therapy  [name] for tasks assessed/performed       Past Medical History:  Diagnosis Date  . Metatarsal fracture    right big toe    Past Surgical History:  Procedure Laterality Date  . ankle surgery    . ORIF TOE FRACTURE Right 12/09/2015   Procedure: OPEN REDUCTION INTERNAL FIXATION (ORIF) METATARSAL (TOE) FRACTURE, first metatarsal;  Surgeon: Renette Butters, MD;  Location: [facility];  Service: Orthopedics;  Laterality: Right;  . 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  Subjective Assessment - 10/12/17 1110    Subjective   Patient continues to feel improvements in scar this week, still feels he cannot move up to 2# for wrist yet but is focusing on increasing in reps and maintaining greater motion with quality of movement.    Patient Stated Goals  I want to be able to get my movement in my R hand back and be able to bend the pinkie - I am R hand dominant     Currently in Pain?  No/denies    Pain Score  0-No pain       Right hand fingers wrapped in coban into flexion and moist heat applied for 5 minutes prior to exercises. Patient seen for scar mobilizations to right wrist and forearm, manual soft tissue mob techniques from therapist with techniques to address adhesions -  Use of vibration and manual therapy by OT - oppositesdirections as well as graston brushing with tool nr 2to volar surface of the forearm. Improvements noted this date  Performed PROM composite flexion of digits to palm , Followed by Block in palm and AROMMC2-3 sec -100%, Block MC and AROMPIP2-3 sec -100%, 10 reps  Continued attempts with place and hold AROM limited by scar adhesion and pull3rd Thru 5th into extension, however this continues to improve Patient continuing with 1# weight for wrist, light blue putty for hand Kinesiotape applied over adhesion - in3starspattern, pt to do at home during day  1# for wrist flexion/extension with emphasis on quality of movement and not quantity -3x 10 reps, occasional verbal cues. Functional hand exercises with golf balls this date for right hand: modified golf ball with coban wrap around for added traction One ball using thumb to attempt to move across MPs towards ulnar side of the hand, cues and assist One ball using thumb to move up index and middle finger on radial side of hand Pick up ball from lateral side, top with a variety of prehension patterns with cues Added 1/2 inch glass beads some with flat side up, others flat side down and picking up from tabletop with cues.  OT Education - 10/12/17 1114    Education provided  Yes    Education Details  functional hand exercises with golf ball and picking up objects    Person(s) Educated  Patient    Methods  Demonstration;Explanation    Comprehension  Returned demonstration       OT Short Term Goals - 10/12/17 1105      OT SHORT TERM GOAL #1   Title  Pt to be ind in HEP in use of splint , PROM for digits flexion and AROM initiate for digits and wrist  flexion and extention without pain     Baseline  Did not do any ROM up to now - out of cast last week     Time  2    Period  Weeks    Status  On-going      OT SHORT TERM GOAL #2    Title  Pt digits flexion improve PROM to touch palm and AROM to 3 cm object out of palm     Baseline  initated this date PROM and AROM for flexion of digits - 5 wks s/p    Time  3    Period  Weeks    Status  On-going      OT SHORT TERM GOAL #3   Title  R wrist AROM improve to same as prior to surgery to be able to initiate strengthening     Baseline  NO PROM per protocol for wrist - out of cast last week - did not do any ROM     Time  3    Period  Weeks    Status  On-going        OT Long Term Goals - 10/12/17 1105      OT LONG TERM GOAL #1   Title  R digits flexion improve for pt to hold 1 cm object in palm -and 4th and 5th digit PIP more than 70 degrees     Baseline  out of cast last week - did not do any AROM or PROM yet     Time  6    Period  Weeks    Status  On-going      OT LONG TERM GOAL #2   Title  Grip strength increase in R hand to more than 10 lbs to carry and use hand in driving     Baseline  no strengthening yet - out of cast last week - 5 wks s/p     Period  Weeks    Status  On-going      OT LONG TERM GOAL #3   Title  Wrist strength increase for pt to do more than 2 lbs for wrist to prepare to carry and do yard work     Baseline  no ROM yet- out of cast last week - 5 wlks s/p    Time  7    Period  Weeks    Status  On-going            Plan - 10/12/17 1115    Clinical Impression Statement  Patient continues to make good progress and softening of scar with increased mobility this week.  Added some functional hand exercises this week with modifications and patient will continue to work on at home.  Continue with 1# weight for wrist exercises with emphasis on range of motion and quality of movement, increase reps to 12-15 for 2  sets.  Continue light blue putty and will reassess  next week to see if he can advance to teal.     Occupational performance deficits (Please refer to evaluation for details):  ADL's;IADL's;Play;Leisure;Work    Neurosurgeon     Current Impairments/barriers affecting progress:  had reattachment of hand last year , and now FDS repair with tenolysis     OT Frequency  2x / week    OT Duration  6 weeks    OT Treatment/Interventions  Self-care/ADL training;Fluidtherapy;Splinting;Therapeutic exercise;Passive range of motion;Manual Therapy;Scar mobilization;Patient/family education    Consulted and Agree with Plan of Care  Patient       Patient will benefit from skilled therapeutic intervention in order to improve the following deficits and impairments:  Decreased range of motion, Impaired flexibility, Decreased coordination, Decreased safety awareness, Increased edema, Impaired sensation, Decreased skin integrity, Decreased knowledge of precautions, Decreased knowledge of use of DME, Decreased scar mobility, Impaired UE functional use, Pain, Decreased strength  Visit Diagnosis: Stiffness of right hand, not elsewhere classified  Stiffness of right wrist, not elsewhere classified  Muscle weakness (generalized)  Other disturbances of skin sensation  Pain in right arm  Scar condition and fibrosis of skin    Problem List There are no active problems to display for this patient.  Amy T Tomasita Morrow, OTR/L, CLT  Lovett,Amy 10/12/2017, 11:27 AM  [facility] [address] [place], [address] Phone: [phone]   Fax:  [phone]  Name: Francisco Johns MRN: [id number] Date of Birth: 16-Aug-1975

## 2017-10-14 ENCOUNTER — Ambulatory Visit: Payer: 59 | Admitting: Occupational Therapy

## 2017-10-14 DIAGNOSIS — M25641 Stiffness of right hand, not elsewhere classified: Secondary | ICD-10-CM

## 2017-10-14 DIAGNOSIS — M79601 Pain in right arm: Secondary | ICD-10-CM | POA: Diagnosis not present

## 2017-10-14 DIAGNOSIS — R208 Other disturbances of skin sensation: Secondary | ICD-10-CM | POA: Diagnosis not present

## 2017-10-14 DIAGNOSIS — M6281 Muscle weakness (generalized): Secondary | ICD-10-CM | POA: Diagnosis not present

## 2017-10-14 DIAGNOSIS — L905 Scar conditions and fibrosis of skin: Secondary | ICD-10-CM

## 2017-10-14 DIAGNOSIS — M25631 Stiffness of right wrist, not elsewhere classified: Secondary | ICD-10-CM

## 2017-10-17 ENCOUNTER — Encounter: Payer: Self-pay | Admitting: Occupational Therapy

## 2017-10-17 NOTE — Therapy (Signed)
[facility] [address] [place], [address] Phone: [phone]   Fax:  [phone]  Occupational Therapy Treatment  Patient Details  Name: Francisco Johns MRN: [id number] Date of Birth: 01/26/1976 Referring Provider: Serafina Royals   Encounter Date: 10/14/2017  OT End of Session - 10/17/17 1921    Visit Number  11    Number of Visits  18    Date for OT Re-Evaluation  11/05/17    OT Start Time  0830    OT Stop Time  0915    OT Time Calculation (min)  45 min    Activity Tolerance  Patient tolerated treatment well    Behavior During Therapy  [facility] for tasks assessed/performed       Past Medical History:  Diagnosis Date  . Metatarsal fracture    right big toe    Past Surgical History:  Procedure Laterality Date  . ankle surgery    . ORIF TOE FRACTURE Right 12/09/2015   Procedure: OPEN REDUCTION INTERNAL FIXATION (ORIF) METATARSAL (TOE) FRACTURE, first metatarsal;  Surgeon: Renette Butters, MD;  Location: [facility];  Service: Orthopedics;  Laterality: Right;  . 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  Subjective Assessment - 10/17/17 1920    Subjective   Patient reports he is doing well, no complaints    Patient Stated Goals  I want to be able to get my movement in my R hand back and be able to bend the pinkie - I am R hand dominant     Currently in Pain?  No/denies    Pain Score  0-No pain         Right hand fingers wrapped in coban into flexion and moist heat applied for 5 minutes prior to exercises.  Patient seen for scar mobilizations to right wrist and forearm, manual soft tissue mob techniques from therapist with techniques to address adhesions, opposite directions as well as graston brushing with tool nr 2 to volar surface of the forearm.    Performed PROM composite flexion of digits to palm ,  Followed by Block in palm and AROM MC  2-3 sec -100%, Block MC and AROM PIP  2-3 sec - 100%, 10 reps    Continued attempts with place and hold AROM limited by scar adhesion and pull 3rd  Thru 5th into extension, however this continues to improve Patient continuing with 1# weight for wrist, light blue putty for hand Kinesiotape applied over adhesion - in 3  stars pattern, pt to do at home during day    1# for wrist flexion/extension with emphasis on quality of movement and not quantity - 3 x 10 reps, occasional verbal cues. Supinationpron 3# with modified hold for greater lever arm rather than increasing weight. 2 sets, 10 reps.  UD/RD upgraded to 4# performed in standing for 10 reps for 2 sets Functional hand exercises with golf balls this date for right hand: modified golf ball with coban wrap around for added traction One ball using thumb to attempt to move across MPs towards ulnar side of the hand, cues and assist One ball using thumb to move up index and middle finger on radial side of hand Pick up ball from lateral side, top with a variety of prehension patterns with cues 1/2 inch glass beads some with flat side up, others flat  side down and picking up from tabletop with cues, this session focus on tilting bead to the side and picking up in vertical position working on prehension patterns.                    OT Education - 10/17/17 1921    Education provided  Yes    Education Details  functional hand exercises    Person(s) Educated  Patient    Methods  Demonstration;Explanation    Comprehension  Returned demonstration       OT Short Term Goals - 10/12/17 1105      OT SHORT TERM GOAL #1   Title  Pt to be ind in HEP in use of splint , PROM for digits flexion and AROM initiate for digits and wrist  flexion and extention without pain     Baseline  Did not do any ROM up to now - out of cast last week     Time  2    Period  Weeks    Status  On-going      OT SHORT TERM  GOAL #2   Title  Pt digits flexion improve PROM to touch palm and AROM to 3 cm object out of palm     Baseline  initated this date PROM and AROM for flexion of digits - 5 wks s/p    Time  3    Period  Weeks    Status  On-going      OT SHORT TERM GOAL #3   Title  R wrist AROM improve to same as prior to surgery to be able to initiate strengthening     Baseline  NO PROM per protocol for wrist - out of cast last week - did not do any ROM     Time  3    Period  Weeks    Status  On-going        OT Long Term Goals - 10/12/17 1105      OT LONG TERM GOAL #1   Title  R digits flexion improve for pt to hold 1 cm object in palm -and 4th and 5th digit PIP more than 70 degrees     Baseline  out of cast last week - did not do any AROM or PROM yet     Time  6    Period  Weeks    Status  On-going      OT LONG TERM GOAL #2   Title  Grip strength increase in R hand to more than 10 lbs to carry and use hand in driving     Baseline  no strengthening yet - out of cast last week - 5 wks s/p     Period  Weeks    Status  On-going      OT LONG TERM GOAL #3   Title  Wrist strength increase for pt to do more than 2 lbs for wrist to prepare to carry and do yard work     Baseline  no ROM yet- out of cast last week - 5 wlks s/p    Time  7    Period  Weeks    Status  On-going            Plan - 10/17/17 1922    Clinical Impression Statement  Good progress with scar mobility, upgraded exercises this date as noted above for home program.  Continues to work towards increasing quality of movements, improving strength and functional  hand skills.  Continue to add to prehension patterns for picking up and manipulating objects for coactivation of muscles.  Will add exercises in the next week with BTE.    Occupational performance deficits (Please refer to evaluation for details):  ADL's;IADL's;Play;Leisure;Work    Rehab Potential  Good    OT Frequency  2x / week    OT Duration  6 weeks    OT  Treatment/Interventions  Self-care/ADL training;Fluidtherapy;Splinting;Therapeutic exercise;Passive range of motion;Manual Therapy;Scar mobilization;Patient/family education    Consulted and Agree with Plan of Care  Patient       Patient will benefit from skilled therapeutic intervention in order to improve the following deficits and impairments:  Decreased range of motion, Impaired flexibility, Decreased coordination, Decreased safety awareness, Increased edema, Impaired sensation, Decreased skin integrity, Decreased knowledge of precautions, Decreased knowledge of use of DME, Decreased scar mobility, Impaired UE functional use, Pain, Decreased strength  Visit Diagnosis: Stiffness of right hand, not elsewhere classified  Stiffness of right wrist, not elsewhere classified  Muscle weakness (generalized)  Other disturbances of skin sensation  Pain in right arm  Scar condition and fibrosis of skin    Problem List There are no active problems to display for this patient.  Trinna Kunst Oneita Jolly, OTR/L, CLT  Kerrie Latour 10/17/2017, 7:31 PM  [facility] [address], [place], [address] Phone: [phone]   Fax:  [phone]  Name: Francisco Johns MRN: [id number] Date of Birth: 29-Jan-1976

## 2017-10-18 ENCOUNTER — Encounter: Payer: Self-pay | Admitting: Family Medicine

## 2017-10-18 ENCOUNTER — Encounter: Payer: Self-pay | Admitting: Occupational Therapy

## 2017-10-18 ENCOUNTER — Ambulatory Visit: Payer: 59 | Admitting: Occupational Therapy

## 2017-10-18 ENCOUNTER — Ambulatory Visit: Payer: Self-pay | Admitting: Family Medicine

## 2017-10-18 VITALS — BP 140/98 | HR 88 | Temp 98.6°F | Ht 68.0 in | Wt 181.0 lb

## 2017-10-18 DIAGNOSIS — R208 Other disturbances of skin sensation: Secondary | ICD-10-CM | POA: Diagnosis not present

## 2017-10-18 DIAGNOSIS — M25631 Stiffness of right wrist, not elsewhere classified: Secondary | ICD-10-CM

## 2017-10-18 DIAGNOSIS — L905 Scar conditions and fibrosis of skin: Secondary | ICD-10-CM

## 2017-10-18 DIAGNOSIS — M79601 Pain in right arm: Secondary | ICD-10-CM

## 2017-10-18 DIAGNOSIS — M6281 Muscle weakness (generalized): Secondary | ICD-10-CM

## 2017-10-18 DIAGNOSIS — M25641 Stiffness of right hand, not elsewhere classified: Secondary | ICD-10-CM | POA: Diagnosis not present

## 2017-10-18 DIAGNOSIS — Z0000 Encounter for general adult medical examination without abnormal findings: Secondary | ICD-10-CM

## 2017-10-18 LAB — POCT URINALYSIS DIPSTICK
BILIRUBIN UA: NEGATIVE
GLUCOSE UA: NEGATIVE
Ketones, UA: NEGATIVE
LEUKOCYTES UA: NEGATIVE
Nitrite, UA: NEGATIVE
Protein, UA: NEGATIVE
Spec Grav, UA: 1.015 (ref 1.010–1.025)
Urobilinogen, UA: 0.2 E.U./dL
pH, UA: 5.5 (ref 5.0–8.0)

## 2017-10-18 NOTE — Therapy (Signed)
[facility] [address], [place], [address] Phone: [phone]   Fax:  [phone]  Occupational Therapy Treatment  Patient Details  Name: Francisco Johns MRN: [id number] Date of Birth: Feb 09, 1976 Referring Provider: Serafina Royals   Encounter Date: 10/18/2017  OT End of Session - 10/18/17 1040    Visit Number  12    Number of Visits  18    Date for OT Re-Evaluation  11/05/17    OT Start Time  0915    OT Stop Time  1005    OT Time Calculation (min)  50 min    Activity Tolerance  Patient tolerated treatment well    Behavior During Therapy  [facility] for tasks assessed/performed       Past Medical History:  Diagnosis Date  . Metatarsal fracture    right big toe    Past Surgical History:  Procedure Laterality Date  . ankle surgery    . ORIF TOE FRACTURE Right 12/09/2015   Procedure: OPEN REDUCTION INTERNAL FIXATION (ORIF) METATARSAL (TOE) FRACTURE, first metatarsal;  Surgeon: Renette Butters, MD;  Location: [facility];  Service: Orthopedics;  Laterality: Right;  . 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  Subjective Assessment - 10/18/17 0922    Subjective   Reports he went to see the doctor this week, surgeon was pleased with his progress and he is doing better than most.  MD said that any more progress he sees will be a bonus.     Patient Stated Goals  I want to be able to get my movement in my R hand back and be able to bend the pinkie - I am R hand dominant     Currently in Pain?  No/denies    Pain Score  0-No pain           Right hand fingers wrapped in coban into flexion and moist heat applied for 5 minutes prior to exercises. Patient seen for scar mobilizations to right wrist and forearm, manual soft tissue mob techniques from therapist with techniques to address adhesions, oppositedirections as  well as graston brushing with tool nr 2to volar surface of the forearm.Added extractor this date along scarline at various points to manage scar.   Performed PROM composite flexion of digits to palm , Followed by Block in palm and AROMMC2-3 sec -100%, Block MC and AROMPIP2-3 sec -100%, 10 reps  Continued attempts with place and hold AROM limited by scar adhesion and pull3rd Thru 5th into extension, however this continues to improve   1# for wrist flexion/extension with emphasis on quality of movement and not quantity -2x 10 reps, occasional verbal cues.  Patient continuing with 1# weight for wrist, advanced to 2# today with good form and range for 5 reps for 2 sets  Supinationpron 3# with modified hold for greater lever arm rather than increasing weight. 3 sets, 10 reps.  UD/RD 3# performed in standing for 10 reps for 3 sets Functional hand exercises with use of [place] with thumb and middle finger prehension patterns and oppositional movements, cues for technique, multiple trials performed.  Resistive velcro squares for emphasis on prehension patterns as well as wrist extension with cues to remove from board.                      OT  Education - 10/18/17 1040    Education provided  Yes    Education Details  functional hand exercises with [place] disc, HEP    Person(s) Educated  Patient    Methods  Demonstration;Explanation    Comprehension  Returned demonstration       OT Short Term Goals - 10/12/17 1105      OT SHORT TERM GOAL #1   Title  Pt to be ind in HEP in use of splint , PROM for digits flexion and AROM initiate for digits and wrist  flexion and extention without pain     Baseline  Did not do any ROM up to now - out of cast last week     Time  2    Period  Weeks    Status  On-going      OT SHORT TERM GOAL #2   Title  Pt digits flexion improve PROM to touch palm and AROM to 3 cm object out of palm     Baseline  initated this date  PROM and AROM for flexion of digits - 5 wks s/p    Time  3    Period  Weeks    Status  On-going      OT SHORT TERM GOAL #3   Title  R wrist AROM improve to same as prior to surgery to be able to initiate strengthening     Baseline  NO PROM per protocol for wrist - out of cast last week - did not do any ROM     Time  3    Period  Weeks    Status  On-going        OT Long Term Goals - 10/12/17 1105      OT LONG TERM GOAL #1   Title  R digits flexion improve for pt to hold 1 cm object in palm -and 4th and 5th digit PIP more than 70 degrees     Baseline  out of cast last week - did not do any AROM or PROM yet     Time  6    Period  Weeks    Status  On-going      OT LONG TERM GOAL #2   Title  Grip strength increase in R hand to more than 10 lbs to carry and use hand in driving     Baseline  no strengthening yet - out of cast last week - 5 wks s/p     Period  Weeks    Status  On-going      OT LONG TERM GOAL #3   Title  Wrist strength increase for pt to do more than 2 lbs for wrist to prepare to carry and do yard work     Baseline  no ROM yet- out of cast last week - 5 wlks s/p    Time  7    Period  Weeks    Status  On-going            Plan - 10/18/17 1041    Clinical Impression Statement  Patient reports MD was pleased with his progress so far with therapy and recommended he continue 2 times a week.  Patient's scar continues to progress with increased scar mobility and decreased sensitivity.  Patient continues to work with exercises at home, with UD/RD and supination/ext he is using 3# and able to perform 3 sets of 10.  Wrist flexion/ext he has still been using 1# but was able to move  up today to do 2# with less reps and good form.  Still needs some assist with ring and small finger for flexion when using weight.  Utilized resistive velcro squares with focus on wrist extension this date with cues to remove and then worked towards oppositional movements to place.  Instructed on  use of [place] discs for 2 prehension pattern holds with middle finger and thumb promoting oppositional movements. Continue to work towards goals to increase functional use of right hand, BTE next week.     Occupational performance deficits (Please refer to evaluation for details):  ADL's;IADL's;Play;Leisure;Work    Neurosurgeon    Current Impairments/barriers affecting progress:  had reattachment of hand last year , and now FDS repair with tenolysis     OT Frequency  2x / week    OT Duration  6 weeks    OT Treatment/Interventions  Self-care/ADL training;Fluidtherapy;Splinting;Therapeutic exercise;Passive range of motion;Manual Therapy;Scar mobilization;Patient/family education    Consulted and Agree with Plan of Care  Patient       Patient will benefit from skilled therapeutic intervention in order to improve the following deficits and impairments:  Decreased range of motion, Impaired flexibility, Decreased coordination, Decreased safety awareness, Increased edema, Impaired sensation, Decreased skin integrity, Decreased knowledge of precautions, Decreased knowledge of use of DME, Decreased scar mobility, Impaired UE functional use, Pain, Decreased strength  Visit Diagnosis: Stiffness of right hand, not elsewhere classified  Stiffness of right wrist, not elsewhere classified  Muscle weakness (generalized)  Other disturbances of skin sensation  Pain in right arm  Scar condition and fibrosis of skin    Problem List There are no active problems to display for this patient.  Sire Poet T Tomasita Morrow, OTR/L, CLT  Laquesha Holcomb 10/18/2017, 10:51 AM  [facility] [address], [place], [address] Phone: [phone]   Fax:  [phone]  Name: Francisco Johns MRN: [id number] Date of Birth: 08/12/1975

## 2017-10-18 NOTE — Progress Notes (Signed)
Patient ID: Francisco Johns, male    DOB: 18-Jul-1975, 42 y.o.   MRN: [id number]  PCP: No primary care provider on file.  Chief Complaint  Patient presents with  . save-wellness exam    Subjective:  HPI Francisco Johns is a 42 y.o. male presents for wellness visit in order to satisfy employee sponsored health insurance benefits. 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  He suffered a trauma in which he accidentally severed his wrist while cutting wood in 2018.  He has regained 75% use of his right wrist and hand with aggressive rehabilitation.  He admits to some extra excess weight gain as he has been unable to completely returned to work since his injury.  He reports no routine physical exercise and no dietary restrictions. He denies any known knowledge of family history significant for cardiovascular disease, diabetes, cancers.  He denies any recent episodes of shortness of breath, chest pain, headaches, dizziness, new weakness.  He denies any knowledge of high blood pressure although his blood pressure is elevated on arrival today.  He denies any complaints today. Social History   Socioeconomic History  . Marital status: Married    Spouse name: Not on file  . Number of children: Not on file  . Years of education: Not on file  . Highest education level: Not on file  Occupational History  . Not on file  Social Needs  . Financial resource strain: Not on file  . Food insecurity:    Worry: Not on file    Inability: Not on file  . Transportation needs:    Medical: Not on file    Non-medical: Not on file  Tobacco Use  . Smoking status: Never Smoker  . Smokeless tobacco: Never Used  Substance and Sexual Activity  . Alcohol use: No    Comment: social  . Drug use: No  . Sexual activity: Not on file  Lifestyle  . Physical activity:    Days per week: Not on file   Minutes per session: Not on file  . Stress: Not on file  Relationships  . Social connections:    Talks on phone: Not on file    Gets together: Not on file    Attends religious service: Not on file    Active member of club or organization: Not on file    Attends meetings of clubs or organizations: Not on file    Relationship status: Not on file  . Intimate partner violence:    Fear of current or ex partner: Not on file    Emotionally abused: Not on file    Physically abused: Not on file    Forced sexual activity: Not on file  Other Topics Concern  . Not on file  Social History Narrative   ** Merged History Encounter **        No family history on file.  Review of Systems Constitutional: Negative for fever, chills, diaphoresis, activity change, appetite change and fatigue. HENT: Negative for ear pain, nosebleeds, congestion, facial swelling, rhinorrhea, neck pain, neck stiffness and ear discharge.  Eyes: Negative for pain, discharge, redness, itching and visual disturbance. Respiratory: Negative for cough, choking, chest tightness, shortness of breath, wheezing and stridor.  Cardiovascular: Negative for chest pain, palpitations and leg swelling. Gastrointestinal: Negative for abdominal distention. Musculoskeletal: Negative for back pain, joint swelling, arthralgia and gait problem. Neurological: Negative  for dizziness, tremors, seizures, syncope, facial asymmetry, speech difficulty, weakness, light-headedness, numbness and headaches.  There are no active problems to display for this patient.  No Known Allergies  Prior to Admission medications   Medication Sig Start Date End Date Taking? Authorizing Provider  aspirin EC 81 MG tablet Take 1 tablet (81 mg total) by mouth daily. Patient not taking: Reported on 10/18/2017 12/09/15   Renette Butters, MD  docusate sodium (COLACE) 100 MG capsule Take 1 capsule (100 mg total) by mouth 2 (two) times daily. To prevent constipation while  taking pain medication. Patient not taking: Reported on 10/18/2017 12/09/15   Renette Butters, MD  methocarbamol (ROBAXIN) 500 MG tablet Take 1 tablet (500 mg total) by mouth every 6 (six) hours as needed for muscle spasms. Patient not taking: Reported on 10/18/2017 12/09/15   Renette Butters, MD  omeprazole (PRILOSEC) 20 MG capsule Take 1 capsule (20 mg total) by mouth daily. While taking anti inflammatory medicine daily Patient not taking: Reported on 10/18/2017 12/09/15   Renette Butters, MD  ondansetron (ZOFRAN) 4 MG tablet Take 1 tablet (4 mg total) by mouth every 8 (eight) hours as needed for nausea or vomiting. Patient not taking: Reported on 10/18/2017 12/09/15   Renette Butters, MD  oxyCODONE-acetaminophen (ROXICET) 5-325 MG tablet Take 1-2 tablets by mouth every 4 (four) hours as needed for severe pain. Patient not taking: Reported on 10/18/2017 12/09/15   Renette Butters, MD    Past Medical, Surgical Family and Social History reviewed and updated.    Objective:   Today's Vitals   10/18/17 1210  BP: (!) 140/98  Pulse: 88  Temp: 98.6 F (37 C)  SpO2: 98%  Weight: 181 lb (82.1 kg)  Height: 5\' 8"  (1.727 m)    Wt Readings from Last 3 Encounters:  10/18/17 181 lb (82.1 kg)  05/28/16 172 lb (78 kg)  12/09/15 169 lb (76.7 kg)    Physical Exam Constitutional: Patient appears well-developed and well-nourished. No distress. HENT: Normocephalic, atraumatic, External right and left ear normal. Oropharynx is clear and moist.  Eyes: Conjunctivae and EOM are normal. PERRLA, no scleral icterus. Neck: Normal ROM. Neck supple. No JVD. No tracheal deviation. No thyromegaly. CVS: RRR, S1/S2 +, no murmurs, no gallops, no carotid bruit.  Pulmonary: Effort and breath sounds normal, no stridor, rhonchi, wheezes, rales.  Abdominal: Soft. BS +, no distension, tenderness, rebound or guarding.  Musculoskeletal: Normal range of motion. No edema and no tenderness.  Neuro: Alert. Normal  reflexes, muscle tone coordination. Weakness-right wrist and digit movement Skin: Skin is warm and dry. No rash noted. Not diaphoretic. No erythema. No pallor. Psychiatric: Normal mood and affect. Behavior, judgment, thought content normal.   Assessment & Plan:  1. Wellness examination Age-appropriate anticipatory guidance provided  Encouraged efforts to reduce weight include engaging in physical activity as tolerated with goal of 150 minutes per week. Improve dietary choices and eat a meal regimen consistent with a Mediterranean or DASH diet. Reduce simple carbohydrates. Do not skip meals and eat healthy snacks throughout the day to avoid over-eating at dinner. Set a goal weight loss that is achievable for you. Follow-up with PCP to obtain routine screening labs. Provided information to establish with a primary care provider.  If symptoms worsen or do not improve, return for follow-up, follow-up with PCP, or at the emergency department if severity of symptoms warrant a higher level of care.    Carroll Sage. Kenton Kingfisher, MSN, FNP-C [employer]  [place] [place], [place] [address] [phone]

## 2017-10-18 NOTE — Patient Instructions (Signed)
The following are primary care providers in [facility]:  [facility] Shel[facility] Flake [phone] [facility] [place], [facility] A healthy lifestyle and preventive care is important for your health and wellness. Ask your health care provider about what schedule of regular examinations is right for you. What should I know about weight and diet? Eat a Healthy Diet  Eat plenty of vegetables, fruits, whole grains, low-fat dairy products, and lean protein.  Do not eat a lot of foods high in solid fats, added sugars, or salt.  Maintain a Healthy Weight Regular exercise can help you achieve or maintain a healthy weight. You should:  Do at least 150 minutes of exercise each week. The exercise should increase your heart rate and make you sweat (moderate-intensity exercise).  Do strength-training exercises at least twice a week.  Watch Your Levels of Cholesterol and Blood Lipids  Have your blood tested for lipids and cholesterol every 5 years starting at 42 years of age. If you are at high risk for heart disease, you should start having your blood tested when you are 42 years old. You may need to have your cholesterol levels checked more often if: ? Your lipid or cholesterol levels are high. ? You are older than 42 years of age. ? You are at high risk for heart disease.  What should I know about cancer screening? Many types of cancers can be detected early and may often be prevented. Lung Cancer  You should be screened every year for lung cancer if: ? You are a current smoker who has smoked for at least 30 years. ? You are a former smoker who has quit within the past 15 years.  Talk to your health care provider about your screening options, when you should start screening, and how often you should be screened.  Colorectal Cancer  Routine colorectal cancer screening usually begins at 42 years of age and should be repeated every 5-10  years until you are 42 years old. You may need to be screened more often if early forms of precancerous polyps or small growths are found. Your health care provider may recommend screening at an earlier age if you have risk factors for colon cancer.  Your health care provider may recommend using home test kits to check for hidden blood in the stool.  A small camera at the end of a tube can be used to examine your colon (sigmoidoscopy or colonoscopy). This checks for the earliest forms of colorectal cancer.  Prostate and Testicular Cancer  Depending on your age and overall health, your health care provider may do certain tests to screen for prostate and testicular cancer.  Talk to your health care provider about any symptoms or concerns you have about testicular or prostate cancer.  Skin Cancer  Check your skin from head to toe regularly.  Tell your health care provider about any new moles or changes in moles, especially if: ? There is a change in a mole's size, shape, or color. ? You have a mole that is larger than a pencil eraser.  Always use sunscreen. Apply sunscreen liberally and repeat throughout the day.  Protect yourself by wearing long sleeves, pants, a wide-brimmed hat, and sunglasses when outside.  What should I know about heart disease, diabetes, and high blood pressure?  If you are 45-45 years of age, have your blood pressure checked every 3-5 years. If you are 34 years of age or older, have  your blood pressure checked every year. You should have your blood pressure measured twice-once when you are at a hospital or clinic, and once when you are not at a hospital or clinic. Record the average of the two measurements. To check your blood pressure when you are not at a hospital or clinic, you can use: ? An automated blood pressure machine at a pharmacy. ? A home blood pressure monitor.  Talk to your health care provider about your target blood pressure.  If you are between  45-79 years old, ask your health care provider if you should take aspirin to prevent heart disease.  Have regular diabetes screenings by checking your fasting blood sugar level. ? If you are at a normal weight and have a low risk for diabetes, have this test once every three years after the age of 45. ? If you are overweight and have a high risk for diabetes, consider being tested at a younger age or more often.  A one-time screening for abdominal aortic aneurysm (AAA) by ultrasound is recommended for men aged 65-75 years who are current or former smokers. What should I know about preventing infection? Hepatitis B If you have a higher risk for hepatitis B, you should be screened for this virus. Talk with your health care provider to find out if you are at risk for hepatitis B infection. Hepatitis C Blood testing is recommended for:  Everyone born from 1945 through 1965.  Anyone with known risk factors for hepatitis C.  Sexually Transmitted Diseases (STDs)  You should be screened each year for STDs including gonorrhea and chlamydia if: ? You are sexually active and are younger than 42 years of age. ? You are older than 42 years of age and your health care provider tells you that you are at risk for this type of infection. ? Your sexual activity has changed since you were last screened and you are at an increased risk for chlamydia or gonorrhea. Ask your health care provider if you are at risk.  Talk with your health care provider about whether you are at high risk of being infected with HIV. Your health care provider may recommend a prescription medicine to help prevent HIV infection.  What else can I do?  Schedule regular health, dental, and eye exams.  Stay current with your vaccines (immunizations).  Do not use any tobacco products, such as cigarettes, chewing tobacco, and e-cigarettes. If you need help quitting, ask your health care provider.  Limit alcohol intake to no more than  2 drinks per day. One drink equals 12 ounces of beer, 5 ounces of wine, or 1 ounces of hard liquor.  Do not use street drugs.  Do not share needles.  Ask your health care provider for help if you need support or information about quitting drugs.  Tell your health care provider if you often feel depressed.  Tell your health care provider if you have ever been abused or do not feel safe at home. This information is not intended to replace advice given to you by your health care provider. Make sure you discuss any questions you have with your health care provider. Document Released: 10/06/2007 Document Revised: 12/07/2015 Document Reviewed: 01/11/2015 Elsevier Interactive Patient Education  2018 [employer] Inc[employer]  

## 2017-10-21 ENCOUNTER — Encounter: Payer: Self-pay | Admitting: Occupational Therapy

## 2017-10-21 ENCOUNTER — Ambulatory Visit: Payer: 59 | Attending: Plastic Surgery | Admitting: Occupational Therapy

## 2017-10-21 DIAGNOSIS — L905 Scar conditions and fibrosis of skin: Secondary | ICD-10-CM | POA: Diagnosis not present

## 2017-10-21 DIAGNOSIS — M79601 Pain in right arm: Secondary | ICD-10-CM | POA: Diagnosis not present

## 2017-10-21 DIAGNOSIS — M25631 Stiffness of right wrist, not elsewhere classified: Secondary | ICD-10-CM | POA: Insufficient documentation

## 2017-10-21 DIAGNOSIS — M6281 Muscle weakness (generalized): Secondary | ICD-10-CM | POA: Insufficient documentation

## 2017-10-21 DIAGNOSIS — M25641 Stiffness of right hand, not elsewhere classified: Secondary | ICD-10-CM | POA: Diagnosis not present

## 2017-10-21 DIAGNOSIS — R208 Other disturbances of skin sensation: Secondary | ICD-10-CM | POA: Insufficient documentation

## 2017-10-21 NOTE — Therapy (Signed)
[facility] [address] [place], [address] Phone: [phone]   Fax:  [phone]  Occupational Therapy Treatment  Patient Details  Name: Francisco Johns MRN: [id number] Date of Birth: [birth year] Referring Provider: Serafina Royals   Encounter Date: 10/21/2017  OT End of Session - 10/21/17 0925    Visit Number  13    Number of Visits  18    Date for OT Re-Evaluation  11/05/17    OT Start Time  0916    OT Stop Time  1005    OT Time Calculation (min)  49 min    Activity Tolerance  Patient tolerated treatment well    Behavior During Therapy  [facility] for tasks assessed/performed       Past Medical History:  Diagnosis Date  . Metatarsal fracture    right big toe    Past Surgical History:  Procedure Laterality Date  . ankle surgery    . ORIF TOE FRACTURE Right 12/09/2015   Procedure: OPEN REDUCTION INTERNAL FIXATION (ORIF) METATARSAL (TOE) FRACTURE, first metatarsal;  Surgeon: Renette Butters, MD;  Location: [facility];  Service: Orthopedics;  Laterality: Right;  . 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  Subjective Assessment - 10/21/17 0922    Subjective   Pt had to go for a physical last week, blood pressure was slightly elevated.  BP measured this date 130/87.  Son starts swimming lessons today.      Patient Stated Goals  I want to be able to get my movement in my R hand back and be able to bend the pinkie - I am R hand dominant     Currently in Pain?  No/denies    Pain Score  0-No pain       Right hand fingers wrapped in coban into flexion and moist heat applied for 5 minutes prior to exercises. Patient seen for scar mobilizations to right wrist and forearm, manual soft tissue mob techniques from therapist with techniques to address adhesions, opposite directions as well as graston brushing with tool nr 2  to volar surface of the forearm.    Performed PROM composite flexion of digits to palm  Continued attempts with place and hold AROM limited by scar adhesion and pull 3rd  Thru 5th into extension, continues to improve   Kinesiotape applied over adhesion - in 3  stars pattern, pt to do at home during day  Performed Pushing into wall for digits extension with arching of palm - and some flexion in digits including 5th,  Prayer stretch performed 10 reps, verbal cues for form, nerve glide for composite extension  stretch on wall with therapist demo and verbal instruction.  Added BTE for strengthening this date CPM for wrist/flexion extension for 200 sec Large knob turning each direction 80 sec each way D handle for supination/pronation for 80 sec each way Large screwdriver for wrist flexion/ext for 80 sec each way  Kinesiotape applied over adhesion - in 3  stars pattern, pt to do at home during day                     OT Education - 10/21/17 1023    Education provided  Yes    Education Details  home exercises adjustments to weights    Person(s) Educated  Patient    Methods  Demonstration;Explanation    Comprehension  Returned demonstration;Verbalized understanding       OT Short Term Goals - 10/12/17 1105      OT SHORT TERM GOAL #1   Title  Pt to be ind in HEP in use of splint , PROM for digits flexion and AROM initiate for digits and wrist  flexion and extention without pain     Baseline  Did not do any ROM up to now - out of cast last week     Time  2    Period  Weeks    Status  On-going      OT SHORT TERM GOAL #2   Title  Pt digits flexion improve PROM to touch palm and AROM to 3 cm object out of palm     Baseline  initated this date PROM and AROM for flexion of digits - 5 wks s/p    Time  3    Period  Weeks    Status  On-going      OT SHORT TERM GOAL #3   Title  R wrist AROM improve to same as prior to surgery to be able to initiate strengthening      Baseline  NO PROM per protocol for wrist - out of cast last week - did not do any ROM     Time  3    Period  Weeks    Status  On-going        OT Long Term Goals - 10/12/17 1105      OT LONG TERM GOAL #1   Title  R digits flexion improve for pt to hold 1 cm object in palm -and 4th and 5th digit PIP more than 70 degrees     Baseline  out of cast last week - did not do any AROM or PROM yet     Time  6    Period  Weeks    Status  On-going      OT LONG TERM GOAL #2   Title  Grip strength increase in R hand to more than 10 lbs to carry and use hand in driving     Baseline  no strengthening yet - out of cast last week - 5 wks s/p     Period  Weeks    Status  On-going      OT LONG TERM GOAL #3   Title  Wrist strength increase for pt to do more than 2 lbs for wrist to prepare to carry and do yard work     Baseline  no ROM yet- out of cast last week - 5 wlks s/p    Time  7    Period  Weeks    Status  On-going            Plan - 10/21/17 0926    Clinical Impression Statement  Patient still used 1# for wrist flexion/ext at home, recommended he do one set with 1#, then 1-2 sets with 2#, for UD/RD move from 3 to 4#, patient to continue with wall stretches, glides and fingertip exercise.  Scar continue to improve with less tension along scar line and decreased pain/sensitivity.  Added BTE this date for strengthening with screwdriver, large knob and D handle.  continue to work towards improving ROM and strengthening to improve functional hand use for necessary daily tasks.     Occupational performance deficits (Please refer to evaluation for details):  ADL's;IADL's;Play;Leisure;Work    Neurosurgeon  Current Impairments/barriers affecting progress:  had reattachment of hand last year , and now FDS repair with tenolysis     OT Frequency  2x / week    OT Duration  6 weeks    OT Treatment/Interventions  Self-care/ADL training;Fluidtherapy;Splinting;Therapeutic exercise;Passive range  of motion;Manual Therapy;Scar mobilization;Patient/family education    Consulted and Agree with Plan of Care  Patient       Patient will benefit from skilled therapeutic intervention in order to improve the following deficits and impairments:  Decreased range of motion, Impaired flexibility, Decreased coordination, Decreased safety awareness, Increased edema, Impaired sensation, Decreased skin integrity, Decreased knowledge of precautions, Decreased knowledge of use of DME, Decreased scar mobility, Impaired UE functional use, Pain, Decreased strength  Visit Diagnosis: Stiffness of right hand, not elsewhere classified  Stiffness of right wrist, not elsewhere classified  Muscle weakness (generalized)  Other disturbances of skin sensation  Pain in right arm  Scar condition and fibrosis of skin    Problem List There are no active problems to display for this patient.  Shelbia Scinto T Tomasita Morrow, OTR/L, CLT  Mariane Burpee 10/21/2017, 10:30 AM  [facility] [address] [place], [address] Phone: [phone]   Fax:  [phone]  Name: ALFONZIA WOOLUM MRN: [id number] Date of Birth: 1975-12-02

## 2017-10-25 ENCOUNTER — Encounter: Payer: Self-pay | Admitting: Occupational Therapy

## 2017-10-25 ENCOUNTER — Ambulatory Visit: Payer: 59 | Admitting: Occupational Therapy

## 2017-10-25 DIAGNOSIS — L905 Scar conditions and fibrosis of skin: Secondary | ICD-10-CM

## 2017-10-25 DIAGNOSIS — M25631 Stiffness of right wrist, not elsewhere classified: Secondary | ICD-10-CM | POA: Diagnosis not present

## 2017-10-25 DIAGNOSIS — M6281 Muscle weakness (generalized): Secondary | ICD-10-CM | POA: Diagnosis not present

## 2017-10-25 DIAGNOSIS — R208 Other disturbances of skin sensation: Secondary | ICD-10-CM

## 2017-10-25 DIAGNOSIS — M25641 Stiffness of right hand, not elsewhere classified: Secondary | ICD-10-CM | POA: Diagnosis not present

## 2017-10-25 DIAGNOSIS — M79601 Pain in right arm: Secondary | ICD-10-CM | POA: Diagnosis not present

## 2017-10-26 NOTE — Therapy (Signed)
[facility] [address], [place], [address] Phone: [phone]   Fax:  [phone]  Occupational Therapy Treatment  Patient Details  Name: Francisco Johns MRN: [id number] Date of Birth: 07-25-1975 Referring Provider: Serafina Royals   Encounter Date: 10/25/2017  OT End of Session - 10/25/17 0943    Visit Number  14    Number of Visits  18    Date for OT Re-Evaluation  11/05/17    OT Start Time  0830    OT Stop Time  0916    OT Time Calculation (min)  46 min    Activity Tolerance  Patient tolerated treatment well    Behavior During Therapy  [facility] for tasks assessed/performed       Past Medical History:  Diagnosis Date  . Metatarsal fracture    right big toe    Past Surgical History:  Procedure Laterality Date  . ankle surgery    . ORIF TOE FRACTURE Right 12/09/2015   Procedure: OPEN REDUCTION INTERNAL FIXATION (ORIF) METATARSAL (TOE) FRACTURE, first metatarsal;  Surgeon: Renette Butters, MD;  Location: [facility];  Service: Orthopedics;  Laterality: Right;  . 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    Right hand fingers wrapped in coban into flexion and moist heat applied for 5 minutes prior to exercises. Patient seen for scar mobilizations to right wrist and forearm, manual soft tissue mob techniques from therapist with techniques to address adhesions, oppositedirections as well as graston brushing with tool nr 2to volar surface of the forearm.  Performed PROM composite flexion of digits to palm Continued attempts with place and hold AROM limited by scar adhesion and pull3rd Thru 5th into extension, continues to improve  Performed Pushing into wall for digits extension with arching of palm - and some flexion in digits including 5th,  Prayer stretch performed 10 reps, verbal cues for form, nerve  glide for composite extension stretch on wall with therapist demo and verbal instruction. BTE for strengthening: Tool 601 for supination and pronation, 4# for 60 sec each, 2 sets each.  Tool 302 large knob 3#, 60 sec each way, 2 sets each. Tool 504 large screwdriver 2 sets 60 secs with 3#   Review of HEP, patient to increase to 2# for wrist flexion/ext for at least one to two sets, first set with 1#. 3# with other exercises, continue with functional hand exercises  Kinesiotape applied over adhesion - in3starspattern, pt to do at home during day                       OT Education - 10/26/17 1020    Education provided  Yes    Education Details  HEP    Person(s) Educated  Patient    Methods  Demonstration;Explanation    Comprehension  Returned demonstration;Verbalized understanding       OT Short Term Goals - 10/12/17 1105      OT SHORT TERM GOAL #1   Title  Pt to be ind in HEP in use of splint , PROM for digits flexion and AROM initiate for digits and wrist  flexion and extention without pain     Baseline  Did not do any ROM up to now - out of cast last week     Time  2    Period  Weeks    Status  On-going      OT SHORT TERM GOAL #2   Title  Pt digits flexion improve PROM to touch palm and AROM to 3 cm object out of palm     Baseline  initated this date PROM and AROM for flexion of digits - 5 wks s/p    Time  3    Period  Weeks    Status  On-going      OT SHORT TERM GOAL #3   Title  R wrist AROM improve to same as prior to surgery to be able to initiate strengthening     Baseline  NO PROM per protocol for wrist - out of cast last week - did not do any ROM     Time  3    Period  Weeks    Status  On-going        OT Long Term Goals - 10/12/17 1105      OT LONG TERM GOAL #1   Title  R digits flexion improve for pt to hold 1 cm object in palm -and 4th and 5th digit PIP more than 70 degrees     Baseline  out of cast last week - did not do any AROM  or PROM yet     Time  6    Period  Weeks    Status  On-going      OT LONG TERM GOAL #2   Title  Grip strength increase in R hand to more than 10 lbs to carry and use hand in driving     Baseline  no strengthening yet - out of cast last week - 5 wks s/p     Period  Weeks    Status  On-going      OT LONG TERM GOAL #3   Title  Wrist strength increase for pt to do more than 2 lbs for wrist to prepare to carry and do yard work     Baseline  no ROM yet- out of cast last week - 5 wlks s/p    Time  7    Period  Weeks    Status  On-going            Plan - 10/25/17 0943    Clinical Impression Statement  Patient continues to make progress in all areas, scar looser and decreased tension along scar line.  Initiated strengthening with BTE this week and patient moving up in force.  Patient continues to do exercises at home on a daily basis, scar massage, functional hand tasks and use of kinesiotape when performing tasks during the day.  Continue to work towards goals.      Occupational performance deficits (Please refer to evaluation for details):  ADL's;IADL's;Play;Leisure;Work    Neurosurgeon    Current Impairments/barriers affecting progress:  had reattachment of hand last year , and now FDS repair with tenolysis     OT Frequency  2x / week    OT Duration  6 weeks    OT Treatment/Interventions  Self-care/ADL training;Fluidtherapy;Splinting;Therapeutic exercise;Passive range of motion;Manual Therapy;Scar mobilization;Patient/family education    Consulted and Agree with Plan of Care  Patient       Patient will benefit from skilled therapeutic intervention in order to improve the following deficits and impairments:  Decreased range of motion, Impaired flexibility, Decreased coordination, Decreased safety awareness, Increased edema, Impaired sensation, Decreased skin integrity, Decreased knowledge of precautions, Decreased knowledge of use of DME, Decreased  scar mobility, Impaired UE  functional use, Pain, Decreased strength  Visit Diagnosis: Stiffness of right hand, not elsewhere classified  Stiffness of right wrist, not elsewhere classified  Muscle weakness (generalized)  Other disturbances of skin sensation  Pain in right arm  Scar condition and fibrosis of skin    Problem List There are no active problems to display for this patient.  Maebry Obrien T Tomasita [facility], OTR/L, CLT  Areg Bialas 10/26/2017, 10:25 AM  [facility] [address] [place], [address] Phone: [phone]   Fax:  [phone]  Name: Francisco Johns MRN: [id number] Date of Birth: 1976-01-21

## 2017-10-29 ENCOUNTER — Ambulatory Visit: Payer: 59 | Admitting: Occupational Therapy

## 2017-10-29 DIAGNOSIS — L905 Scar conditions and fibrosis of skin: Secondary | ICD-10-CM

## 2017-10-29 DIAGNOSIS — M6281 Muscle weakness (generalized): Secondary | ICD-10-CM

## 2017-10-29 DIAGNOSIS — M79601 Pain in right arm: Secondary | ICD-10-CM | POA: Diagnosis not present

## 2017-10-29 DIAGNOSIS — R208 Other disturbances of skin sensation: Secondary | ICD-10-CM

## 2017-10-29 DIAGNOSIS — M25631 Stiffness of right wrist, not elsewhere classified: Secondary | ICD-10-CM | POA: Diagnosis not present

## 2017-10-29 DIAGNOSIS — M25641 Stiffness of right hand, not elsewhere classified: Secondary | ICD-10-CM

## 2017-10-31 ENCOUNTER — Encounter: Payer: Self-pay | Admitting: Occupational Therapy

## 2017-10-31 NOTE — Therapy (Signed)
[facility] [address] [place], [address] Phone: [phone]   Fax:  [phone]  Occupational Therapy Treatment  Patient Details  Name: HAMILTON MARINELLO MRN: [id number] Date of Birth: 1975-07-04 Referring Provider: Serafina Royals   Encounter Date: 10/29/2017  OT End of Session - 10/31/17 2129    Visit Number  15    Number of Visits  18    Date for OT Re-Evaluation  11/05/17    OT Start Time  0932    OT Stop Time  1015    OT Time Calculation (min)  43 min    Activity Tolerance  Patient tolerated treatment well    Behavior During Therapy  [facility] for tasks assessed/performed       Past Medical History:  Diagnosis Date  . Metatarsal fracture    right big toe    Past Surgical History:  Procedure Laterality Date  . ankle surgery    . ORIF TOE FRACTURE Right 12/09/2015   Procedure: OPEN REDUCTION INTERNAL FIXATION (ORIF) METATARSAL (TOE) FRACTURE, first metatarsal;  Surgeon: Renette Butters, MD;  Location: [facility];  Service: Orthopedics;  Laterality: Right;  . 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  Subjective Assessment - 10/31/17 2127    Subjective   Patient reports he is doing well, continues to work on exercises at home on a daily basis, also focusing on functional hand skills/manipulation of objects    Patient Stated Goals  I want to be able to get my movement in my R hand back and be able to bend the pinkie - I am R hand dominant     Currently in Pain?  No/denies    Pain Score  0-No pain       Right hand fingers wrapped in coban into flexion and moist heat applied for 5 minutes prior to exercises. Patient seen for scar mobilizations to right wrist and forearm, manual soft tissue mob techniques from therapist with techniques to address adhesions, oppositedirections as well as graston brushing with  tool nr 2to volar surface of the forearm.  Performed PROM composite flexion of digits to palm Continued attempts with place and hold AROM limited by scar adhesion and pull3rd Thru 5th into extension, continues to improve  Performed Pushing into wall for digits extension with arching of palm - and some flexion in digits including 5th Prayer stretch performed 10 reps, verbal cues for form, nerve glide for composite extension stretch on wall with therapist demo and verbal instruction. BTE for strengthening: Tool 601 for supination and pronation, 4# for 80 sec each, 2 sets each. Tool 302 large knob 3#, 80 sec each way, 2 sets each. Tool 504 large screwdriver 2 sets 60 secs with 3#  Review of HEP, patient to perform wrist flexion/extension 1# for 2 sets, 15 reps, 2# for one set of 10-12 reps 3# with other exercises, continue with functional hand exercises with golf balls, glass beads, cubes to focus on manipulation and prehension patterns. Ulnar side of the hand flexion to hold small objects with ring and small finger, small foam   Kinesiotape applied over adhesion - in3starspattern, pt to do at home during day                      OT Education - 10/31/17 2129    Education  provided  Yes    Education Details  HEP    Person(s) Educated  Patient    Methods  Demonstration;Explanation    Comprehension  Returned demonstration;Verbalized understanding       OT Short Term Goals - 10/31/17 2130      OT SHORT TERM GOAL #1   Title  Pt to be ind in HEP in use of splint , PROM for digits flexion and AROM initiate for digits and wrist  flexion and extention without pain     Baseline  Did not do any ROM up to now - out of cast last week     Time  2    Period  Weeks    Status  On-going      OT SHORT TERM GOAL #2   Title  Pt digits flexion improve PROM to touch palm and AROM to 3 cm object out of palm     Baseline  initated this date PROM and AROM for flexion of  digits - 5 wks s/p    Time  3    Period  Weeks    Status  On-going      OT SHORT TERM GOAL #3   Title  R wrist AROM improve to same as prior to surgery to be able to initiate strengthening     Baseline  NO PROM per protocol for wrist - out of cast last week - did not do any ROM     Time  3    Period  Weeks    Status  On-going        OT Long Term Goals - 10/31/17 2131      OT LONG TERM GOAL #1   Title  R digits flexion improve for pt to hold 1 cm object in palm -and 4th and 5th digit PIP more than 70 degrees     Baseline  out of cast last week - did not do any AROM or PROM yet     Time  6    Period  Weeks    Status  On-going      OT LONG TERM GOAL #2   Title  Grip strength increase in R hand to more than 10 lbs to carry and use hand in driving     Baseline  no strengthening yet - out of cast last week - 5 wks s/p     Period  Weeks    Status  On-going      OT LONG TERM GOAL #3   Title  Wrist strength increase for pt to do more than 2 lbs for wrist to prepare to carry and do yard work     Baseline  no ROM yet- out of cast last week - 5 wlks s/p    Time  7    Period  Weeks    Status  On-going            Plan - 10/31/17 2130    Clinical Impression Statement  Patient has made good progress over the last 3-4 weeks with management of scar with increased mobility of scar, decreased tension along scarline.  He is consistent with home exercises and has been focusing on quality of movement and increased range of motion along with strengthening.  Initiated BTE for strengthening this last week and has done well and able to progress with resistance.  He is able to demonstrate understanding of functional hand exercises and is performing at home with occasional modifications and advancements  in complexity of tasks.  Continue to work towards goals to increase independence in daily tasks.     Occupational performance deficits (Please refer to evaluation for details):   ADL's;IADL's;Play;Leisure;Work    Neurosurgeon    Current Impairments/barriers affecting progress:  had reattachment of hand last year , and now FDS repair with tenolysis     OT Frequency  2x / week    OT Duration  6 weeks    OT Treatment/Interventions  Self-care/ADL training;Fluidtherapy;Splinting;Therapeutic exercise;Passive range of motion;Manual Therapy;Scar mobilization;Patient/family education    Consulted and Agree with Plan of Care  Patient       Patient will benefit from skilled therapeutic intervention in order to improve the following deficits and impairments:  Decreased range of motion, Impaired flexibility, Decreased coordination, Decreased safety awareness, Increased edema, Impaired sensation, Decreased skin integrity, Decreased knowledge of precautions, Decreased knowledge of use of DME, Decreased scar mobility, Impaired UE functional use, Pain, Decreased strength  Visit Diagnosis: Stiffness of right hand, not elsewhere classified  Stiffness of right wrist, not elsewhere classified  Muscle weakness (generalized)  Other disturbances of skin sensation  Pain in right arm  Scar condition and fibrosis of skin    Problem List There are no active problems to display for this patient.  Olita Takeshita Oneita Jolly, OTR/L, CLT  Ladarren Steiner 10/31/2017, 9:40 PM  [facility] [address] [place], [address] Phone: [phone]   Fax:  [phone]  Name: AMARIS GARRETTE MRN: [id number] Date of Birth: 22-Mar-1976

## 2017-11-01 ENCOUNTER — Ambulatory Visit: Payer: 59 | Admitting: Occupational Therapy

## 2017-11-01 DIAGNOSIS — M25641 Stiffness of right hand, not elsewhere classified: Secondary | ICD-10-CM

## 2017-11-01 DIAGNOSIS — M6281 Muscle weakness (generalized): Secondary | ICD-10-CM | POA: Diagnosis not present

## 2017-11-01 DIAGNOSIS — R208 Other disturbances of skin sensation: Secondary | ICD-10-CM | POA: Diagnosis not present

## 2017-11-01 DIAGNOSIS — M25631 Stiffness of right wrist, not elsewhere classified: Secondary | ICD-10-CM | POA: Diagnosis not present

## 2017-11-01 DIAGNOSIS — L905 Scar conditions and fibrosis of skin: Secondary | ICD-10-CM | POA: Diagnosis not present

## 2017-11-01 DIAGNOSIS — M79601 Pain in right arm: Secondary | ICD-10-CM | POA: Diagnosis not present

## 2017-11-01 NOTE — Patient Instructions (Signed)
Pt to cont with scar massage and kinesiotape   review do to starts  But more proximal at adhesions  Putty increase to teal putty - gripping , lat and 3 point grip  2 x 12 reps  And increase to 3 sets   Wrist extention   increase to 2 lbs    But sideways without gravity - 10 reps  2 sets increase to  Cont 3 lbs for sup /pro , RD, UD   12  Reps   increase to 3 sets   but should have NO PAIN over lat epicodyle  Cont with  Manipulation and [place] act  And composite flexor stretch to hand thru elbow  And extention of digits thru wall -

## 2017-11-01 NOTE — Therapy (Signed)
[facility] [address] [place], [address] Phone: [phone]   Fax:  [phone]  Occupational Therapy Treatment  Patient Details  Name: Francisco Johns MRN: [id number] Date of Birth: 1975/08/30 Referring Provider: Serafina Royals   Encounter Date: 11/01/2017  OT End of Session - 11/01/17 1347    Visit Number  16    Number of Visits  18    Date for OT Re-Evaluation  11/05/17    OT Start Time  0840    OT Stop Time  0935    OT Time Calculation (min)  55 min    Activity Tolerance  Patient tolerated treatment well    Behavior During Therapy  [facility] for tasks assessed/performed       Past Medical History:  Diagnosis Date  . Metatarsal fracture    right big toe    Past Surgical History:  Procedure Laterality Date  . ankle surgery    . ORIF TOE FRACTURE Right 12/09/2015   Procedure: OPEN REDUCTION INTERNAL FIXATION (ORIF) METATARSAL (TOE) FRACTURE, first metatarsal;  Surgeon: Renette Butters, MD;  Location: [facility];  Service: Orthopedics;  Laterality: Right;  . 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  Subjective Assessment - 11/01/17 0845    Subjective   Hand still feels like it is a sleep partially - seen surgeon - everything is just slow = but I can use my hand to drive, hold cup , carrying up to 5 lbs - cut food, - at work supervisor     Patient Stated Goals  I want to be able to get my movement in my R hand back and be able to bend the pinkie - I am R hand dominant          [facility] OT Assessment - 11/01/17 0001      AROM   Right Wrist Extension  55 Degrees    Right Wrist Flexion  90 Degrees      Strength   Right Hand Grip (lbs)  20    Right Hand Lateral Pinch  9 lbs      Right Hand AROM   R Ring  MCP 0-90  75 Degrees    R Ring PIP 0-100  100 Degrees    R Little  MCP 0-90  70 Degrees    R  Little PIP 0-100  85 Degrees          Pt to cont with scar massage and kinesiotape   review do to starts  But more proximal at adhesions Scar mobs and massage done with Graston tool nr 2 for brushing , and extrator use with making fist for mobs   Assess grip and AROM for wrist and digits   Putty increase to teal putty - gripping , lat and 3 point grip  2 x 12 reps  And increase to 3 sets   Wrist extention   increase to 2 lbs    But sideways without gravity - 10 reps  2 sets increase to  Cont 3 lbs for sup /pro , RD, UD   12  Reps   increase to 3 sets   but should have NO PAIN over lat epicodyle  Cont with  Manipulation and FMC act  And composite flexor stretch to hand thru elbow  And extention of digits thru wall -  BTE done for gripping 35 lbs 120 sec - place and hold  Large knob for RD, UD   for wrist and then doing digits - after few trials was able to activate 4th and 5th digit flexion - with not using thumb during gripping- on 3 trial of 100 sec               OT Education - 11/01/17 1347    Education provided  Yes    Education Details  progress and hEP upgrade    Person(s) Educated  Patient    Methods  Explanation;Demonstration    Comprehension  Verbalized understanding;Returned demonstration;Verbal cues required    Comprehension  Verbalized understanding;Returned demonstration       OT Short Term Goals - 10/31/17 2130      OT SHORT TERM GOAL #1   Title  Pt to be ind in HEP in use of splint , PROM for digits flexion and AROM initiate for digits and wrist  flexion and extention without pain     Baseline  Did not do any ROM up to now - out of cast last week     Time  2    Period  Weeks    Status  On-going      OT SHORT TERM GOAL #2   Title  Pt digits flexion improve PROM to touch palm and AROM to 3 cm object out of palm     Baseline  initated this date PROM and AROM for flexion of digits - 5 wks s/p    Time  3    Period  Weeks    Status   On-going      OT SHORT TERM GOAL #3   Title  R wrist AROM improve to same as prior to surgery to be able to initiate strengthening     Baseline  NO PROM per protocol for wrist - out of cast last week - did not do any ROM     Time  3    Period  Weeks    Status  On-going        OT Long Term Goals - 10/31/17 2131      OT LONG TERM GOAL #1   Title  R digits flexion improve for pt to hold 1 cm object in palm -and 4th and 5th digit PIP more than 70 degrees     Baseline  out of cast last week - did not do any AROM or PROM yet     Time  6    Period  Weeks    Status  On-going      OT LONG TERM GOAL #2   Title  Grip strength increase in R hand to more than 10 lbs to carry and use hand in driving     Baseline  no strengthening yet - out of cast last week - 5 wks s/p     Period  Weeks    Status  On-going      OT LONG TERM GOAL #3   Title  Wrist strength increase for pt to do more than 2 lbs for wrist to prepare to carry and do yard work     Baseline  no ROM yet- out of cast last week - 5 wlks s/p    Time  7    Period  Weeks    Status  On-going            Plan - 11/01/17 1348    Clinical  Impression Statement  Pt able to show increase grip strength compare to prior to surgery 11 wks ago - show increase 4th and 5th digit flexion - and report increase use of R hand grip - able to increase pt's HEP this date  and pt to cont to increase strength ,  and flexion and use of 4th and 5th digit during ADL's and IADl;s     Occupational performance deficits (Please refer to evaluation for details):  ADL's;IADL's;Play;Leisure;Work    Neurosurgeon    Current Impairments/barriers affecting progress:  had reattachment of hand last year , and now FDS repair with tenolysis     OT Frequency  2x / week    OT Duration  4 weeks    OT Treatment/Interventions  Self-care/ADL training;Fluidtherapy;Splinting;Therapeutic exercise;Passive range of motion;Manual Therapy;Scar mobilization;Patient/family  education    Plan  assess progress and upgrade per protocol    Clinical Decision Making  Multiple treatment options, significant modification of task necessary    OT Home Exercise Plan  see pt instruction    Consulted and Agree with Plan of Care  Patient       Patient will benefit from skilled therapeutic intervention in order to improve the following deficits and impairments:  Decreased range of motion, Impaired flexibility, Decreased coordination, Decreased safety awareness, Increased edema, Impaired sensation, Decreased skin integrity, Decreased knowledge of precautions, Decreased knowledge of use of DME, Decreased scar mobility, Impaired UE functional use, Pain, Decreased strength  Visit Diagnosis: Stiffness of right hand, not elsewhere classified  Stiffness of right wrist, not elsewhere classified  Muscle weakness (generalized)  Scar condition and fibrosis of skin    Problem List There are no active problems to display for this patient.   Rosalyn Gess OTR/L,CLT 11/01/2017, 1:51 PM  [facility] [address], [place], [address] Phone: [phone]   Fax:  [phone]  Name: Francisco Johns MRN: [id number] Date of Birth: December 15, 1975

## 2017-11-04 DIAGNOSIS — Z08 Encounter for follow-up examination after completed treatment for malignant neoplasm: Secondary | ICD-10-CM | POA: Diagnosis not present

## 2017-11-04 DIAGNOSIS — Z85820 Personal history of malignant melanoma of skin: Secondary | ICD-10-CM | POA: Diagnosis not present

## 2017-11-04 DIAGNOSIS — D225 Melanocytic nevi of trunk: Secondary | ICD-10-CM | POA: Diagnosis not present

## 2017-11-04 DIAGNOSIS — Z1283 Encounter for screening for malignant neoplasm of skin: Secondary | ICD-10-CM | POA: Diagnosis not present

## 2017-11-08 ENCOUNTER — Ambulatory Visit: Payer: 59 | Admitting: Occupational Therapy

## 2017-11-08 DIAGNOSIS — M25631 Stiffness of right wrist, not elsewhere classified: Secondary | ICD-10-CM

## 2017-11-08 DIAGNOSIS — L905 Scar conditions and fibrosis of skin: Secondary | ICD-10-CM | POA: Diagnosis not present

## 2017-11-08 DIAGNOSIS — R208 Other disturbances of skin sensation: Secondary | ICD-10-CM | POA: Diagnosis not present

## 2017-11-08 DIAGNOSIS — M79601 Pain in right arm: Secondary | ICD-10-CM | POA: Diagnosis not present

## 2017-11-08 DIAGNOSIS — M25641 Stiffness of right hand, not elsewhere classified: Secondary | ICD-10-CM | POA: Diagnosis not present

## 2017-11-08 DIAGNOSIS — M6281 Muscle weakness (generalized): Secondary | ICD-10-CM | POA: Diagnosis not present

## 2017-11-08 NOTE — Patient Instructions (Signed)
Cont with same but add RTB for scapula retraction , shoulder extention  15 reps   ext rotation Elbow extention  10 reps   3 x wk  Can increase to 2 sets in 3 days if no increase pain  Cont with [facility] Work on opposion /ADD of digits like sliding digits thru ducktape roll  Putty to cont gripping - teal  But add digi gripper 1.5 lbs - place and hold on 5th  10 reps  and no sustained grip

## 2017-11-08 NOTE — Therapy (Signed)
[facility] [address] [place], [address] Phone: [phone]   Fax:  [phone]  Occupational Therapy Treatment  Patient Details  Name: Francisco Johns MRN: [id number] Date of Birth: 1976/04/20 Referring Provider: Serafina Royals   Encounter Date: 11/08/2017  OT End of Session - 11/08/17 1213    Visit Number  17    Number of Visits  23    Date for OT Re-Evaluation  12/20/17    OT Start Time  0936    OT Stop Time  1030    OT Time Calculation (min)  54 min    Activity Tolerance  Patient tolerated treatment well    Behavior During Therapy  [facility] for tasks assessed/performed       Past Medical History:  Diagnosis Date  . Metatarsal fracture    right big toe    Past Surgical History:  Procedure Laterality Date  . ankle surgery    . ORIF TOE FRACTURE Right 12/09/2015   Procedure: OPEN REDUCTION INTERNAL FIXATION (ORIF) METATARSAL (TOE) FRACTURE, first metatarsal;  Surgeon: Renette Butters, MD;  Location: [facility];  Service: Orthopedics;  Laterality: Right;  . 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  Subjective Assessment - 11/08/17 0937    Subjective   Done my exercises - up to 4 lbs wrist ext and sup - but could not do RD, UD - fisting about the same- numbness and pins and needles about same - can do gallon of milk in my hand if low     Patient Stated Goals  I want to be able to get my movement in my R hand back and be able to bend the pinkie - I am R hand dominant     Currently in Pain?  No/denies         [facility] OT Assessment - 11/08/17 0001      Strength   Right Hand Grip (lbs)  25    Right Hand Lateral Pinch  9 lbs      Right Hand AROM   R Little  MCP 0-90  70 Degrees    R Little PIP 0-100  90 Degrees      assess grip and AROM to 4th and 5th digit  progress 5 lbs in week - with teal putty      Cont with same HEP  This date assess R UE strength  4//5  But empty can test some what week and pain 1-2/10 per pt    add RTB for scapula retraction , shoulder extention  15 reps   ext rotation Elbow extention  10 reps   3 x wk  Can increase to 2 sets in 3 days if no increase pain   Cont with [facility] Assess golf ball opposition to 2and and 3rd -  Taped thumb IP in extention and base of thumb into PA - done it with and without it  Pick up 2 cm object 3 point but unable to do 1 cm  2 point 1 cm object able to do   Work on opposion /ADD of digits like sliding digits thru ducktape roll  Putty to cont gripping - teal  But add digi gripper 1.5 lbs - place and hold on 5th  10 reps  and no sustained grip  OT Treatments/Exercises (OP) - 11/08/17 0001      Moist Heat Therapy   Number Minutes Moist Heat  8 Minutes    Moist Heat Location  Hand;Wrist prior to scar mobs and PROM for digits 5th and 4th         Pt to cont with scar massage and kinesiotape   review do to starts  But more proximal at adhesions Scar mobs and massage done with Graston tool nr 2 for brushing , and extrator use with making fist for mobs         OT Education - 11/08/17 1210    Education provided  Yes    Education Details  changes to HEP-    Person(s) Educated  Patient    Methods  Explanation;Demonstration;Handout    Comprehension  Returned demonstration       OT Short Term Goals - 11/08/17 1349      OT SHORT TERM GOAL #1   Title  Pt to be ind in HEP in use of splint , PROM for digits flexion and AROM initiate for digits and wrist  flexion and extention without pain     Status  Achieved      OT SHORT TERM GOAL #2   Title  Pt digits flexion improve PROM to touch palm and AROM to 3 cm object out of palm     Status  Achieved      OT SHORT TERM GOAL #3   Title  R wrist AROM improve to same as prior to surgery to be able to initiate strengthening     Status  Achieved      OT  SHORT TERM GOAL #4   Title  Sensation improve in R hand to identify  ojbects in palm 50% of time     Baseline  3.61 this date except DIP of 3rd thru 5th     Time  6    Status  On-going    Target Date  12/20/17        OT Long Term Goals - 11/08/17 1350      OT LONG TERM GOAL #1   Title  R digits flexion improve for pt to hold 1 cm object in palm -and 4th and 5th digit PIP more than 70 degrees     Baseline  70 degrees of PIP flexion , MC flexion pip - depend how he intiate grip with MC flexion or PIP flexion     Time  6    Period  Weeks    Status  On-going    Target Date  12/20/17      OT LONG TERM GOAL #2   Title  Grip strength increase in R hand to more than 10 lbs to carry and use hand in driving     Baseline  Grip R 25 lbs this date - but pick up 8 lbs     Time  6    Status  On-going    Target Date  12/20/17      OT LONG TERM GOAL #3   Title  Wrist strength increase for pt to do more than 2 lbs for wrist to prepare to carry and do yard work     Status  Achieved      OT LONG TERM GOAL #4   Title  Pt to use 5 lbs at wrist and elbow without pain or discomfort to use in ADL's and IADL's     Baseline  use 3-4 lbs -  and RTB this date - still need to be carefull that tennis elbow symptoms do not happen    Time  6    Period  Weeks    Status  On-going    Target Date  12/20/17            Plan - 11/08/17 1346    Clinical Impression Statement  Pt this date increase by 5 lbs grip in R hand compare to last week - appear to thumb opposition better with golf ball opposition to 2nd an 3rd - and 3 point grip to 2 cm object - not yet 1 cm object but can do 2 point to 2nd - pt to work on opposition or  ADD of digits to get fingers thru  8-10 cm tube - also  added this date RTB for L arm strength - pt did show some discomfort with empty  can test for shoudler - add for conditioning - keep wrist  weight at 3-4 lbs - and add digi gripper for place and hold of 5th  - in combination with  putty     Occupational performance deficits (Please refer to evaluation for details):  ADL's;IADL's;Play;Leisure;Work    Neurosurgeon    Current Impairments/barriers affecting progress:  had reattachment of hand last year , and now FDS repair with tenolysis     OT Frequency  1x / week    OT Duration  6 weeks    OT Treatment/Interventions  Self-care/ADL training;Fluidtherapy;Splinting;Therapeutic exercise;Passive range of motion;Manual Therapy;Scar mobilization;Patient/family education    Plan  assess progress and upgrade per protocol    Clinical Decision Making  Multiple treatment options, significant modification of task necessary    OT Home Exercise Plan  see pt instruction    Consulted and Agree with Plan of Care  Patient       Patient will benefit from skilled therapeutic intervention in order to improve the following deficits and impairments:  Decreased range of motion, Impaired flexibility, Decreased coordination, Decreased safety awareness, Increased edema, Impaired sensation, Decreased skin integrity, Decreased knowledge of precautions, Decreased knowledge of use of DME, Decreased scar mobility, Impaired UE functional use, Pain, Decreased strength  Visit Diagnosis: Stiffness of right hand, not elsewhere classified - Plan: Ot plan of care cert/re-cert  Stiffness of right wrist, not elsewhere classified - Plan: Ot plan of care cert/re-cert  Muscle weakness (generalized) - Plan: Ot plan of care cert/re-cert  Scar condition and fibrosis of skin - Plan: Ot plan of care cert/re-cert  Other disturbances of skin sensation - Plan: Ot plan of care cert/re-cert  Pain in right arm - Plan: Ot plan of care cert/re-cert    Problem List There are no active problems to display for this patient.   Rosalyn Gess OTR/L,CLT 11/08/2017, 1:57 PM  [facility] [address] [place], [address] Phone: [phone]    Fax:  [phone]  Name: Francisco Johns MRN: [id number] Date of Birth: 05/20/1975

## 2017-11-14 ENCOUNTER — Ambulatory Visit: Payer: 59 | Admitting: Occupational Therapy

## 2017-11-14 DIAGNOSIS — R208 Other disturbances of skin sensation: Secondary | ICD-10-CM | POA: Diagnosis not present

## 2017-11-14 DIAGNOSIS — L905 Scar conditions and fibrosis of skin: Secondary | ICD-10-CM | POA: Diagnosis not present

## 2017-11-14 DIAGNOSIS — M6281 Muscle weakness (generalized): Secondary | ICD-10-CM

## 2017-11-14 DIAGNOSIS — M25631 Stiffness of right wrist, not elsewhere classified: Secondary | ICD-10-CM

## 2017-11-14 DIAGNOSIS — M79601 Pain in right arm: Secondary | ICD-10-CM | POA: Diagnosis not present

## 2017-11-14 DIAGNOSIS — M25641 Stiffness of right hand, not elsewhere classified: Secondary | ICD-10-CM

## 2017-11-14 NOTE — Therapy (Signed)
[facility] [address] [place], [address] Phone: [phone]   Fax:  [phone]  Occupational Therapy Treatment  Patient Details  Name: Francisco Johns MRN: [id number] Date of Birth: [birth year] Referring Provider: Serafina Royals   Encounter Date: 11/14/2017  OT End of Session - 11/14/17 1401    Visit Number  18    Number of Visits  23    Date for OT Re-Evaluation  12/20/17    OT Start Time  1300    OT Stop Time  1354    OT Time Calculation (min)  54 min    Activity Tolerance  Patient tolerated treatment well    Behavior During Therapy  [facility] for tasks assessed/performed       Past Medical History:  Diagnosis Date  . Metatarsal fracture    right big toe    Past Surgical History:  Procedure Laterality Date  . ankle surgery    . ORIF TOE FRACTURE Right 12/09/2015   Procedure: OPEN REDUCTION INTERNAL FIXATION (ORIF) METATARSAL (TOE) FRACTURE, first metatarsal;  Surgeon: Renette Butters, MD;  Location: [facility];  Service: Orthopedics;  Laterality: Right;  . 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  Subjective Assessment - 11/14/17 1348    Subjective   I found myself catching bag of ice the other day and I am starting to gesture     Patient Stated Goals  I want to be able to get my movement in my R hand back and be able to bend the pinkie - I am R hand dominant     Currently in Pain?  No/denies          Patient seen for scar mobilizations to right wrist and forearm, manual soft tissue mob techniques from therapist with techniques to address adhesions - Use coban for scar mobs - manual therapy by OT - oppositesdirections   Performed PROM composite flexion of digits to palm , Followed by Block in palm and AROMMC2-3 sec -100%, Block MC and AROMPIP2-3 sec -100%, 10 reps  Continued attempts  with place and hold AROM limited by scar adhesion and pull3rd Thru 5th into extension, however this continues to improve Hand gripper done at 10 lbs - intrinsic fist 10 reps  And then full fist place and hold   10 reps   increase to 3 sets over few days - no pain   Kinesiotape applied over adhesion - in3starspattern, pt to do at home during day Cont with at home  Thumb PA and RA place and hold with rubber band - Performed Pushing into wall for digits extension with arching of palm - and some flexion in digits including 5th,  Prayer stretch performed 10 reps, verbal cues for form, nerve glide for composite extension stretch on wall with therapist demo and verbal instruction.   Pt can carry 9 lbs without issues -some pull on wrist with 10 lbs   5 lbs weight able to do scapula retraction , tricep, ext rotation 10 reps  Increase to 3 sets in week  And then 3 lbs over head Same reps - low - and now pain  Can tape 4thand 5th into fist to maintain grip on weight                    OT Education - 11/14/17 1401  Education provided  Yes    Education Details  strenghtning with 3-5 lbs weight , hand gripper 10 lbs     Person(s) Educated  Patient    Methods  Explanation;Demonstration    Comprehension  Verbalized understanding;Returned demonstration       OT Short Term Goals - 11/08/17 1349      OT SHORT TERM GOAL #1   Title  Pt to be ind in HEP in use of splint , PROM for digits flexion and AROM initiate for digits and wrist  flexion and extention without pain     Status  Achieved      OT SHORT TERM GOAL #2   Title  Pt digits flexion improve PROM to touch palm and AROM to 3 cm object out of palm     Status  Achieved      OT SHORT TERM GOAL #3   Title  R wrist AROM improve to same as prior to surgery to be able to initiate strengthening     Status  Achieved      OT SHORT TERM GOAL #4   Title  Sensation improve in R hand to identify  ojbects in palm 50%  of time     Baseline  3.61 this date except DIP of 3rd thru 5th     Time  6    Status  On-going    Target Date  12/20/17        OT Long Term Goals - 11/08/17 1350      OT LONG TERM GOAL #1   Title  R digits flexion improve for pt to hold 1 cm object in palm -and 4th and 5th digit PIP more than 70 degrees     Baseline  70 degrees of PIP flexion , MC flexion pip - depend how he intiate grip with MC flexion or PIP flexion     Time  6    Period  Weeks    Status  On-going    Target Date  12/20/17      OT LONG TERM GOAL #2   Title  Grip strength increase in R hand to more than 10 lbs to carry and use hand in driving     Baseline  Grip R 25 lbs this date - but pick up 8 lbs     Time  6    Status  On-going    Target Date  12/20/17      OT LONG TERM GOAL #3   Title  Wrist strength increase for pt to do more than 2 lbs for wrist to prepare to carry and do yard work     Status  Achieved      OT LONG TERM GOAL #4   Title  Pt to use 5 lbs at wrist and elbow without pain or discomfort to use in ADL's and IADL's     Baseline  use 3-4 lbs - and RTB this date - still need to be carefull that tennis elbow symptoms do not happen    Time  6    Period  Weeks    Status  On-going    Target Date  12/20/17            Plan - 11/14/17 1402    Clinical Impression Statement  Pt grip same as last week and manipulation - wrist strength appear stronger  -able to carry 9 lbs in hand wihtout issues - and 3-5 lbs for UE strength - 5 lbs bother  shoulder and  5th and 4th cannot hold on to weight - but wrist can stay midline - pt to do 3 lbs for over head and 5 lbs for scapula - low reps 10-15 and one set and increase gradually over week to 2-3 sets - but pain free     Occupational performance deficits (Please refer to evaluation for details):  ADL's;IADL's;Play;Leisure;Work    Neurosurgeon    Current Impairments/barriers affecting progress:  had reattachment of hand last year , and now FDS  repair with tenolysis     OT Frequency  1x / week    OT Duration  6 weeks    OT Treatment/Interventions  Self-care/ADL training;Fluidtherapy;Splinting;Therapeutic exercise;Passive range of motion;Manual Therapy;Scar mobilization;Patient/family education    Plan  assess progress and upgrade per protocol    Clinical Decision Making  Multiple treatment options, significant modification of task necessary    OT Home Exercise Plan  see pt instruction    Consulted and Agree with Plan of Care  Patient       Patient will benefit from skilled therapeutic intervention in order to improve the following deficits and impairments:  Decreased range of motion, Impaired flexibility, Decreased coordination, Decreased safety awareness, Increased edema, Impaired sensation, Decreased skin integrity, Decreased knowledge of precautions, Decreased knowledge of use of DME, Decreased scar mobility, Impaired UE functional use, Pain, Decreased strength  Visit Diagnosis: Stiffness of right hand, not elsewhere classified  Stiffness of right wrist, not elsewhere classified  Muscle weakness (generalized)  Scar condition and fibrosis of skin  Other disturbances of skin sensation  Pain in right arm    Problem List There are no active problems to display for this patient.   Rosalyn Gess OTR/l,CLT 11/14/2017, 2:04 PM  [facility] [address], [place], [address] Phone: [phone]   Fax:  [phone]  Name: Francisco Johns MRN: [id number] Date of Birth: [birth year]

## 2017-11-14 NOTE — Patient Instructions (Signed)
Same HEP - but use small object for manipulation palm<> fingers <> thumb  Cont scar massage and kinesiotape   Hand gripper setup this date - with 10 lbs  Do PIP - intrinsic  10-15 reps  Ad 2 x day and increase gradually to 3 sets  And then increse to 15 lbs - but over few days  Use 3 lbs - coban 4thand 5th on to weight for over head shoulder exercises  And use 5 lbs for retraction , elbow extention  And ext rotation   10 reps and increase to 15 reps  And then 2 sets over few days - and then 3 sets   but one time day for strengthening

## 2017-11-22 ENCOUNTER — Ambulatory Visit: Payer: 59 | Attending: Plastic Surgery | Admitting: Occupational Therapy

## 2017-11-22 DIAGNOSIS — M6281 Muscle weakness (generalized): Secondary | ICD-10-CM | POA: Diagnosis not present

## 2017-11-22 DIAGNOSIS — M25631 Stiffness of right wrist, not elsewhere classified: Secondary | ICD-10-CM | POA: Insufficient documentation

## 2017-11-22 DIAGNOSIS — L905 Scar conditions and fibrosis of skin: Secondary | ICD-10-CM | POA: Insufficient documentation

## 2017-11-22 DIAGNOSIS — M79601 Pain in right arm: Secondary | ICD-10-CM | POA: Diagnosis not present

## 2017-11-22 DIAGNOSIS — R208 Other disturbances of skin sensation: Secondary | ICD-10-CM | POA: Insufficient documentation

## 2017-11-22 DIAGNOSIS — M25641 Stiffness of right hand, not elsewhere classified: Secondary | ICD-10-CM | POA: Diagnosis not present

## 2017-11-22 NOTE — Patient Instructions (Signed)
t Hand gripper done at 10 lbs - intrinsic fist 10 reps  And then full fist place and hold - pt to work on composite ( [place] flexion)   10 reps   increase to 3 sets over few days - no pain - and increase to 15 lbs next week  Drop to 1 sets - increase sets    Kinesiotape applied over adhesion - in3starspattern, pt to do at home during day Cont with at home  Thumb PA and RA place and hold with rubber band -4+/5 for PA - reinforce again RA - need to do place and hold  Performed Pushing into wall for digits extension with arching of palm - and some flexion in digits including 5th,  Prayer stretch performed 10 reps, verbal cues for form, nerve glide for composite extension stretch on wall with therapist demo and verbal instruction.   Cont with dexterity   5 lbs weight able to do scapula retraction , tricep, ext rotation 10 reps  Increase to 3 sets this week  And sup/pro, RD,UD   increase sets and reps gradually   wrist extention without gravity  With 3 lbs  And then 3 lbs over head cont with  At 2 sets now - increase to 3 sets   but one time day  Can tape 4thand 5th into fist to maintain grip on weight

## 2017-11-22 NOTE — Therapy (Signed)
[facility] [address] [place], [address] Phone: [phone]   Fax:  [phone]  Occupational Therapy Treatment  Patient Details  Name: Francisco Johns MRN: [id number] Date of Birth: July 30, 1975 Referring Provider: Serafina Royals   Encounter Date: 11/22/2017  OT End of Session - 11/22/17 1731    Visit Number  19    Number of Visits  23    Date for OT Re-Evaluation  12/20/17    OT Start Time  1018    OT Stop Time  1115    OT Time Calculation (min)  57 min    Activity Tolerance  Patient tolerated treatment well    Behavior During Therapy  [facility] [place] for tasks assessed/performed       Past Medical History:  Diagnosis Date  . Metatarsal fracture    right big toe    Past Surgical History:  Procedure Laterality Date  . ankle surgery    . ORIF TOE FRACTURE Right 12/09/2015   Procedure: OPEN REDUCTION INTERNAL FIXATION (ORIF) METATARSAL (TOE) FRACTURE, first metatarsal;  Surgeon: Renette Butters, MD;  Location: [facility];  Service: Orthopedics;  Laterality: Right;  . 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  Subjective Assessment - 11/22/17 1126    Subjective   I have been doing the gripper - the tips and full fist place and hold - and then 3 lbs for my shoulder and 5 lbs for elbow  - pinkie still wants to cross over the ring     Patient Stated Goals  I want to be able to get my movement in my R hand back and be able to bend the pinkie - I am R hand dominant     Currently in Pain?  No/denies         [facility] OT Assessment - 11/22/17 0001      Strength   Right Hand Grip (lbs)  27       Patient seen for scar mobilizations to right wrist and forearm, manual soft tissue mob techniques from therapist with techniques to address adhesions - use vibration and xtractor( with ROM )  Performed PROM composite flexion of  digits to palm , Followed by Place and hold  Grip assess - see flowsheet Hand gripper done at 10 lbs - intrinsic fist 10 reps  And then full fist place and hold - pt to work on composite ( MC flexion)   10 reps   increase to 3 sets over few days - no pain - and increase to 15 lbs next week  Drop to 1 sets - increase sets   Kinesiotape applied over adhesion - in3starspattern, pt to do at home during day Cont with at home  Thumb PA and RA place and hold with rubber band -4+/5 for PA - reinforce again RA - need to do place and hold  Performed Pushing into wall for digits extension with arching of palm - and some flexion in digits including 5th,  Prayer stretch performed 10 reps, verbal cues for form, nerve glide for composite extension stretch on wall with therapist demo and verbal instruction.     5 lbs weight able to do scapula retraction , tricep, ext rotation 10 reps  Increase to 3 sets this week  And sup/pro, RD,UD   increase sets and reps gradually   wrist  extention without gravity  With 3 lbs  And then 3 lbs over head cont with  At 2 sets now - increase to 3 sets   but one time day  Can tape 4thand 5th into fist to maintain grip on weight           OT Treatments/Exercises (OP) - 11/22/17 0001      Moist Heat Therapy   Number Minutes Moist Heat  8 Minutes    Moist Heat Location  Hand;Wrist prior to scar massage             OT Education - 11/22/17 1731    Education provided  Yes    Education Details  review and upgrade HEP     Person(s) Educated  Patient    Methods  Demonstration;Explanation    Comprehension  Verbalized understanding;Returned demonstration       OT Short Term Goals - 11/08/17 1349      OT SHORT TERM GOAL #1   Title  Pt to be ind in HEP in use of splint , PROM for digits flexion and AROM initiate for digits and wrist  flexion and extention without pain     Status  Achieved      OT SHORT TERM GOAL #2   Title  Pt digits  flexion improve PROM to touch palm and AROM to 3 cm object out of palm     Status  Achieved      OT SHORT TERM GOAL #3   Title  R wrist AROM improve to same as prior to surgery to be able to initiate strengthening     Status  Achieved      OT SHORT TERM GOAL #4   Title  Sensation improve in R hand to identify  ojbects in palm 50% of time     Baseline  3.61 this date except DIP of 3rd thru 5th     Time  6    Status  On-going    Target Date  12/20/17        OT Long Term Goals - 11/08/17 1350      OT LONG TERM GOAL #1   Title  R digits flexion improve for pt to hold 1 cm object in palm -and 4th and 5th digit PIP more than 70 degrees     Baseline  70 degrees of PIP flexion , MC flexion pip - depend how he intiate grip with MC flexion or PIP flexion     Time  6    Period  Weeks    Status  On-going    Target Date  12/20/17      OT LONG TERM GOAL #2   Title  Grip strength increase in R hand to more than 10 lbs to carry and use hand in driving     Baseline  Grip R 25 lbs this date - but pick up 8 lbs     Time  6    Status  On-going    Target Date  12/20/17      OT LONG TERM GOAL #3   Title  Wrist strength increase for pt to do more than 2 lbs for wrist to prepare to carry and do yard work     Status  Achieved      OT LONG TERM GOAL #4   Title  Pt to use 5 lbs at wrist and elbow without pain or discomfort to use in ADL's and IADL's     Baseline  use 3-4 lbs - and RTB this date - still need to be carefull that tennis elbow symptoms do not happen    Time  6    Period  Weeks    Status  On-going    Target Date  12/20/17            Plan - 11/22/17 1732    Clinical Impression Statement  Pt show increase PIP flexion to 95 , MC decrease still and composite - pt to work on hand gripper composite flexion - flexing MC - and then upgrade HEP with resistance , reps and sets over the next  2 wks - pt show increase  grip strength and grip on weightts     Occupational performance  deficits (Please refer to evaluation for details):  ADL's;IADL's;Play;Leisure;Work    Neurosurgeon    Current Impairments/barriers affecting progress:  had reattachment of hand last year , and now FDS repair with tenolysis     OT Frequency  1x / week    OT Duration  4 weeks    OT Treatment/Interventions  Self-care/ADL training;Fluidtherapy;Splinting;Therapeutic exercise;Passive range of motion;Manual Therapy;Scar mobilization;Patient/family education    Plan  assess progress and upgrade per protocol    Clinical Decision Making  Multiple treatment options, significant modification of task necessary    OT Home Exercise Plan  see pt instruction    Consulted and Agree with Plan of Care  Patient       Patient will benefit from skilled therapeutic intervention in order to improve the following deficits and impairments:  Decreased range of motion, Impaired flexibility, Decreased coordination, Decreased safety awareness, Increased edema, Impaired sensation, Decreased skin integrity, Decreased knowledge of precautions, Decreased knowledge of use of DME, Decreased scar mobility, Impaired UE functional use, Pain, Decreased strength  Visit Diagnosis: Stiffness of right hand, not elsewhere classified  Stiffness of right wrist, not elsewhere classified  Muscle weakness (generalized)  Scar condition and fibrosis of skin  Other disturbances of skin sensation  Pain in right arm    Problem List There are no active problems to display for this patient.   Rosalyn Gess OTR/L,CLT 11/22/2017, 5:34 PM  [facility] [address] [place], [address] Phone: [phone]   Fax:  [phone]  Name: Francisco Johns MRN: [id number] Date of Birth: Apr 14, 1976

## 2017-12-06 ENCOUNTER — Ambulatory Visit: Payer: 59 | Admitting: Occupational Therapy

## 2017-12-06 DIAGNOSIS — M6281 Muscle weakness (generalized): Secondary | ICD-10-CM | POA: Diagnosis not present

## 2017-12-06 DIAGNOSIS — M25641 Stiffness of right hand, not elsewhere classified: Secondary | ICD-10-CM | POA: Diagnosis not present

## 2017-12-06 DIAGNOSIS — R208 Other disturbances of skin sensation: Secondary | ICD-10-CM

## 2017-12-06 DIAGNOSIS — L905 Scar conditions and fibrosis of skin: Secondary | ICD-10-CM

## 2017-12-06 DIAGNOSIS — M25631 Stiffness of right wrist, not elsewhere classified: Secondary | ICD-10-CM

## 2017-12-06 DIAGNOSIS — M79601 Pain in right arm: Secondary | ICD-10-CM | POA: Diagnosis not present

## 2017-12-06 NOTE — Therapy (Signed)
[facility] [address] [place], [address] Phone: [phone]   Fax:  [phone]  Occupational Therapy Treatment  Patient Details  Name: Francisco Johns MRN: [id number] Date of Birth: Sep 03, 1975 Referring Provider: Serafina Royals   Encounter Date: 12/06/2017  OT End of Session - 12/06/17 1434    Visit Number  20    Number of Visits  23    Date for OT Re-Evaluation  12/20/17    OT Start Time  1017    OT Stop Time  1116    OT Time Calculation (min)  59 min    Activity Tolerance  Patient tolerated treatment well    Behavior During Therapy  [facility] for tasks assessed/performed       Past Medical History:  Diagnosis Date  . Metatarsal fracture    right big toe    Past Surgical History:  Procedure Laterality Date  . ankle surgery    . ORIF TOE FRACTURE Right 12/09/2015   Procedure: OPEN REDUCTION INTERNAL FIXATION (ORIF) METATARSAL (TOE) FRACTURE, first metatarsal;  Surgeon: Renette Butters, MD;  Location: [facility];  Service: Orthopedics;  Laterality: Right;  . 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  Subjective Assessment - 12/06/17 1031    Subjective   Did the gripper - but pinkie not coing down all the way - more strenght in grip it feels and did 5 lbs for wrist , elbow and shoulder - felt okay 2 - 3sets depending on exercises     Patient Stated Goals  I want to be able to get my movement in my R hand back and be able to bend the pinkie - I am R hand dominant     Currently in Pain?  No/denies           Grip same and AROM in 4th and 5th digit same   MC flexion 5th 65 and PIP 100   Scar adhesion still present - ? Scar adhesion for composite flexion  But pt able to hold rake and broom better with 5th digit       OT Treatments/Exercises (OP) - 12/06/17 0001      Moist Heat Therapy   Number  Minutes Moist Heat  8 Minutes    Moist Heat Location  Hand;Wrist   prior to soft tissue massage and gripper to increaes ROM        Patient seen for scar mobilizations to right wrist and forearm, manual soft tissue mob techniques from therapist with techniques to address adhesions - use vibration  Performed PROM composite flexion of digits to palm , Followed by Place and hold   Hand gripper done at 15 lbs and then decreaseto  10 lbs - intrinsic fist 10 reps  And then full fist place and hold - pt to work on composite ( MC flexion)  10 reps  increase to 20 and 25 lbs in the next month   and increase to sets  Drop to 1 sets - increase sets  add putty smaller ball for 5th digit fist  Kinesiotape to cont with for  adhesion - in3starspattern, pt to do at home during Francisco Cont with at home Thumb PA and RA place and hold with rubber band -4+/5 for PA - reinforce again RA - need to do place and hold  Performed Pushing  into wall for digits extension with arching of palm - and some flexion in digits including 5th,  Prayer stretch performed 10 reps, verbal cues for form, nerve glide for composite extension stretch on wall with therapist demo and verbal instruction.    cont with 5 lbs weight able to do scapula retraction , tricep, ext rotation 10 reps  Increase to 3 sets this week  And sup/pro, RD,UD   increase sets and reps gradually   wrist extention  With gravity and roll into fingers and then up into grip  4 lbs and then without gravity  And then 5  lbs over head cont with if not pain  At 2 sets now - increase to 3 sets   but one time Francisco  Can tape 4thand 5th into fist to maintain grip on weight      OT Education - 12/06/17 1434    Education provided  Yes    Education Details  cont HEP     Person(s) Educated  Patient    Methods  Explanation;Demonstration    Comprehension  Verbalized understanding;Returned demonstration       OT Short Term Goals - 11/08/17  1349      OT SHORT TERM GOAL #1   Title  Pt to be ind in HEP in use of splint , PROM for digits flexion and AROM initiate for digits and wrist  flexion and extention without pain     Status  Achieved      OT SHORT TERM GOAL #2   Title  Pt digits flexion improve PROM to touch palm and AROM to 3 cm object out of palm     Status  Achieved      OT SHORT TERM GOAL #3   Title  R wrist AROM improve to same as prior to surgery to be able to initiate strengthening     Status  Achieved      OT SHORT TERM GOAL #4   Title  Sensation improve in R hand to identify  ojbects in palm 50% of time     Baseline  3.61 this date except DIP of 3rd thru 5th     Time  6    Status  On-going    Target Date  12/20/17        OT Long Term Goals - 11/08/17 1350      OT LONG TERM GOAL #1   Title  R digits flexion improve for pt to hold 1 cm object in palm -and 4th and 5th digit PIP more than 70 degrees     Baseline  70 degrees of PIP flexion , MC flexion pip - depend how he intiate grip with MC flexion or PIP flexion     Time  6    Period  Weeks    Status  On-going    Target Date  12/20/17      OT LONG TERM GOAL #2   Title  Grip strength increase in R hand to more than 10 lbs to carry and use hand in driving     Baseline  Grip R 25 lbs this date - but pick up 8 lbs     Time  6    Status  On-going    Target Date  12/20/17      OT LONG TERM GOAL #3   Title  Wrist strength increase for pt to do more than 2 lbs for wrist to prepare to carry and do yard work  Status  Achieved      OT LONG TERM GOAL #4   Title  Pt to use 5 lbs at wrist and elbow without pain or discomfort to use in ADL's and IADL's     Baseline  use 3-4 lbs - and RTB this date - still need to be carefull that tennis elbow symptoms do not happen    Time  6    Period  Weeks    Status  On-going    Target Date  12/20/17            Plan - 12/06/17 1435    Clinical Impression Statement  Pt grip did mot improve this date on  dynometer compare to 2 wks ago and cont show decrease MC flexion during composite flexion - stay at 65 degrees- after heat more flexible and PROM - pt to do heat prior to gripping and putty-  and cont scar massage and increase weight for wrist , elbow and shoulder        Patient will benefit from skilled therapeutic intervention in order to improve the following deficits and impairments:     Visit Diagnosis: Stiffness of right hand, not elsewhere classified  Stiffness of right wrist, not elsewhere classified  Scar condition and fibrosis of skin  Other disturbances of skin sensation  Pain in right arm    Problem List There are no active problems to display for this patient.   Rosalyn Gess OTR/l,CLT 12/06/2017, 2:37 PM  [facility] [address], [place], [address] Phone: [phone]   Fax:  [phone]  Name: DONNAVAN COVAULT MRN: [id number] Date of Birth: 1976-02-19

## 2018-01-15 DIAGNOSIS — S58111A Complete traumatic amputation at level between elbow and wrist, right arm, initial encounter: Secondary | ICD-10-CM | POA: Diagnosis not present

## 2018-05-05 DIAGNOSIS — Z08 Encounter for follow-up examination after completed treatment for malignant neoplasm: Secondary | ICD-10-CM | POA: Diagnosis not present

## 2018-05-05 DIAGNOSIS — Z85820 Personal history of malignant melanoma of skin: Secondary | ICD-10-CM | POA: Diagnosis not present

## 2018-05-05 DIAGNOSIS — Z1283 Encounter for screening for malignant neoplasm of skin: Secondary | ICD-10-CM | POA: Diagnosis not present

## 2018-07-07 DIAGNOSIS — H6123 Impacted cerumen, bilateral: Secondary | ICD-10-CM | POA: Insufficient documentation

## 2018-07-07 DIAGNOSIS — H900 Conductive hearing loss, bilateral: Secondary | ICD-10-CM | POA: Insufficient documentation

## 2018-11-07 DIAGNOSIS — S58111A Complete traumatic amputation at level between elbow and wrist, right arm, initial encounter: Secondary | ICD-10-CM | POA: Diagnosis not present

## 2018-11-07 DIAGNOSIS — S68411D Complete traumatic amputation of right hand at wrist level, subsequent encounter: Secondary | ICD-10-CM | POA: Diagnosis not present

## 2018-12-17 DIAGNOSIS — H5213 Myopia, bilateral: Secondary | ICD-10-CM | POA: Diagnosis not present

## 2018-12-17 DIAGNOSIS — H52223 Regular astigmatism, bilateral: Secondary | ICD-10-CM | POA: Diagnosis not present

## 2018-12-17 DIAGNOSIS — H524 Presbyopia: Secondary | ICD-10-CM | POA: Diagnosis not present

## 2019-02-19 DIAGNOSIS — M7540 Impingement syndrome of unspecified shoulder: Secondary | ICD-10-CM | POA: Insufficient documentation

## 2019-02-19 DIAGNOSIS — M7541 Impingement syndrome of right shoulder: Secondary | ICD-10-CM | POA: Diagnosis not present

## 2019-03-05 DIAGNOSIS — M7541 Impingement syndrome of right shoulder: Secondary | ICD-10-CM | POA: Diagnosis not present

## 2019-03-05 MED FILL — MELOXICAM 15 MG TABLET: 15 | 30 days supply | Qty: 30 | Fill #0

## 2019-05-07 DIAGNOSIS — Z85820 Personal history of malignant melanoma of skin: Secondary | ICD-10-CM | POA: Diagnosis not present

## 2019-05-07 DIAGNOSIS — D225 Melanocytic nevi of trunk: Secondary | ICD-10-CM | POA: Diagnosis not present

## 2019-05-07 DIAGNOSIS — Z1283 Encounter for screening for malignant neoplasm of skin: Secondary | ICD-10-CM | POA: Diagnosis not present

## 2019-05-07 DIAGNOSIS — Z08 Encounter for follow-up examination after completed treatment for malignant neoplasm: Secondary | ICD-10-CM | POA: Diagnosis not present

## 2019-11-12 DIAGNOSIS — Z23 Encounter for immunization: Secondary | ICD-10-CM | POA: Diagnosis not present

## 2019-11-20 ENCOUNTER — Ambulatory Visit: Payer: Self-pay | Admitting: Surgery

## 2019-11-20 DIAGNOSIS — K429 Umbilical hernia without obstruction or gangrene: Secondary | ICD-10-CM | POA: Diagnosis not present

## 2019-11-20 NOTE — H&P (Signed)
Francisco Johns Appointment: 11/20/2019 11:00 AM Location: [facility] Patient #: [id number] DOB: [birth year] Married / Language: Francisco Johns / Race: White Male  History of Present Illness Francisco Moores A. Thaddeus Evitts MD; 11/20/2019 1:33 PM) Patient words: Patient presents for evaluation of umbilical hernia. He's had it for many years but it is getting larger and causing mild to moderate discomfort especially when he bends over. He denies nausea, vomiting, chills, fever. The area pops in and pops out without difficulty.  The patient is a 44 year old male.   Allergies Francisco Johns, [place]; 11/20/2019 11:22 AM) No Known Drug Allergies [11/20/2019]: Allergies Reconciled  Medication History Francisco Johns, [place]; 11/20/2019 11:22 AM) No Current Medications Medications Reconciled  Social History Francisco Johns, [place]; 11/20/2019 11:22 AM) Alcohol use Remotely quit alcohol use.  Family History Francisco Johns, [place]; 11/20/2019 11:22 AM) First Degree Relatives No pertinent family history     Review of Systems Francisco Johns RMA; 11/20/2019 11:22 AM) Musculoskeletal Not Present- Back Pain, Joint Pain, Joint Stiffness, Muscle Pain, Muscle Weakness and Swelling of Extremities. Neurological Not Present- Decreased Memory, Fainting, Headaches, Numbness, Seizures, Tingling, Tremor, Trouble walking and Weakness.  Vitals Francisco Johns RMA; 11/20/2019 11:23 AM) 11/20/2019 11:22 AM Weight: 184.5 lb Height: 68in Body Surface Area: 1.98 m Body Mass Index: 28.05 kg/m  Temp.: 98.51F  Pulse: 87 (Regular)  P.OX: 96% (Room air) BP: 120/68(Sitting, Left Arm, Standard)        Physical Exam (Francisco Ludington A. Osceola Holian MD; 11/20/2019 1:33 PM)  General Mental Status-Alert. General Appearance-Consistent with stated age. Hydration-Well hydrated. Voice-Normal.  Eye Eyeball - Bilateral-Extraocular movements intact. Sclera/Conjunctiva - Bilateral-No scleral icterus.  Chest and Lung  Exam Chest and lung exam reveals -quiet, even and easy respiratory effort with no use of accessory muscles and on auscultation, normal breath sounds, no adventitious sounds and normal vocal resonance. Inspection Chest Wall - Normal. Back - normal.  Cardiovascular Cardiovascular examination reveals -normal heart sounds, regular rate and rhythm with no murmurs and normal pedal pulses bilaterally.  Abdomen Inspection Inspection of the abdomen reveals - No Hernias. Skin - Scar - no surgical scars. Palpation/Percussion Palpation and Percussion of the abdomen reveal - Soft, Non Tender, No Rebound tenderness, No Rigidity (guarding) and No hepatosplenomegaly. Auscultation Auscultation of the abdomen reveals - Bowel sounds normal. Note: Reducible moderate size umbilical hernia. Soft nontender.  Neurologic Neurologic evaluation reveals -alert and oriented x 3 with no impairment of recent or remote memory. Mental Status-Normal.  Musculoskeletal Normal Exam - Left-Upper Extremity Strength Normal and Lower Extremity Strength Normal. Normal Exam - Right-Upper Extremity Strength Normal and Lower Extremity Strength Normal.    Assessment & Plan (Francisco Dunivan A. Pryce Folts MD; 09/27/3708 6:26 PM)  UMBILICAL HERNIA WITHOUT OBSTRUCTION AND WITHOUT GANGRENE (K42.9) Impression: Total time 45 minutes for examination, EHR documentation, face-to-face time, review of chart, review of medical records and discussion of surgery with options and potential complications as well as mesh use. The risk of hernia repair include bleeding, infection, organ injury, bowel injury, bladder injury, nerve injury recurrent hernia, blood clots, worsening of underlying condition, chronic pain, mesh use, open surgery, death, and the need for other operations. Pt agrees to proceed  Current Plans You are being scheduled for surgery- Our schedulers will call you.  You should hear from our office's scheduling department within  5 working days about the location, date, and time of surgery. We try to make accommodations for patient's preferences in scheduling surgery, but sometimes the OR schedule or the surgeon's schedule prevents [place]  from making those accommodations.  If you have not heard from our office [phone]) in 5 working days, call the office and ask for your surgeon's nurse.  If you have other questions about your diagnosis, plan, or surgery, call the office and ask for your surgeon's nurse.  Pt Education - [facility] Mesh education: discussed with patient and provided information. [facility] Consent - Hernia Repair - Ventral/Incisional/Umbilical : discussed with patient and provided information.

## 2019-12-04 DIAGNOSIS — Z23 Encounter for immunization: Secondary | ICD-10-CM | POA: Diagnosis not present

## 2019-12-18 DIAGNOSIS — H5213 Myopia, bilateral: Secondary | ICD-10-CM | POA: Diagnosis not present

## 2019-12-18 DIAGNOSIS — H524 Presbyopia: Secondary | ICD-10-CM | POA: Diagnosis not present

## 2019-12-18 DIAGNOSIS — H52223 Regular astigmatism, bilateral: Secondary | ICD-10-CM | POA: Diagnosis not present

## 2019-12-21 DIAGNOSIS — M25572 Pain in left ankle and joints of left foot: Secondary | ICD-10-CM | POA: Diagnosis not present

## 2019-12-21 MED FILL — METHYLPREDNISOLONE 4 MG TAB: 4 | 6 days supply | Qty: 21 | Fill #0

## 2019-12-21 MED FILL — NITROGLYCERIN 0.2 MG/HR PTC: 0.2 | 40 days supply | Qty: 10 | Fill #0

## 2019-12-24 ENCOUNTER — Encounter (HOSPITAL_BASED_OUTPATIENT_CLINIC_OR_DEPARTMENT_OTHER): Payer: Self-pay | Admitting: Surgery

## 2019-12-24 ENCOUNTER — Other Ambulatory Visit: Payer: Self-pay

## 2019-12-24 NOTE — Progress Notes (Signed)

## 2019-12-29 ENCOUNTER — Other Ambulatory Visit (HOSPITAL_COMMUNITY)
Admission: RE | Admit: 2019-12-29 | Discharge: 2019-12-29 | Disposition: A | Discharge status: Home / Self Care | Payer: 59 | Source: Ambulatory Visit | Attending: Surgery | Admitting: Surgery

## 2019-12-29 DIAGNOSIS — Z20822 Contact with and (suspected) exposure to covid-19: Secondary | ICD-10-CM | POA: Diagnosis not present

## 2019-12-29 DIAGNOSIS — Z01812 Encounter for preprocedural laboratory examination: Secondary | ICD-10-CM | POA: Diagnosis not present

## 2019-12-29 LAB — SARS CORONAVIRUS 2 (TAT 6-24 HRS): SARS Coronavirus 2: NEGATIVE

## 2019-12-31 ENCOUNTER — Ambulatory Visit (HOSPITAL_BASED_OUTPATIENT_CLINIC_OR_DEPARTMENT_OTHER): Payer: 59 | Admitting: Anesthesiology

## 2019-12-31 ENCOUNTER — Encounter (HOSPITAL_BASED_OUTPATIENT_CLINIC_OR_DEPARTMENT_OTHER)
Admission: RE | Disposition: A | Discharge status: Home / Self Care | Payer: Self-pay | Source: Home / Self Care | Attending: Surgery

## 2019-12-31 ENCOUNTER — Other Ambulatory Visit: Payer: Self-pay

## 2019-12-31 ENCOUNTER — Ambulatory Visit (HOSPITAL_BASED_OUTPATIENT_CLINIC_OR_DEPARTMENT_OTHER)
Admission: RE | Admit: 2019-12-31 | Discharge: 2019-12-31 | Disposition: A | Discharge status: Home / Self Care | Payer: 59 | Attending: Surgery | Admitting: Surgery

## 2019-12-31 ENCOUNTER — Encounter (HOSPITAL_BASED_OUTPATIENT_CLINIC_OR_DEPARTMENT_OTHER): Payer: Self-pay | Admitting: Surgery

## 2019-12-31 DIAGNOSIS — K429 Umbilical hernia without obstruction or gangrene: Secondary | ICD-10-CM | POA: Diagnosis not present

## 2019-12-31 HISTORY — PX: UMBILICAL HERNIA REPAIR: SHX196

## 2019-12-31 SURGERY — REPAIR, HERNIA, UMBILICAL, ADULT
Anesthesia: General | Site: Abdomen

## 2019-12-31 MED ORDER — GABAPENTIN 300 MG PO CAPS
300.0000 mg | ORAL_CAPSULE | ORAL | Status: AC
  Administered 2019-12-31: 300 mg via ORAL

## 2019-12-31 MED ORDER — OXYCODONE HCL 5 MG PO TABS: 5.0000 mg | ORAL_TABLET | Freq: Four times a day (QID) | ORAL | 0 refills | Status: DC | PRN

## 2019-12-31 MED ORDER — GABAPENTIN 300 MG PO CAPS
ORAL_CAPSULE | ORAL | Status: AC
  Filled 2019-12-31: qty 1

## 2019-12-31 MED ORDER — PROMETHAZINE HCL 25 MG/ML IJ SOLN: 6.2500 mg | INTRAMUSCULAR | Status: DC | PRN

## 2019-12-31 MED ORDER — BUPIVACAINE HCL (PF) 0.25 % IJ SOLN
INTRAMUSCULAR | Status: DC | PRN
  Administered 2019-12-31: 20 mL

## 2019-12-31 MED ORDER — LIDOCAINE 2% (20 MG/ML) 5 ML SYRINGE
INTRAMUSCULAR | Status: DC | PRN
  Administered 2019-12-31: 100 mg via INTRAVENOUS

## 2019-12-31 MED ORDER — CEFAZOLIN SODIUM-DEXTROSE 2-4 GM/100ML-% IV SOLN
2.0000 g | INTRAVENOUS | Status: AC
  Administered 2019-12-31: 2 g via INTRAVENOUS

## 2019-12-31 MED ORDER — ACETAMINOPHEN 500 MG PO TABS
ORAL_TABLET | ORAL | Status: AC
  Filled 2019-12-31: qty 2

## 2019-12-31 MED ORDER — FENTANYL CITRATE (PF) 100 MCG/2ML IJ SOLN: 25.0000 ug | INTRAMUSCULAR | Status: DC | PRN

## 2019-12-31 MED ORDER — DEXAMETHASONE SODIUM PHOSPHATE 10 MG/ML IJ SOLN
INTRAMUSCULAR | Status: DC | PRN
  Administered 2019-12-31: 5 mg via INTRAVENOUS

## 2019-12-31 MED ORDER — LACTATED RINGERS IV SOLN: INTRAVENOUS | Status: DC

## 2019-12-31 MED ORDER — CELECOXIB 200 MG PO CAPS
200.0000 mg | ORAL_CAPSULE | ORAL | Status: AC
  Administered 2019-12-31: 200 mg via ORAL

## 2019-12-31 MED ORDER — CELECOXIB 200 MG PO CAPS
ORAL_CAPSULE | ORAL | Status: AC
  Filled 2019-12-31: qty 1

## 2019-12-31 MED ORDER — PROPOFOL 10 MG/ML IV BOLUS
INTRAVENOUS | Status: DC | PRN
  Administered 2019-12-31: 200 mg via INTRAVENOUS

## 2019-12-31 MED ORDER — FENTANYL CITRATE (PF) 100 MCG/2ML IJ SOLN
INTRAMUSCULAR | Status: DC | PRN
Start: 2019-12-31 — End: 2019-12-31
  Administered 2019-12-31 (×2): 50 ug via INTRAVENOUS

## 2019-12-31 MED ORDER — IBUPROFEN 800 MG PO TABS: 800.0000 mg | ORAL_TABLET | Freq: Three times a day (TID) | ORAL | 0 refills | Status: DC | PRN

## 2019-12-31 MED ORDER — CHLORHEXIDINE GLUCONATE CLOTH 2 % EX PADS: 6.0000 | MEDICATED_PAD | Freq: Once | CUTANEOUS | Status: DC

## 2019-12-31 MED ORDER — CELECOXIB 200 MG PO CAPS: 200.0000 mg | ORAL_CAPSULE | Freq: Once | ORAL | Status: DC

## 2019-12-31 MED ORDER — OXYCODONE HCL 5 MG PO TABS
5.0000 mg | ORAL_TABLET | Freq: Once | ORAL | Status: AC
  Administered 2019-12-31: 5 mg via ORAL

## 2019-12-31 MED ORDER — MIDAZOLAM HCL 2 MG/2ML IJ SOLN
INTRAMUSCULAR | Status: AC
  Filled 2019-12-31: qty 2

## 2019-12-31 MED ORDER — MIDAZOLAM HCL 2 MG/2ML IJ SOLN
INTRAMUSCULAR | Status: DC | PRN
  Administered 2019-12-31: 2 mg via INTRAVENOUS

## 2019-12-31 MED ORDER — ACETAMINOPHEN 500 MG PO TABS: 1000.0000 mg | ORAL_TABLET | Freq: Once | ORAL | Status: DC

## 2019-12-31 MED ORDER — FENTANYL CITRATE (PF) 100 MCG/2ML IJ SOLN
INTRAMUSCULAR | Status: AC
  Filled 2019-12-31: qty 2

## 2019-12-31 MED ORDER — CEFAZOLIN SODIUM-DEXTROSE 2-4 GM/100ML-% IV SOLN
INTRAVENOUS | Status: AC
  Filled 2019-12-31: qty 100

## 2019-12-31 MED ORDER — OXYCODONE HCL 5 MG PO TABS
ORAL_TABLET | ORAL | Status: AC
  Filled 2019-12-31: qty 1

## 2019-12-31 MED ORDER — ONDANSETRON HCL 4 MG/2ML IJ SOLN
INTRAMUSCULAR | Status: DC | PRN
  Administered 2019-12-31: 4 mg via INTRAVENOUS

## 2019-12-31 MED ORDER — ACETAMINOPHEN 500 MG PO TABS
1000.0000 mg | ORAL_TABLET | ORAL | Status: AC
  Administered 2019-12-31: 1000 mg via ORAL

## 2019-12-31 MED FILL — oxyCODONE HCL 5 MG TABS: 5 | 4 days supply | Qty: 15 | Fill #0

## 2019-12-31 MED FILL — IBUPROFEN 800 MG TABS: 800 | 10 days supply | Qty: 30 | Fill #0

## 2019-12-31 SURGICAL SUPPLY — 53 items
ADH SKN CLS APL DERMABOND .7 (GAUZE/BANDAGES/DRESSINGS) ×1
APL PRP STRL LF DISP 70% ISPRP (MISCELLANEOUS) ×1
APL SKNCLS STERI-STRIP NONHPOA (GAUZE/BANDAGES/DRESSINGS)
BENZOIN TINCTURE PRP APPL 2/3 (GAUZE/BANDAGES/DRESSINGS) IMPLANT
BLADE CLIPPER SURG (BLADE) ×3 IMPLANT
BLADE SURG 10 STRL SS (BLADE) IMPLANT
BLADE SURG 15 STRL LF DISP TIS (BLADE) ×1 IMPLANT
BLADE SURG 15 STRL SS (BLADE) ×3
CANISTER SUCT 1200ML W/VALVE (MISCELLANEOUS) IMPLANT
CHLORAPREP W/TINT 26 (MISCELLANEOUS) ×3 IMPLANT
CLOSURE WOUND 1/2 X4 (GAUZE/BANDAGES/DRESSINGS)
COVER BACK TABLE 60X90IN (DRAPES) ×3 IMPLANT
COVER MAYO STAND STRL (DRAPES) ×3 IMPLANT
COVER WAND RF STERILE (DRAPES) IMPLANT
DECANTER SPIKE VIAL GLASS SM (MISCELLANEOUS) IMPLANT
DERMABOND ADVANCED (GAUZE/BANDAGES/DRESSINGS) ×2
DERMABOND ADVANCED .7 DNX12 (GAUZE/BANDAGES/DRESSINGS) ×1 IMPLANT
DRAPE LAPAROTOMY TRNSV 102X78 (DRAPES) ×3 IMPLANT
DRAPE UTILITY XL STRL (DRAPES) ×3 IMPLANT
DRSG TEGADERM 4X4.75 (GAUZE/BANDAGES/DRESSINGS) IMPLANT
ELECT COATED BLADE 2.86 ST (ELECTRODE) ×3 IMPLANT
ELECT REM PT RETURN 9FT ADLT (ELECTROSURGICAL) ×3
ELECTRODE REM PT RTRN 9FT ADLT (ELECTROSURGICAL) ×1 IMPLANT
GLOVE BIOGEL PI IND STRL 6.5 (GLOVE) ×1 IMPLANT
GLOVE BIOGEL PI IND STRL 7.0 (GLOVE) ×1 IMPLANT
GLOVE BIOGEL PI IND STRL 8 (GLOVE) ×1 IMPLANT
GLOVE BIOGEL PI INDICATOR 6.5 (GLOVE) ×2
GLOVE BIOGEL PI INDICATOR 7.0 (GLOVE) ×2
GLOVE BIOGEL PI INDICATOR 8 (GLOVE) ×2
GLOVE ECLIPSE 8.0 STRL XLNG CF (GLOVE) ×3 IMPLANT
GLOVE SURG SS PI 6.5 STRL IVOR (GLOVE) ×3 IMPLANT
GOWN STRL REUS W/ TWL LRG LVL3 (GOWN DISPOSABLE) ×2 IMPLANT
GOWN STRL REUS W/TWL LRG LVL3 (GOWN DISPOSABLE) ×6
MESH VENTRALEX ST 1-7/10 CRC S (Mesh General) ×3 IMPLANT
NEEDLE HYPO 22GX1.5 SAFETY (NEEDLE) IMPLANT
NEEDLE HYPO 25X1 1.5 SAFETY (NEEDLE) ×3 IMPLANT
NS IRRIG 1000ML POUR BTL (IV SOLUTION) IMPLANT
PACK BASIN DAY SURGERY FS (CUSTOM PROCEDURE TRAY) ×3 IMPLANT
PENCIL SMOKE EVACUATOR (MISCELLANEOUS) ×3 IMPLANT
SLEEVE SCD COMPRESS KNEE MED (MISCELLANEOUS) ×3 IMPLANT
STAPLER VISISTAT 35W (STAPLE) IMPLANT
STRIP CLOSURE SKIN 1/2X4 (GAUZE/BANDAGES/DRESSINGS) IMPLANT
SUT MON AB 4-0 PC3 18 (SUTURE) ×3 IMPLANT
SUT NOVA NAB DX-16 0-1 5-0 T12 (SUTURE) ×6 IMPLANT
SUT SILK 3 0 SH 30 (SUTURE) IMPLANT
SUT VIC AB 2-0 SH 27 (SUTURE) ×3
SUT VIC AB 2-0 SH 27XBRD (SUTURE) ×1 IMPLANT
SUT VICRYL 3-0 CR8 SH (SUTURE) ×3 IMPLANT
SYR CONTROL 10ML LL (SYRINGE) ×3 IMPLANT
TOWEL GREEN STERILE FF (TOWEL DISPOSABLE) ×3 IMPLANT
TUBE CONNECTING 20'X1/4 (TUBING)
TUBE CONNECTING 20X1/4 (TUBING) IMPLANT
YANKAUER SUCT BULB TIP NO VENT (SUCTIONS) IMPLANT

## 2019-12-31 NOTE — Discharge Instructions (Signed)
[facility] _______[facility]  UMBILICAL OR INGUINAL HERNIA REPAIR: POST OP INSTRUCTIONS  Always review your discharge instruction sheet given to you by the facility where your surgery was performed. IF YOU HAVE DISABILITY OR FAMILY LEAVE FORMS, YOU MUST BRING THEM TO THE OFFICE FOR PROCESSING.   DO NOT GIVE THEM TO YOUR DOCTOR.  1. A  prescription for pain medication may be given to you upon discharge.  Take your pain medication as prescribed, if needed.  If narcotic pain medicine is not needed, then you may take acetaminophen (Tylenol) or ibuprofen (Advil) as needed. 2. Take your usually prescribed medications unless otherwise directed. If you need a refill on your pain medication, please contact your pharmacy.  They will contact our office to request authorization. Prescriptions will not be filled after 5 pm or on week-ends. 3. You should follow a light diet the first 24 hours after arrival home, such as soup and crackers, etc.  Be sure to include lots of fluids daily.  Resume your normal diet the day after surgery. 4.Most patients will experience some swelling and bruising around the umbilicus or in the groin and scrotum.  Ice packs and reclining will help.  Swelling and bruising can take several days to resolve.  6. It is common to experience some constipation if taking pain medication after surgery.  Increasing fluid intake and taking a stool softener (such as Colace) will usually help or prevent this problem from occurring.  A mild laxative (Milk of Magnesia or Miralax) should be taken according to package directions if there are no bowel movements after 48 hours. 7. Unless discharge instructions indicate otherwise, you may remove your bandages 24-48 hours after surgery, and you may shower at that time.  You may have steri-strips (small skin tapes) in place directly over the incision.  These strips should be left on the skin for 7-10 days.  If your surgeon used skin glue on the  incision, you may shower in 24 hours.  The glue will flake off over the next 2-3 weeks.  Any sutures or staples will be removed at the office during your follow-up visit. 8. ACTIVITIES:  You may resume regular (light) daily activities beginning the next day--such as daily self-care, walking, climbing stairs--gradually increasing activities as tolerated.  You may have sexual intercourse when it is comfortable.  Refrain from any heavy lifting or straining until approved by your doctor.  a.You may drive when you are no longer taking prescription pain medication, you can comfortably wear a seatbelt, and you can safely maneuver your car and apply brakes. b.RETURN TO WORK:   _____________________________________________  9.You should see your doctor in the office for a follow-up appointment approximately 2-3 weeks after your surgery.  Make sure that you call for this appointment within a day or two after you arrive home to insure a convenient appointment time. 10.OTHER INSTRUCTIONS: _________________________    _____________________________________  WHEN TO CALL YOUR DOCTOR: 1. Fever over 101.0 2. Inability to urinate 3. Nausea and/or vomiting 4. Extreme swelling or bruising 5. Continued bleeding from incision. 6. Increased pain, redness, or drainage from the incision  The clinic staff is available to answer your questions during regular business hours.  Please don't hesitate to call and ask to speak to one of the nurses for clinical concerns.  If you have a medical emergency, go to the nearest emergency room or call 911.  A surgeon from [facility] is always on call at the hospital     [address] [place], [place], [place]  [address] ?  [address], [place], [place]   [address] [phone] ? [phone] ? FAX [phone] Web site: [email or web address]  Post Anesthesia Home Care Instructions  Activity: Get plenty of rest for the remainder of the day. A  responsible individual must stay with you for 24 hours following the procedure.  For the next 24 hours, DO NOT: -Drive a car -Paediatric nurse -Drink alcoholic beverages -Take any medication unless instructed by your physician -Make any legal decisions or sign important papers.  Meals: Start with liquid foods such as gelatin or soup. Progress to regular foods as tolerated. Avoid greasy, spicy, heavy foods. If nausea and/or vomiting occur, drink only clear liquids until the nausea and/or vomiting subsides. Call your physician if vomiting continues.  Special Instructions/Symptoms: Your throat may feel dry or sore from the anesthesia or the breathing tube placed in your throat during surgery. If this causes discomfort, gargle with warm salt water. The discomfort should disappear within 24 hours.  If you had a scopolamine patch placed behind your ear for the management of post- operative nausea and/or vomiting:  1. The medication in the patch is effective for 72 hours, after which it should be removed.  Wrap patch in a tissue and discard in the trash. Wash hands thoroughly with soap and water. 2. You may remove the patch earlier than 72 hours if you experience unpleasant side effects which may include dry mouth, dizziness or visual disturbances. 3. Avoid touching the patch. Wash your hands with soap and water after contact with the patch.   Next dose of Tylenol can be given at 4:45pm if needed. Next dose of Ibuprofen/Motrin can be given at 6:45pm if needed.  Next dose of Oxycodone can be given at 7:15pm if needed.

## 2019-12-31 NOTE — Anesthesia Procedure Notes (Signed)
Procedure Name: LMA Insertion Date/Time: 12/31/2019 11:09 AM Performed by: [place] Tennelle Taflinger C, CRNA Pre-anesthesia Checklist: Patient identified, Emergency Drugs available, Suction available and Patient being monitored Patient Re-evaluated:Patient Re-evaluated prior to induction Oxygen Delivery Method: Circle system utilized Preoxygenation: Pre-oxygenation with 100% oxygen Induction Type: IV induction Ventilation: Mask ventilation without difficulty LMA: LMA inserted LMA Size: 4.0 Number of attempts: 1 Airway Equipment and Method: Bite block Placement Confirmation: positive ETCO2 Tube secured with: Tape Dental Injury: Teeth and Oropharynx as per pre-operative assessment

## 2019-12-31 NOTE — Anesthesia Preprocedure Evaluation (Addendum)
Anesthesia Evaluation  Patient identified by MRN, date of birth, ID band Patient awake    Reviewed: Allergy & Precautions, NPO status , Patient's Chart, lab work & pertinent test results  History of Anesthesia Complications Negative for: history of anesthetic complications  Airway Mallampati: II  TM Distance: >3 FB Neck ROM: Full    Dental no notable dental hx. (+) Dental Advisory Given   Pulmonary neg pulmonary ROS,    Pulmonary exam normal        Cardiovascular negative cardio ROS Normal cardiovascular exam     Neuro/Psych negative neurological ROS     GI/Hepatic negative GI ROS, Neg liver ROS,   Endo/Other  negative endocrine ROS  Renal/GU negative Renal ROS     Musculoskeletal negative musculoskeletal ROS (+)   Abdominal   Peds  Hematology negative hematology ROS (+)   Anesthesia Other Findings   Reproductive/Obstetrics                            Anesthesia Physical Anesthesia Plan  ASA: I  Anesthesia Plan: General   Post-op Pain Management:    Induction: Intravenous  PONV Risk Score and Plan: 4 or greater and Ondansetron, Dexamethasone, Midazolam and Treatment may vary due to age or medical condition  Airway Management Planned: LMA  Additional Equipment:   Intra-op Plan:   Post-operative Plan: Extubation in OR  Informed Consent: I have reviewed the patients History and Physical, chart, labs and discussed the procedure including the risks, benefits and alternatives for the proposed anesthesia with the patient or authorized representative who has indicated his/her understanding and acceptance.     Dental advisory given  Plan Discussed with: Anesthesiologist and CRNA  Anesthesia Plan Comments:        Anesthesia Quick Evaluation

## 2019-12-31 NOTE — H&P (Signed)
Francisco Johns  Location: [facility] Patient #: [id number] DOB: 1976-03-14 Married / Language: English / Race: White Male  History of Present Illness Patient words: Patient presents for evaluation of umbilical hernia. He's had it for many years but it is getting larger and causing mild to moderate discomfort especially when he bends over. He denies nausea, vomiting, chills, fever. The area pops in and pops out without difficulty.  The patient is a 44 year old male.   Allergies No Known Drug Allergies [11/20/2019]: Allergies Reconciled  Medication History  No Current Medications Medications Reconciled  Social History  Alcohol use Remotely quit alcohol use.  Family HistoryFirst Degree Relatives No pertinent family history     Review of Systems  Musculoskeletal Not Present- Back Pain, Joint Pain, Joint Stiffness, Muscle Pain, Muscle Weakness and Swelling of Extremities. Neurological Not Present- Decreased Memory, Fainting, Headaches, Numbness, Seizures, Tingling, Tremor, Trouble walking and Weakness.  Vitals 11/20/2019 11:22 AM Weight: 184.5 lb Height: 68in Body Surface Area: 1.98 m Body Mass Index: 28.05 kg/m  Temp.: 98.87F  Pulse: 87 (Regular)  P.OX: 96% (Room air) BP: 120/68(Sitting, Left Arm, Standard)        Physical Exam   General Mental Status-Alert. General Appearance-Consistent with stated age. Hydration-Well hydrated. Voice-Normal.  Eye Eyeball - Bilateral-Extraocular movements intact. Sclera/Conjunctiva - Bilateral-No scleral icterus.  Chest and Lung Exam Chest and lung exam reveals -quiet, even and easy respiratory effort with no use of accessory muscles and on auscultation, normal breath sounds, no adventitious sounds and normal vocal resonance. Inspection Chest Wall - Normal. Back - normal.  Cardiovascular Cardiovascular examination reveals -normal heart sounds, regular  rate and rhythm with no murmurs and normal pedal pulses bilaterally.  Abdomen Inspection Inspection of the abdomen reveals - No Hernias. Skin - Scar - no surgical scars. Palpation/Percussion Palpation and Percussion of the abdomen reveal - Soft, Non Tender, No Rebound tenderness, No Rigidity (guarding) and No hepatosplenomegaly. Auscultation Auscultation of the abdomen reveals - Bowel sounds normal. Note: Reducible moderate size umbilical hernia. Soft nontender.  Neurologic Neurologic evaluation reveals -alert and oriented x 3 with no impairment of recent or remote memory. Mental Status-Normal.  Musculoskeletal Normal Exam - Left-Upper Extremity Strength Normal and Lower Extremity Strength Normal. Normal Exam - Right-Upper Extremity Strength Normal and Lower Extremity Strength Normal.    Assessment & Plan (  UMBILICAL HERNIA WITHOUT OBSTRUCTION AND WITHOUT GANGRENE (K42.9) Impression: Total time 45 minutes for examination, EHR documentation, face-to-face time, review of chart, review of medical records and discussion of surgery with options and potential complications as well as mesh use. The risk of hernia repair include bleeding, infection, organ injury, bowel injury, bladder injury, nerve injury recurrent hernia, blood clots, worsening of underlying condition, chronic pain, mesh use, open surgery, death, and the need for other operations. Pt agrees to proceed  Current Plans You are being scheduled for surgery- Our schedulers will call you.  You should hear from our office's scheduling department within 5 working days about the location, date, and time of surgery. We try to make accommodations for patient's preferences in scheduling surgery, but sometimes the OR schedule or the surgeon's schedule prevents [place] from making those accommodations.  If you have not heard from our office [phone]) in 5 working days, call the office and ask for your surgeon's  nurse.  If you have other questions about your diagnosis, plan, or surgery, call the office and ask for your surgeon's nurse.  Pt Education - [facility] Mesh education: discussed  with patient and provided information. [facility] Consent - Hernia Repair - Ventral/Incisional/Umbilical : discussed with patient and provided information.

## 2019-12-31 NOTE — Anesthesia Postprocedure Evaluation (Signed)
Anesthesia Post Note  Patient: Francisco Johns  Procedure(s) Performed: HERNIA REPAIR UMBILICAL WITH MESH (N/A Abdomen)     Patient location during evaluation: PACU Anesthesia Type: General Level of consciousness: sedated Pain management: pain level controlled Vital Signs Assessment: post-procedure vital signs reviewed and stable Respiratory status: spontaneous breathing and respiratory function stable Cardiovascular status: stable Postop Assessment: no apparent nausea or vomiting Anesthetic complications: no   No complications documented.  Last Vitals:  Vitals:   12/31/19 1230 12/31/19 1316  BP: (!) 134/94 116/82  Pulse: 69 64  Resp: 18 18  Temp:  36.6 C  SpO2: 98% 100%    Last Pain:  Vitals:   12/31/19 1316  TempSrc:   PainSc: 7                  Rudra Hobbins DANIEL

## 2019-12-31 NOTE — Interval H&P Note (Signed)
History and Physical Interval Note:  12/31/2019 10:50 AM  Francisco Johns  has presented today for surgery, with the diagnosis of UMBILICAL HERNIA.  The various methods of treatment have been discussed with the patient and family. After consideration of risks, benefits and other options for treatment, the patient has consented to  Procedure(s): HERNIA REPAIR UMBILICAL WITH MESH (N/A) as a surgical intervention.  The patient's history has been reviewed, patient examined, no change in status, stable for surgery.  I have reviewed the patient's chart and labs.  Questions were answered to the patient's satisfaction.     Turner Daniels MD

## 2019-12-31 NOTE — Transfer of Care (Signed)
Immediate Anesthesia Transfer of Care Note  Patient: Francisco Johns  Procedure(s) Performed: HERNIA REPAIR UMBILICAL WITH MESH (N/A Abdomen)  Patient Location: PACU  Anesthesia Type:General  Level of Consciousness: awake, alert  and oriented  Airway & Oxygen Therapy: Patient Spontanous Breathing and Patient connected to face mask oxygen  Post-op Assessment: Report given to RN and Post -op Vital signs reviewed and stable  Post vital signs: Reviewed and stable  Last Vitals:  Vitals Value Taken Time  BP 130/92 12/31/19 1207  Temp    Pulse 79 12/31/19 1210  Resp 20 12/31/19 1210  SpO2 98 % 12/31/19 1210  Vitals shown include unvalidated device data.  Last Pain:  Vitals:   12/31/19 1016  TempSrc: Oral  PainSc: 0-No pain      Patients Stated Pain Goal: 3 (15/05/69 7948)  Complications: No complications documented.

## 2019-12-31 NOTE — Op Note (Signed)
Francisco Johns [birth year] [id number] 12/31/2019  Preoperative diagnosis: umbilical hernia reducible   Postoperative diagnosis: same   Procedure: Umbilical Hernia Repair with mesh   Surgeon:Jahmil Macleod, MD, FACS  Anesthesia: General   Clinical History and Indications: Pt presents for repair of symptomatic umbilical hernia.The risk of hernia repair include bleeding,  Infection,   Recurrence of the hernia,  Mesh use, chronic pain,  Organ injury,  Bowel injury,  Bladder injury,   nerve injury with numbness around the incision,  Death,  and worsening of preexisting  medical problems.  The alternatives to surgery have been discussed as well..  Long term expectations of both operative and non operative treatments have been discussed.   The patient agrees to proceed.  Procedure: The patient was seen in the preoperative area and the plans for the procedure reviewed again. He  had no further questions. I marked the area of the umbilicus as the operative site. He wishes to prodeed.  The patient was taken to the operating room and after satisfactory general  anesthesia had been obtained the area was clipped as needed, prepped and draped. The timeout was performed.  I used some 0.25 % marcaine local anesthesia to help with postoperative pain management. This was infiltrated around the umbilical area and additional infiltrated as I worked.  A curvilinear incision was made on the inferior aspect of the umbilicus. The umbilical skin was elevated off of the hernia sac. The hernia sac was dissected free of the subcutaneous tissues.  4.3 CM circular  ventralight coated mesh placed into the subfascial position and secured with 1 -0 novafil. Fascia closed with 1 - 0 novafil.   Once the repair was complete the incision was closed by using some 3-0 Vicryl subcutaneous and 4-0 Monocryl subcuticular sutures.  The patient tolerated the procedure well. There were no operative complications. There was minimal  blood loss. All counts were correct. He was taken to the PACU in satisfactory condition.  Erroll Luna, MD, FACS 12/31/2019 12:21 PM

## 2020-01-01 ENCOUNTER — Encounter (HOSPITAL_BASED_OUTPATIENT_CLINIC_OR_DEPARTMENT_OTHER): Payer: Self-pay | Admitting: Surgery

## 2020-05-23 DIAGNOSIS — L42 Pityriasis rosea: Secondary | ICD-10-CM | POA: Diagnosis not present

## 2020-05-23 DIAGNOSIS — D485 Neoplasm of uncertain behavior of skin: Secondary | ICD-10-CM | POA: Diagnosis not present

## 2020-05-23 DIAGNOSIS — D2272 Melanocytic nevi of left lower limb, including hip: Secondary | ICD-10-CM | POA: Diagnosis not present

## 2020-05-23 DIAGNOSIS — Z08 Encounter for follow-up examination after completed treatment for malignant neoplasm: Secondary | ICD-10-CM | POA: Diagnosis not present

## 2020-05-23 DIAGNOSIS — D225 Melanocytic nevi of trunk: Secondary | ICD-10-CM | POA: Diagnosis not present

## 2020-05-23 DIAGNOSIS — Z1283 Encounter for screening for malignant neoplasm of skin: Secondary | ICD-10-CM | POA: Diagnosis not present

## 2020-05-23 DIAGNOSIS — Z85820 Personal history of malignant melanoma of skin: Secondary | ICD-10-CM | POA: Diagnosis not present

## 2020-06-09 DIAGNOSIS — L98429 Non-pressure chronic ulcer of back with unspecified severity: Secondary | ICD-10-CM | POA: Diagnosis not present

## 2020-06-09 DIAGNOSIS — Z1283 Encounter for screening for malignant neoplasm of skin: Secondary | ICD-10-CM | POA: Diagnosis not present

## 2020-06-09 DIAGNOSIS — D485 Neoplasm of uncertain behavior of skin: Secondary | ICD-10-CM | POA: Diagnosis not present

## 2020-09-29 ENCOUNTER — Encounter: Payer: Self-pay | Admitting: Internal Medicine

## 2020-10-19 ENCOUNTER — Ambulatory Visit: Payer: 59 | Admitting: Internal Medicine

## 2020-11-07 ENCOUNTER — Ambulatory Visit: Payer: 59 | Admitting: Internal Medicine

## 2020-12-12 ENCOUNTER — Other Ambulatory Visit: Payer: Self-pay

## 2020-12-12 ENCOUNTER — Ambulatory Visit: Payer: 59 | Admitting: Internal Medicine

## 2020-12-12 ENCOUNTER — Encounter: Payer: Self-pay | Admitting: Internal Medicine

## 2020-12-12 VITALS — BP 152/94 | HR 76 | Temp 98.3°F | Ht 68.0 in | Wt 185.0 lb

## 2020-12-12 DIAGNOSIS — I10 Essential (primary) hypertension: Secondary | ICD-10-CM | POA: Insufficient documentation

## 2020-12-12 DIAGNOSIS — B360 Pityriasis versicolor: Secondary | ICD-10-CM

## 2020-12-12 DIAGNOSIS — Z7689 Persons encountering health services in other specified circumstances: Secondary | ICD-10-CM

## 2020-12-12 DIAGNOSIS — Z114 Encounter for screening for human immunodeficiency virus [HIV]: Secondary | ICD-10-CM | POA: Diagnosis not present

## 2020-12-12 DIAGNOSIS — Z85820 Personal history of malignant melanoma of skin: Secondary | ICD-10-CM

## 2020-12-12 DIAGNOSIS — Z1159 Encounter for screening for other viral diseases: Secondary | ICD-10-CM | POA: Diagnosis not present

## 2020-12-12 DIAGNOSIS — Z0000 Encounter for general adult medical examination without abnormal findings: Secondary | ICD-10-CM | POA: Diagnosis not present

## 2020-12-12 MED ORDER — LISINOPRIL 5 MG PO TABS
5.0000 mg | ORAL_TABLET | Freq: Every day | ORAL | 1 refills | Status: DC
  Filled 2020-12-12: qty 30, 30d supply, fill #0

## 2020-12-12 MED ORDER — KETOCONAZOLE 2 % EX CREA
1.0000 "application " | TOPICAL_CREAM | Freq: Every day | CUTANEOUS | 0 refills | Status: DC
  Filled 2020-12-12: qty 30, 30d supply, fill #0

## 2020-12-12 NOTE — Patient Instructions (Signed)
Please start taking Lisinopril as prescribed.  Please use Ketoconazole cream for skin infection.  Please follow DASH diet and perform moderate exercise/walking at least 150 mins/week.

## 2020-12-12 NOTE — Assessment & Plan Note (Signed)
Care established Previous chart reviewed History and medications reviewed with the patient 

## 2020-12-12 NOTE — Assessment & Plan Note (Signed)
Annual exam as documented. Counseling done  re healthy lifestyle involving commitment to 150 minutes exercise per week, heart healthy diet, and attaining healthy weight.The importance of adequate sleep also discussed. Changes in health habits are decided on by the patient with goals and time frames  set for achieving them. Immunization and cancer screening needs are specifically addressed at this visit. 

## 2020-12-12 NOTE — Assessment & Plan Note (Signed)
Truncal rash likely tinea Started Ketoconazole cream

## 2020-12-12 NOTE — Assessment & Plan Note (Signed)
Followed by Dermatology

## 2020-12-12 NOTE — Progress Notes (Signed)
New Patient Office Visit  Subjective:  Patient ID: Francisco Johns, male    DOB: 07/20/1975  Age: 45 y.o. MRN: [id number]  CC:  Chief Complaint  Patient presents with   Annual Exam   Establish Care    HPI Francisco Johns is a 45 year old male with PMH of melanoma and umbilical hernia s/p repair who presents for establishing care and annual physical.  His BP was elevated in the office today on multiple measurements. He has been having generalized headache and intermittent dizziness. He reports having a head injury at home about a week ago when he tried to get up from sitting position and hit an object. He had a bump on his head, which has almost resolved now. Denies any LOC, tremors, or other neurological deficits. Denies any blurry vision. His wife is a Marine scientist and had evaluated him at that time. He currently denies any chest pain, dyspnea or palpitations.  He reports having a rash over his chest wall and abdominal wall area, which has been itching intensely. He follows up with Dr Nevada Crane for ho melanoma.  He has had 2 doses of COVID vaccine.  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 No pertinent family history.  Social History   Socioeconomic History   Marital status: Married    Spouse name: Not on file   Number of children: Not on  file   Years of education: Not on file   Highest education level: Not on file  Occupational History   Not on file  Tobacco Use   Smoking status: Never   Smokeless tobacco: Never  Vaping Use   Vaping Use: Never used  Substance and Sexual Activity   Alcohol use: No    Comment: social   Drug use: No   Sexual activity: Not on file  Other Topics Concern   Not on file  Social History Narrative   ** Merged History Encounter **       Social Determinants of Health   Financial Resource Strain: Not on file  Food Insecurity: Not on file  Transportation Needs: Not on file  Physical Activity: Not on file  Stress: Not on file  Social Connections: Not on file  Intimate Partner Violence: Not on file    ROS Review of Systems  Constitutional:  Negative for chills and fever.  HENT:  Negative for congestion and sore throat.   Eyes:  Negative for pain and discharge.  Respiratory:  Negative for cough and shortness of breath.   Cardiovascular:  Negative for chest pain and palpitations.  Gastrointestinal:  Negative for constipation, diarrhea, nausea and vomiting.  Endocrine: Negative for polydipsia and polyuria.  Genitourinary:  Negative for dysuria and hematuria.  Musculoskeletal:  Negative for neck pain and neck stiffness.  Skin:  Positive for rash.  Neurological:  Positive for dizziness and headaches. Negative for weakness and numbness.  Psychiatric/Behavioral:  Negative for agitation and behavioral problems.    Objective:   Today's Vitals: BP (!) 152/94 (BP Location: Right Arm, Cuff Size: Normal)   Pulse 76   Temp 98.3 F (36.8 C)   Ht _0  (1.727 m)   Wt 185 lb (83.9 kg)   SpO2 97%   BMI 28.13 kg/m   Physical Exam Vitals reviewed.  Constitutional:      General: He is not in acute distress.    Appearance: He is not diaphoretic.  HENT:     Head: Normocephalic and atraumatic.     Nose: Nose normal.     Mouth/Throat:     Mouth: Mucous membranes are moist.  Eyes:      General: No scleral icterus.    Extraocular Movements: Extraocular movements intact.  Neck:     Vascular: No carotid bruit.  Cardiovascular:     Rate and Rhythm: Normal rate and regular rhythm.     Pulses: Normal pulses.     Heart sounds: Normal heart sounds. No murmur heard. Pulmonary:     Breath sounds: Normal breath sounds. No wheezing or rales.  Abdominal:     Palpations: Abdomen is soft.     Tenderness: There is no abdominal tenderness.  Musculoskeletal:     Cervical back: Neck supple. No tenderness.     Right lower leg: No edema.     Left lower leg: No edema.  Skin:    General: Skin is warm.     Findings: Lesion (Old surgical scar over right forearm, C/D/I) and rash (Erythematous patches, mostly circular - widespread over chest and abdominal wall, and around axilla) present.  Neurological:     General: No focal deficit present.     Mental Status: He is alert and oriented to person, place, and time.     Cranial Nerves: No cranial nerve deficit.     Sensory: No sensory deficit.     Motor: No weakness.  Psychiatric:        Mood and Affect: Mood normal.        Behavior: Behavior normal.    Assessment & Plan:   Problem List Items Addressed This Visit       Annual physical exam - Primary   Annual exam as documented. Counseling done  re healthy lifestyle involving commitment to 150 minutes exercise per week, heart healthy diet, and attaining healthy weight.The importance of adequate sleep also discussed. Changes in health habits are decided on by the patient with goals and time frames  set for achieving them. Immunization and cancer screening needs are specifically addressed at this visit.     Relevant Orders  CBC with Differential/Platelet  CMP14+EGFR  HgB A1c  Lipid panel  TSH  Encounter to establish care   Care established Previous chart reviewed History and medications reviewed with the patient       Cardiovascular and Mediastinum   Essential hypertension     BP Readings from Last 1 Encounters:  12/12/20 (!) 152/94  New-onset Started Lisinopril 5 mg QD Counseled for compliance with the medications Advised DASH diet and moderate exercise/walking, at least 150 mins/week       Relevant Medications   lisinopril (ZESTRIL) 5 MG tablet     Musculoskeletal and Integument   Tinea versicolor    Truncal rash likely tinea Started Ketoconazole cream      Relevant Medications   ketoconazole (NIZORAL) 2 % cream  Other      History of melanoma    Followed by Dermatology      Other Visit Diagnoses     Encounter for screening for HIV       Relevant Orders   HIV antibody (with reflex)   Need for hepatitis C screening test       Relevant Orders   Hepatitis C Antibody       Outpatient Encounter Medications as of 12/12/2020  Medication Sig   ketoconazole (NIZORAL) 2 % cream Apply 1 application topically daily.   lisinopril (ZESTRIL) 5 MG tablet Take 1 tablet (5 mg total) by mouth daily.   [DISCONTINUED] aspirin EC 81 MG tablet Take 1 tablet (81 mg total) by mouth daily. (Patient not taking: Reported on 10/18/2017)   [DISCONTINUED] docusate sodium (COLACE) 100 MG capsule Take 1 capsule (100 mg total) by mouth 2 (two) times daily. To prevent constipation while taking pain medication. (Patient not taking: Reported on 10/18/2017)   [DISCONTINUED] ibuprofen (ADVIL) 800 MG tablet Take 1 tablet (800 mg total) by mouth every 8 (eight) hours as needed.   [DISCONTINUED] methocarbamol (ROBAXIN) 500 MG tablet Take 1 tablet (500 mg total) by mouth every 6 (six) hours as needed for muscle spasms. (Patient not taking: Reported on 10/18/2017)   [DISCONTINUED] nitroGLYCERIN (NITRODUR - DOSED IN MG/24 HR) 0.1 mg/hr patch Place 0.1 mg onto the skin daily.   [DISCONTINUED] omeprazole (PRILOSEC) 20 MG capsule Take 1 capsule (20 mg total) by mouth daily. While taking anti inflammatory medicine daily (Patient not taking: Reported on 10/18/2017)   [DISCONTINUED]  ondansetron (ZOFRAN) 4 MG tablet Take 1 tablet (4 mg total) by mouth every 8 (eight) hours as needed for nausea or vomiting. (Patient not taking: Reported on 10/18/2017)   [DISCONTINUED] oxyCODONE (OXY IR/ROXICODONE) 5 MG immediate release tablet Take 1 tablet (5 mg total) by mouth every 6 (six) hours as needed for severe pain.   [DISCONTINUED] oxyCODONE-acetaminophen (ROXICET) 5-325 MG tablet Take 1-2 tablets by mouth every 4 (four) hours as needed for severe pain. (Patient not taking: Reported on 10/18/2017)   No facility-administered encounter medications on file as of 12/12/2020.    Follow-up: Return in about 6 weeks (around 01/23/2021) for HTN and tinea.   Lindell Spar, MD

## 2020-12-12 NOTE — Assessment & Plan Note (Signed)
BP Readings from Last 1 Encounters:  12/12/20 (!) 152/94   New-onset Started Lisinopril 5 mg QD Counseled for compliance with the medications Advised DASH diet and moderate exercise/walking, at least 150 mins/week

## 2020-12-13 ENCOUNTER — Other Ambulatory Visit: Payer: Self-pay

## 2020-12-13 DIAGNOSIS — Z0000 Encounter for general adult medical examination without abnormal findings: Secondary | ICD-10-CM | POA: Diagnosis not present

## 2020-12-13 DIAGNOSIS — Z114 Encounter for screening for human immunodeficiency virus [HIV]: Secondary | ICD-10-CM | POA: Diagnosis not present

## 2020-12-13 DIAGNOSIS — Z1159 Encounter for screening for other viral diseases: Secondary | ICD-10-CM | POA: Diagnosis not present

## 2020-12-13 MED ORDER — OMRON 3 SERIES BP MONITOR DEVI
0 refills | Status: DC
  Filled 2020-12-13: qty 1, 1d supply, fill #0

## 2020-12-14 ENCOUNTER — Other Ambulatory Visit: Payer: Self-pay

## 2020-12-14 ENCOUNTER — Encounter: Payer: Self-pay | Admitting: Internal Medicine

## 2020-12-14 ENCOUNTER — Other Ambulatory Visit (HOSPITAL_COMMUNITY): Payer: Self-pay

## 2020-12-14 ENCOUNTER — Ambulatory Visit (AMBULATORY_SURGERY_CENTER): Payer: Self-pay | Admitting: *Deleted

## 2020-12-14 VITALS — Ht 68.0 in | Wt 183.6 lb

## 2020-12-14 DIAGNOSIS — Z1211 Encounter for screening for malignant neoplasm of colon: Secondary | ICD-10-CM

## 2020-12-14 LAB — CMP14+EGFR
ALT: 42 IU/L (ref 0–44)
AST: 28 IU/L (ref 0–40)
Albumin/Globulin Ratio: 2.3 — ABNORMAL HIGH (ref 1.2–2.2)
Albumin: 4.9 g/dL (ref 4.0–5.0)
Alkaline Phosphatase: 76 IU/L (ref 44–121)
BUN/Creatinine Ratio: 14 (ref 9–20)
BUN: 15 mg/dL (ref 6–24)
Bilirubin Total: 0.5 mg/dL (ref 0.0–1.2)
CO2: 23 mmol/L (ref 20–29)
Calcium: 9.6 mg/dL (ref 8.7–10.2)
Chloride: 101 mmol/L (ref 96–106)
Creatinine, Ser: 1.09 mg/dL (ref 0.76–1.27)
Globulin, Total: 2.1 g/dL (ref 1.5–4.5)
Glucose: 98 mg/dL (ref 65–99)
Potassium: 4.3 mmol/L (ref 3.5–5.2)
Sodium: 138 mmol/L (ref 134–144)
Total Protein: 7 g/dL (ref 6.0–8.5)
eGFR: 85 mL/min/1.73

## 2020-12-14 LAB — CBC WITH DIFFERENTIAL/PLATELET
Basophils Absolute: 0 x10E3/uL (ref 0.0–0.2)
Basos: 1 %
EOS (ABSOLUTE): 0.2 x10E3/uL (ref 0.0–0.4)
Eos: 3 %
Hematocrit: 41.4 % (ref 37.5–51.0)
Hemoglobin: 13.9 g/dL (ref 13.0–17.7)
Immature Grans (Abs): 0 x10E3/uL (ref 0.0–0.1)
Immature Granulocytes: 0 %
Lymphocytes Absolute: 1.8 x10E3/uL (ref 0.7–3.1)
Lymphs: 41 %
MCH: 30.6 pg (ref 26.6–33.0)
MCHC: 33.6 g/dL (ref 31.5–35.7)
MCV: 91 fL (ref 79–97)
Monocytes Absolute: 0.5 x10E3/uL (ref 0.1–0.9)
Monocytes: 10 %
Neutrophils Absolute: 2 x10E3/uL (ref 1.4–7.0)
Neutrophils: 45 %
Platelets: 216 x10E3/uL (ref 150–450)
RBC: 4.54 x10E6/uL (ref 4.14–5.80)
RDW: 12.6 % (ref 11.6–15.4)
WBC: 4.5 x10E3/uL (ref 3.4–10.8)

## 2020-12-14 LAB — HEMOGLOBIN A1C
Est. average glucose Bld gHb Est-mCnc: 114 mg/dL
Hgb A1c MFr Bld: 5.6 % (ref 4.8–5.6)

## 2020-12-14 LAB — LIPID PANEL
Chol/HDL Ratio: 5 ratio (ref 0.0–5.0)
Cholesterol, Total: 224 mg/dL — ABNORMAL HIGH (ref 100–199)
HDL: 45 mg/dL
LDL Chol Calc (NIH): 160 mg/dL — ABNORMAL HIGH (ref 0–99)
Triglycerides: 106 mg/dL (ref 0–149)
VLDL Cholesterol Cal: 19 mg/dL (ref 5–40)

## 2020-12-14 LAB — HEPATITIS C ANTIBODY: Hep C Virus Ab: <0.1 s/co ratio (ref 0.0–0.9)

## 2020-12-14 LAB — HIV ANTIBODY (ROUTINE TESTING W REFLEX): HIV Screen 4th Generation wRfx: NONREACTIVE

## 2020-12-14 LAB — TSH: TSH: 2.63 u[IU]/mL (ref 0.450–4.500)

## 2020-12-14 MED ORDER — NA SULFATE-K SULFATE-MG SULF 17.5-3.13-1.6 GM/177ML PO SOLN
1.0000 | Freq: Once | ORAL | 0 refills | Status: AC
  Filled 2020-12-14 (×2): qty 354, 1d supply, fill #0

## 2020-12-14 NOTE — Progress Notes (Signed)
  No trouble with anesthesia, denies being told they were difficult to intubate, or hx/fam hx of malignant hyperthermia per pt    No egg or soy allergy  No home oxygen use   No medications for weight loss taken  emmi information given  Pt denies constipation issues  Pt informed that we do not do prior authorizations for prep  

## 2020-12-19 DIAGNOSIS — H5213 Myopia, bilateral: Secondary | ICD-10-CM | POA: Diagnosis not present

## 2020-12-19 DIAGNOSIS — H524 Presbyopia: Secondary | ICD-10-CM | POA: Diagnosis not present

## 2020-12-19 DIAGNOSIS — H52223 Regular astigmatism, bilateral: Secondary | ICD-10-CM | POA: Diagnosis not present

## 2020-12-22 DIAGNOSIS — Z85820 Personal history of malignant melanoma of skin: Secondary | ICD-10-CM | POA: Diagnosis not present

## 2020-12-22 DIAGNOSIS — D225 Melanocytic nevi of trunk: Secondary | ICD-10-CM | POA: Diagnosis not present

## 2020-12-22 DIAGNOSIS — B360 Pityriasis versicolor: Secondary | ICD-10-CM | POA: Diagnosis not present

## 2020-12-22 DIAGNOSIS — Z1283 Encounter for screening for malignant neoplasm of skin: Secondary | ICD-10-CM | POA: Diagnosis not present

## 2020-12-22 DIAGNOSIS — Z08 Encounter for follow-up examination after completed treatment for malignant neoplasm: Secondary | ICD-10-CM | POA: Diagnosis not present

## 2020-12-28 ENCOUNTER — Encounter: Payer: Self-pay | Admitting: Internal Medicine

## 2020-12-28 ENCOUNTER — Other Ambulatory Visit: Payer: Self-pay

## 2020-12-28 ENCOUNTER — Ambulatory Visit (AMBULATORY_SURGERY_CENTER): Payer: 59 | Admitting: Internal Medicine

## 2020-12-28 VITALS — BP 123/83 | HR 66 | Temp 98.6°F | Resp 15 | Ht 68.0 in | Wt 183.6 lb

## 2020-12-28 DIAGNOSIS — Z1211 Encounter for screening for malignant neoplasm of colon: Secondary | ICD-10-CM | POA: Diagnosis not present

## 2020-12-28 MED ORDER — SODIUM CHLORIDE 0.9 % IV SOLN: 500.0000 mL | Freq: Once | INTRAVENOUS | Status: DC

## 2020-12-28 NOTE — Progress Notes (Signed)
GASTROENTEROLOGY PROCEDURE H&P NOTE   Primary Care Physician: Lindell Spar, MD    Reason for Procedure:  Colon cancer screening  Plan:    Colonoscopy  Patient is appropriate for endoscopic procedure(s) in the ambulatory ([facility]) setting.  The nature of the procedure, as well as the risks, benefits, and alternatives were carefully and thoroughly reviewed with the patient. Ample time for discussion and questions allowed. The patient understood, was satisfied, and agreed to proceed.     HPI: Francisco Johns is a 45 y.o. male who presents for screening colonoscopy.  Medical history as below.  No complaints today including chest pain, shortness of breath or abdominal pain.  Tolerated the prep.  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 Authorizing Provider  Blood Pressure Monitoring (OMRON 3 SERIES BP MONITOR) DEVI use as directed to monitor blood pressure 12/13/20  Yes Nicks, Ewing Schlein, RPH  ketoconazole (NIZORAL) 2 % cream Apply 1 application topically daily. 12/12/20  Yes Lindell Spar, MD  lisinopril (ZESTRIL) 5 MG tablet Take 1 tablet (5 mg total) by mouth daily. 12/12/20  Yes Lindell Spar, MD    Current  Outpatient Medications  Medication Sig Dispense Refill   Blood Pressure Monitoring (OMRON 3 SERIES BP MONITOR) DEVI use as directed to monitor blood pressure 1 each 0   ketoconazole (NIZORAL) 2 % cream Apply 1 application topically daily. 30 g 0   lisinopril (ZESTRIL) 5 MG tablet Take 1 tablet (5 mg total) by mouth daily. 30 tablet 1   Current Facility-Administered Medications  Medication Dose Route Frequency Provider Last Rate Last Admin   0.9 %  sodium chloride infusion  500 mL Intravenous Once Jayvien Rowlette, Lajuan Lines, MD        Allergies as of 12/28/2020   (No Known Allergies)    Family History  Problem Relation Age of Onset   Colon cancer Neg Hx    Esophageal cancer Neg Hx    Rectal cancer Neg Hx    Stomach cancer Neg Hx     Social History   Socioeconomic History   Marital status: Married    Spouse name: Not on file   Number of children: Not on file   Years of education: Not on file   Highest education level: Not on file  Occupational History   Not on file  Tobacco Use   Smoking status: Never   Smokeless tobacco: Never  Vaping Use   Vaping Use: Never used  Substance and Sexual Activity   Alcohol use: No    Comment: social   Drug use: No   Sexual activity: Not on file  Other Topics Concern   Not on file  Social History Narrative   ** Merged History Encounter **       Social Determinants of Health   Financial Resource Strain: Not on file  Food Insecurity: Not on file  Transportation Needs: Not on file  Physical Activity: Not on file  Stress: Not on file  Social Connections: Not on file  Intimate Partner Violence: Not on file    Physical Exam: Vital signs in last 24 hours: '@BP'$  123/87   Pulse 67   Temp 98.6 F (37 C) (Temporal)   Ht '5\' 8"'$  (1.727 m)   Wt 183 lb 9.6 oz (83.3 kg)   SpO2 100%   BMI 27.92 kg/m  GEN: NAD EYE: Sclerae anicteric ENT: MMM CV: Non-tachycardic Pulm: CTA b/l GI: Soft, NT/ND NEURO:  Alert & Oriented x 3   Zenovia Jarred,  MD [facility]  12/28/2020 10:35 AM

## 2020-12-28 NOTE — Progress Notes (Signed)
VS completed by DT.  Pt's states no medical or surgical changes since previsit or office visit.  No BP/IV sticks on right side

## 2020-12-28 NOTE — Op Note (Signed)
[place] Patient Name: Francisco Johns Procedure Date: 12/28/2020 10:21 AM MRN: [id number] Endoscopist: Jerene Bears , MD Age: 45 Referring MD:  Date of Birth: Nov 21, 1975 Gender: Male Account #: [id number] Procedure:                Colonoscopy Indications:              Screening for colorectal malignant neoplasm, This                            is the patient's first colonoscopy Medicines:                Monitored Anesthesia Care Procedure:                Pre-Anesthesia Assessment:                           - Prior to the procedure, a History and Physical                            was performed, and patient medications and                            allergies were reviewed. The patient's tolerance of                            previous anesthesia was also reviewed. The risks                            and benefits of the procedure and the sedation                            options and risks were discussed with the patient.                            All questions were answered, and informed consent                            was obtained. Prior Anticoagulants: The patient has                            taken no previous anticoagulant or antiplatelet                            agents. ASA Grade Assessment: II - A patient with                            mild systemic disease. After reviewing the risks                            and benefits, the patient was deemed in                            satisfactory condition to undergo the procedure.  After obtaining informed consent, the colonoscope                            was passed under direct vision. Throughout the                            procedure, the patient's blood pressure, pulse, and                            oxygen saturations were monitored continuously. The                            CF [id number] was introduced through the anus                            and advanced to the cecum,  identified by                            appendiceal orifice and ileocecal valve. The                            colonoscopy was performed without difficulty. The                            patient tolerated the procedure well. The quality                            of the bowel preparation was good. The ileocecal                            valve, appendiceal orifice, and rectum were                            photographed. Scope In: 10:44:49 AM Scope Out: 10:57:01 AM Scope Withdrawal Time: 0 hours 9 minutes 35 seconds  Total Procedure Duration: 0 hours 12 minutes 12 seconds  Findings:                 The perianal and digital rectal examinations were                            normal.                           A few small-mouthed diverticula were found in the                            hepatic flexure.                           The exam was otherwise without abnormality on                            direct and retroflexion views. Complications:            No immediate complications. Estimated Blood Loss:  Estimated blood loss: none. Impression:               - Diverticulosis at the hepatic flexure.                           - The examination was otherwise normal on direct                            and retroflexion views.                           - No specimens collected. Recommendation:           - Patient has a contact number available for                            emergencies. The signs and symptoms of potential                            delayed complications were discussed with the                            patient. Return to normal activities tomorrow.                            Written discharge instructions were provided to the                            patient.                           - Resume previous diet.                           - Continue present medications.                           - Repeat colonoscopy in 10 years for screening                             purposes. Jerene Bears, MD 12/28/2020 11:00:26 AM This report has been signed electronically.

## 2020-12-28 NOTE — Patient Instructions (Signed)
Handout on diverticulosis given.   YOU HAD AN ENDOSCOPIC PROCEDURE TODAY AT THE [facility]:   Refer to the procedure report that was given to you for any specific questions about what was found during the examination.  If the procedure report does not answer your questions, please call your gastroenterologist to clarify.  If you requested that your care partner not be given the details of your procedure findings, then the procedure report has been included in a sealed envelope for you to review at your convenience later.  YOU SHOULD EXPECT: Some feelings of bloating in the abdomen. Passage of more gas than usual.  Walking can help get rid of the air that was put into your GI tract during the procedure and reduce the bloating. If you had a lower endoscopy (such as a colonoscopy or flexible sigmoidoscopy) you may notice spotting of blood in your stool or on the toilet paper. If you underwent a bowel prep for your procedure, you may not have a normal bowel movement for a few days.  Please Note:  You might notice some irritation and congestion in your nose or some drainage.  This is from the oxygen used during your procedure.  There is no need for concern and it should clear up in a day or so.  SYMPTOMS TO REPORT IMMEDIATELY:   Following lower endoscopy (colonoscopy or flexible sigmoidoscopy):  Excessive amounts of blood in the stool  Significant tenderness or worsening of abdominal pains  Swelling of the abdomen that is new, acute  Fever of 100F or higher   For urgent or emergent issues, a gastroenterologist can be reached at any hour by calling [phone]. Do not use MyChart messaging for urgent concerns.    DIET:  We do recommend a small meal at first, but then you may proceed to your regular diet.  Drink plenty of fluids but you should avoid alcoholic beverages for 24 hours.  ACTIVITY:  You should plan to take it easy for the rest of today and you should NOT DRIVE or use  heavy machinery until tomorrow (because of the sedation medicines used during the test).    FOLLOW UP: Our staff will call the number listed on your records 48-72 hours following your procedure to check on you and address any questions or concerns that you may have regarding the information given to you following your procedure. If we do not reach you, we will leave a message.  We will attempt to reach you two times.  During this call, we will ask if you have developed any symptoms of COVID 19. If you develop any symptoms (ie: fever, flu-like symptoms, shortness of breath, cough etc.) before then, please call [phone].  If you test positive for Covid 19 in the 2 weeks post procedure, please call and report this information to us.    If any biopsies were taken you will be contacted by phone or by letter within the next 1-3 weeks.  Please call us at [phone] if you have not heard about the biopsies in 3 weeks.    SIGNATURES/CONFIDENTIALITY: You and/or your care partner have signed paperwork which will be entered into your electronic medical record.  These signatures attest to the fact that that the information above on your After Visit Summary has been reviewed and is understood.  Full responsibility of the confidentiality of this discharge information lies with you and/or your care-partner. 

## 2020-12-28 NOTE — Progress Notes (Signed)
A/ox3, pleased with MAC, report to RN 

## 2020-12-30 ENCOUNTER — Telehealth: Payer: Self-pay

## 2020-12-30 NOTE — Telephone Encounter (Signed)
Attempted to reach patient for post-procedure f/u call. NO answer. Left message for him to please not hesitate to call us if he has any questions/concerns regardfing his care.

## 2020-12-30 NOTE — Telephone Encounter (Signed)
Called [phone] and left a message we tried to reach pt for a follow up call. maw

## 2020-12-30 NOTE — Telephone Encounter (Signed)
Attempted to reach pt. With follow-up call following endoscopic procedure 12/28/2020.   LM on pt. Voice mail to call if he has any questions or concerns.

## 2021-01-25 ENCOUNTER — Encounter: Payer: Self-pay | Admitting: Internal Medicine

## 2021-01-25 ENCOUNTER — Ambulatory Visit: Payer: 59 | Admitting: Internal Medicine

## 2021-01-25 ENCOUNTER — Other Ambulatory Visit: Payer: Self-pay

## 2021-01-25 VITALS — BP 138/80 | HR 72 | Temp 98.1°F | Resp 18 | Ht 68.0 in | Wt 180.1 lb

## 2021-01-25 DIAGNOSIS — B360 Pityriasis versicolor: Secondary | ICD-10-CM

## 2021-01-25 DIAGNOSIS — E782 Mixed hyperlipidemia: Secondary | ICD-10-CM

## 2021-01-25 DIAGNOSIS — I10 Essential (primary) hypertension: Secondary | ICD-10-CM

## 2021-01-25 MED ORDER — ROSUVASTATIN CALCIUM 10 MG PO TABS
10.0000 mg | ORAL_TABLET | Freq: Every day | ORAL | 3 refills | Status: DC
  Filled 2021-01-25: qty 90, 90d supply, fill #0
  Filled 2021-04-25: qty 90, 90d supply, fill #1
  Filled 2021-07-28: qty 90, 90d supply, fill #2
  Filled 2021-11-03: qty 90, 90d supply, fill #3

## 2021-01-25 MED ORDER — LISINOPRIL 5 MG PO TABS
5.0000 mg | ORAL_TABLET | Freq: Every day | ORAL | 1 refills | Status: DC
  Filled 2021-01-25: qty 90, 90d supply, fill #0
  Filled 2021-04-25: qty 90, 90d supply, fill #1

## 2021-01-25 NOTE — Assessment & Plan Note (Signed)
BP Readings from Last 1 Encounters:  01/25/21 138/80   Well-controlled with Lisinopril 5 mg QD, missed few doses Counseled for compliance with the medications Advised DASH diet and moderate exercise/walking, at least 150 mins/week

## 2021-01-25 NOTE — Assessment & Plan Note (Signed)
Lipid profile reviewed Started Crestor 10 mg daily

## 2021-01-25 NOTE — Progress Notes (Signed)
Established Patient Office Visit  Subjective:  Patient ID: Francisco Johns, male    DOB: 01-07-1976  Age: 46 y.o. MRN: [id number]  CC:  Chief Complaint  Patient presents with   Follow-up    6 week follow up     HPI Francisco Johns  is a 45 year old male with PMH of HTN, HLD, melanoma and umbilical hernia s/p repair who presents for follow up of HTN.  HTN: BP is well-controlled. Takes medication, but missed few doses of lisinopril. Patient denies headache, dizziness, chest pain, dyspnea or palpitations.  His dizziness and headache have improved since starting lisinopril.  Blood tests were reviewed and discussed with the patient in detail.  He agrees to take Crestor for hyperlipidemia.  He had colonoscopy recently, which was normal.  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,R  Tenolysis and 5th FDS repair 9/70/26     UMBILICAL HERNIA REPAIR N/A 12/31/2019   Procedure: HERNIA REPAIR UMBILICAL WITH MESH;  Surgeon: Erroll Luna, MD;  Location: [facility];  Service: General;  Laterality: N/A;    Family History  Problem Relation Age of Onset   Colon cancer Neg Hx    Esophageal cancer Neg Hx    Rectal cancer Neg Hx    Stomach cancer Neg Hx     Social History   Socioeconomic History   Marital status: Married    Spouse name: Not on file   Number of children: Not on file   Years of education: Not on file   Highest education level: Not on file  Occupational History   Not on file  Tobacco Use    Smoking status: Never   Smokeless tobacco: Never  Vaping Use   Vaping Use: Never used  Substance and Sexual Activity   Alcohol use: No    Comment: social   Drug use: No   Sexual activity: Not on file  Other Topics Concern   Not on file  Social History Narrative   ** Merged History Encounter **       Social Determinants of Health   Financial Resource Strain: Not on file  Food Insecurity: Not on file  Transportation Needs: Not on file  Physical Activity: Not on file  Stress: Not on file  Social Connections: Not on file  Intimate Partner Violence: Not on file    Outpatient Medications Prior to Visit  Medication Sig Dispense Refill   Blood Pressure Monitoring (OMRON 3 SERIES BP MONITOR) DEVI use as directed to monitor blood pressure 1 each 0   ketoconazole (NIZORAL) 2 % cream Apply 1 application topically daily. 30 g 0   lisinopril (ZESTRIL) 5 MG tablet Take 1 tablet (5 mg total) by mouth daily. (Patient not taking: Reported on 01/25/2021) 30 tablet 1   No facility-administered medications prior to visit.    No Known Allergies  ROS Review of Systems  Constitutional:  Negative for chills and fever.  HENT:  Negative for congestion and sore throat.   Eyes:  Negative for  pain and discharge.  Respiratory:  Negative for cough and shortness of breath.   Cardiovascular:  Negative for chest pain and palpitations.  Gastrointestinal:  Negative for constipation, diarrhea, nausea and vomiting.  Endocrine: Negative for polydipsia and polyuria.  Genitourinary:  Negative for dysuria and hematuria.  Musculoskeletal:  Negative for neck pain and neck stiffness.  Skin:  Positive for rash.  Neurological:  Negative for dizziness, weakness, numbness and headaches.  Psychiatric/Behavioral:  Negative for agitation and behavioral problems.      Objective:    Physical Exam Vitals reviewed.  Constitutional:      General: He is not in acute distress.    Appearance: He is not diaphoretic.   HENT:     Head: Normocephalic and atraumatic.     Nose: Nose normal.     Mouth/Throat:     Mouth: Mucous membranes are moist.  Eyes:     General: No scleral icterus.    Extraocular Movements: Extraocular movements intact.  Neck:     Vascular: No carotid bruit.  Cardiovascular:     Rate and Rhythm: Normal rate and regular rhythm.     Pulses: Normal pulses.     Heart sounds: Normal heart sounds. No murmur heard. Pulmonary:     Breath sounds: Normal breath sounds. No wheezing or rales.  Abdominal:     Palpations: Abdomen is soft.     Tenderness: There is no abdominal tenderness.  Musculoskeletal:     Cervical back: Neck supple. No tenderness.     Right lower leg: No edema.     Left lower leg: No edema.  Skin:    General: Skin is warm.     Findings: Lesion (Old surgical scar over right forearm, C/D/I) and rash (Erythematous patches, mostly circular - widespread over chest and abdominal wall, and around axilla -improved from prior) present.  Neurological:     General: No focal deficit present.     Mental Status: He is alert and oriented to person, place, and time.     Cranial Nerves: No cranial nerve deficit.     Sensory: No sensory deficit.     Motor: No weakness.  Psychiatric:        Mood and Affect: Mood normal.        Behavior: Behavior normal.    BP 138/80 (BP Location: Right Arm, Cuff Size: Normal)   Pulse 72   Temp 98.1 F (36.7 C) (Oral)   Resp 18   Ht 5' 8" (1.727 m)   Wt 180 lb 1.9 oz (81.7 kg)   SpO2 98%   BMI 27.39 kg/m  Wt Readings from Last 3 Encounters:  01/25/21 180 lb 1.9 oz (81.7 kg)  12/28/20 183 lb 9.6 oz (83.3 kg)  12/14/20 183 lb 9.6 oz (83.3 kg)     There are no preventive care reminders to display for this patient.  There are no preventive care reminders to display for this patient.  Lab Results  Component Value Date   TSH 2.630 12/13/2020   Lab Results  Component Value Date   WBC 4.5 12/13/2020   HGB 13.9 12/13/2020   HCT 41.4  12/13/2020   MCV 91 12/13/2020   PLT 216 12/13/2020   Lab Results  Component Value Date   NA 138 12/13/2020   K 4.3 12/13/2020   CO2 23 12/13/2020   GLUCOSE 98 12/13/2020   BUN 15 12/13/2020   CREATININE 1.09 12/13/2020   BILITOT 0.5 12/13/2020   ALKPHOS 76 12/13/2020     AST 28 12/13/2020   ALT 42 12/13/2020   PROT 7.0 12/13/2020   ALBUMIN 4.9 12/13/2020   CALCIUM 9.6 12/13/2020   EGFR 85 12/13/2020   Lab Results  Component Value Date   CHOL 224 (H) 12/13/2020   Lab Results  Component Value Date   HDL 45 12/13/2020   Lab Results  Component Value Date   LDLCALC 160 (H) 12/13/2020   Lab Results  Component Value Date   TRIG 106 12/13/2020   Lab Results  Component Value Date   CHOLHDL 5.0 12/13/2020   Lab Results  Component Value Date   HGBA1C 5.6 12/13/2020      Assessment & Plan:   Problem List Items Addressed This Visit       Cardiovascular and Mediastinum   Essential hypertension - Primary    BP Readings from Last 1 Encounters:  01/25/21 138/80  Well-controlled with Lisinopril 5 mg QD, missed few doses Counseled for compliance with the medications Advised DASH diet and moderate exercise/walking, at least 150 mins/week      Relevant Medications   lisinopril (ZESTRIL) 5 MG tablet   rosuvastatin (CRESTOR) 10 MG tablet   Other Relevant Orders   Basic Metabolic Panel (BMET)     Musculoskeletal and Integument   Tinea versicolor    Improved with ketoconazole cream        Other   Mixed hyperlipidemia    Lipid profile reviewed Started Crestor 10 mg daily      Relevant Medications   lisinopril (ZESTRIL) 5 MG tablet   rosuvastatin (CRESTOR) 10 MG tablet   Other Relevant Orders   Lipid Profile    Meds ordered this encounter  Medications   lisinopril (ZESTRIL) 5 MG tablet    Sig: Take 1 tablet (5 mg total) by mouth daily.    Dispense:  90 tablet    Refill:  1   rosuvastatin (CRESTOR) 10 MG tablet    Sig: Take 1 tablet (10 mg total) by  mouth daily.    Dispense:  90 tablet    Refill:  3    Follow-up: Return in about 6 months (around 07/26/2021) for HTN and HLD.    Lindell Spar, MD

## 2021-01-25 NOTE — Assessment & Plan Note (Signed)
Improved with ketoconazole cream

## 2021-01-25 NOTE — Patient Instructions (Signed)
Please start taking Crestor for cholesterol.  Continue to take Lisinopril.  Please continue to follow low salt diet and perform moderate exercise/walking at least 150 mins/week.

## 2021-04-25 ENCOUNTER — Other Ambulatory Visit: Payer: Self-pay

## 2021-07-24 DIAGNOSIS — E782 Mixed hyperlipidemia: Secondary | ICD-10-CM | POA: Diagnosis not present

## 2021-07-24 DIAGNOSIS — I10 Essential (primary) hypertension: Secondary | ICD-10-CM | POA: Diagnosis not present

## 2021-07-25 LAB — LIPID PANEL
Chol/HDL Ratio: 2.5 ratio (ref 0.0–5.0)
Cholesterol, Total: 152 mg/dL (ref 100–199)
HDL: 61 mg/dL (ref >39)
LDL Chol Calc (NIH): 80 mg/dL (ref 0–99)
Triglycerides: 54 mg/dL (ref 0–149)
VLDL Cholesterol Cal: 11 mg/dL (ref 5–40)

## 2021-07-25 LAB — BASIC METABOLIC PANEL
BUN/Creatinine Ratio: 17 (ref 9–20)
BUN: 16 mg/dL (ref 6–24)
CO2: 24 mmol/L (ref 20–29)
Calcium: 9.5 mg/dL (ref 8.7–10.2)
Chloride: 102 mmol/L (ref 96–106)
Creatinine, Ser: 0.95 mg/dL (ref 0.76–1.27)
Glucose: 102 mg/dL — ABNORMAL HIGH (ref 70–99)
Potassium: 4.5 mmol/L (ref 3.5–5.2)
Sodium: 138 mmol/L (ref 134–144)
eGFR: 101 mL/min/{1.73_m2} (ref >59)

## 2021-07-26 ENCOUNTER — Other Ambulatory Visit (HOSPITAL_COMMUNITY): Payer: Self-pay

## 2021-07-26 ENCOUNTER — Ambulatory Visit: Payer: 59 | Admitting: Internal Medicine

## 2021-07-26 ENCOUNTER — Encounter: Payer: Self-pay | Admitting: Internal Medicine

## 2021-07-26 VITALS — BP 138/88 | HR 83 | Resp 16 | Ht 68.0 in | Wt 184.0 lb

## 2021-07-26 DIAGNOSIS — I10 Essential (primary) hypertension: Secondary | ICD-10-CM | POA: Diagnosis not present

## 2021-07-26 DIAGNOSIS — B360 Pityriasis versicolor: Secondary | ICD-10-CM

## 2021-07-26 DIAGNOSIS — E559 Vitamin D deficiency, unspecified: Secondary | ICD-10-CM | POA: Diagnosis not present

## 2021-07-26 DIAGNOSIS — E782 Mixed hyperlipidemia: Secondary | ICD-10-CM | POA: Diagnosis not present

## 2021-07-26 DIAGNOSIS — R7303 Prediabetes: Secondary | ICD-10-CM | POA: Diagnosis not present

## 2021-07-26 MED ORDER — LISINOPRIL 5 MG PO TABS
5.0000 mg | ORAL_TABLET | Freq: Every day | ORAL | 1 refills | Status: DC
  Filled 2021-07-28: qty 90, 90d supply, fill #0
  Filled 2021-11-03: qty 90, 90d supply, fill #1

## 2021-07-26 MED ORDER — ITRACONAZOLE 100 MG PO CAPS
200.0000 mg | ORAL_CAPSULE | Freq: Every day | ORAL | 0 refills | Status: DC
  Filled 2021-07-26: qty 10, 5d supply, fill #0

## 2021-07-26 MED ORDER — LISINOPRIL 5 MG PO TABS
5.0000 mg | ORAL_TABLET | Freq: Every day | ORAL | 1 refills | Status: DC
  Filled 2021-07-26: qty 90, 90d supply, fill #0

## 2021-07-26 MED ORDER — ITRACONAZOLE 100 MG PO CAPS: 200.0000 mg | ORAL_CAPSULE | Freq: Every day | ORAL | 0 refills | Status: DC

## 2021-07-26 NOTE — Progress Notes (Signed)
? ?Established Patient Office Visit ? ?Subjective:  ?Patient ID: Francisco Johns, male    DOB: 05/29/1975  Age: 46 y.o. MRN: [id number] ? ?CC:  ?Chief Complaint  ?Patient presents with  ? Follow-up  ?  6 month follow up HTN and HLD   ? ? ?HPI ?Francisco Johns is a 46 y.o. male with past medical history of HTN, HLD, melanoma and umbilical hernia s/p repair who presents for f/u of his chronic medical conditions. ? ?HTN: BP is well-controlled. Takes medications regularly. Patient denies headache, dizziness, chest pain, dyspnea or palpitations. ? ?His cholesterol profile is better with Crestor now. ? ?He had to use ketoconazole cream twice since the last visit for tinea infection, which has improved now. ? ? ?Past Medical History:  ?Diagnosis Date  ? Blood transfusion without reported diagnosis   ? Hypertension   ? Metatarsal fracture   ? right big toe  ? ? ?Past Surgical History:  ?Procedure Laterality Date  ? ankle surgery    ? ORIF TOE FRACTURE Right 12/09/2015  ? Procedure: OPEN REDUCTION INTERNAL FIXATION (ORIF) METATARSAL (TOE) FRACTURE, first metatarsal;  Surgeon: Renette Butters, MD;  Location: [place];  Service: Orthopedics;  Laterality: Right;  ? 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 UMBILICAL HERNIA REPAIR N/A 12/31/2019  ? Procedure: HERNIA REPAIR UMBILICAL WITH MESH;  Surgeon: Erroll Luna, MD;  Location: [place];  Service: General;  Laterality: N/A;  ? ? ?Family History  ?Problem Relation Age of Onset  ? Colon cancer Neg Hx   ? Esophageal cancer Neg Hx   ? Rectal cancer Neg Hx   ? Stomach cancer Neg Hx   ? ? ?Social History  ? ?Socioeconomic History  ? Marital status: Married  ?  Spouse name: Not on file  ? Number of children: Not on file  ? Years of education: Not on file  ? Highest education level: Not on file  ?Occupational History  ? Not on file  ?Tobacco Use  ? Smoking status: Never  ? Smokeless tobacco: Never   ?Vaping Use  ? Vaping Use: Never used  ?Substance and Sexual Activity  ? Alcohol use: No  ?  Comment: social  ? Drug use: No  ? Sexual activity: Not on file  ?Other Topics Concern  ? Not on file  ?Social History Narrative  ? ** Merged History Encounter **  ?    ? ?Social Determinants of Health  ? ?Financial Resource Strain: Not on file  ?Food Insecurity: Not on file  ?Transportation Needs: Not on file  ?Physical Activity: Not on file  ?Stress: Not on file  ?Social Connections: Not on file  ?Intimate Partner Violence: Not on file  ? ? ?Outpatient Medications Prior to Visit  ?Medication Sig Dispense Refill  ? Blood Pressure Monitoring (OMRON 3 SERIES BP MONITOR) DEVI use as directed to monitor blood pressure 1 each 0  ? ketoconazole (NIZORAL) 2 % cream Apply 1 application topically daily. 30 g 0  ? rosuvastatin (CRESTOR) 10 MG tablet Take 1 tablet (10 mg total) by mouth daily. 90 tablet 3  ? lisinopril (ZESTRIL) 5 MG tablet Take 1 tablet (5 mg total) by mouth daily. 90 tablet 1  ? ?No facility-administered medications prior to visit.  ? ? ?No Known Allergies ? ?ROS ?Review of Systems  ?Constitutional:  Negative for chills and fever.  ?HENT:  Negative for congestion and sore throat.   ?  Eyes:  Negative for pain and discharge.  ?Respiratory:  Negative for cough and shortness of breath.   ?Cardiovascular:  Negative for chest pain and palpitations.  ?Gastrointestinal:  Negative for constipation, diarrhea, nausea and vomiting.  ?Endocrine: Negative for polydipsia and polyuria.  ?Genitourinary:  Negative for dysuria and hematuria.  ?Musculoskeletal:  Negative for neck pain and neck stiffness.  ?Skin:  Positive for rash.  ?Neurological:  Negative for dizziness, weakness, numbness and headaches.  ?Psychiatric/Behavioral:  Negative for agitation and behavioral problems.   ? ?  ?Objective:  ?  ?Physical Exam ?Vitals reviewed.  ?Constitutional:   ?   General: He is not in acute distress. ?   Appearance: He is not diaphoretic.   ?HENT:  ?   Head: Normocephalic and atraumatic.  ?   Nose: Nose normal.  ?   Mouth/Throat:  ?   Mouth: Mucous membranes are moist.  ?Eyes:  ?   General: No scleral icterus. ?   Extraocular Movements: Extraocular movements intact.  ?Neck:  ?   Vascular: No carotid bruit.  ?Cardiovascular:  ?   Rate and Rhythm: Normal rate and regular rhythm.  ?   Pulses: Normal pulses.  ?   Heart sounds: Normal heart sounds. No murmur heard. ?Pulmonary:  ?   Breath sounds: Normal breath sounds. No wheezing or rales.  ?Abdominal:  ?   Palpations: Abdomen is soft.  ?   Tenderness: There is no abdominal tenderness.  ?Musculoskeletal:  ?   Cervical back: Neck supple. No tenderness.  ?   Right lower leg: No edema.  ?   Left lower leg: No edema.  ?Skin: ?   General: Skin is warm.  ?   Findings: Lesion (Old surgical scar over right forearm, C/D/I) and rash (Erythematous patches, mostly circular - widespread over chest and abdominal wall, and around axilla -improved from prior) present.  ?Neurological:  ?   General: No focal deficit present.  ?   Mental Status: He is alert and oriented to person, place, and time.  ?   Cranial Nerves: No cranial nerve deficit.  ?   Sensory: No sensory deficit.  ?   Motor: No weakness.  ?Psychiatric:     ?   Mood and Affect: Mood normal.     ?   Behavior: Behavior normal.  ? ? ?BP 138/88 (BP Location: Right Arm, Patient Position: Sitting, Cuff Size: Normal)   Pulse 83   Resp 16   Ht $R'5\' 8"'Bx$  (1.727 m)   Wt 184 lb (83.5 kg)   SpO2 99%   BMI 27.98 kg/m?  ?Wt Readings from Last 3 Encounters:  ?07/26/21 184 lb (83.5 kg)  ?01/25/21 180 lb 1.9 oz (81.7 kg)  ?12/28/20 183 lb 9.6 oz (83.3 kg)  ? ? ?Lab Results  ?Component Value Date  ? TSH 2.630 12/13/2020  ? ?Lab Results  ?Component Value Date  ? WBC 4.5 12/13/2020  ? HGB 13.9 12/13/2020  ? HCT 41.4 12/13/2020  ? MCV 91 12/13/2020  ? PLT 216 12/13/2020  ? ?Lab Results  ?Component Value Date  ? NA 138 07/24/2021  ? K 4.5 07/24/2021  ? CO2 24 07/24/2021  ? GLUCOSE  102 (H) 07/24/2021  ? BUN 16 07/24/2021  ? CREATININE 0.95 07/24/2021  ? BILITOT 0.5 12/13/2020  ? ALKPHOS 76 12/13/2020  ? AST 28 12/13/2020  ? ALT 42 12/13/2020  ? PROT 7.0 12/13/2020  ? ALBUMIN 4.9 12/13/2020  ? CALCIUM 9.5 07/24/2021  ? EGFR 101 07/24/2021  ? ?  Lab Results  ?Component Value Date  ? CHOL 152 07/24/2021  ? ?Lab Results  ?Component Value Date  ? HDL 61 07/24/2021  ? ?Lab Results  ?Component Value Date  ? [place] 80 07/24/2021  ? ?Lab Results  ?Component Value Date  ? TRIG 54 07/24/2021  ? ?Lab Results  ?Component Value Date  ? CHOLHDL 2.5 07/24/2021  ? ?Lab Results  ?Component Value Date  ? HGBA1C 5.6 12/13/2020  ? ? ?  ?Assessment & Plan:  ? ?Problem List Items Addressed This Visit   ? ?  ? Cardiovascular and Mediastinum  ? Essential hypertension - Primary  ?  BP Readings from Last 1 Encounters:  ?07/26/21 138/88  ?Well-controlled with Lisinopril 5 mg QD ?Counseled for compliance with the medications ?Advised DASH diet and moderate exercise/walking, at least 150 mins/week ?  ?  ? Relevant Medications  ? lisinopril (ZESTRIL) 5 MG tablet  ? Other Relevant Orders  ? TSH  ? CMP14+EGFR  ? CBC with Differential/Platelet  ?  ? Musculoskeletal and Integument  ? Tinea versicolor  ?  Improved with ketoconazole cream ?Prescribed oral itraconazole as he has had recurrent tinea infections ?  ?  ? Relevant Medications  ? itraconazole (SPORANOX) 100 MG capsule  ?  ? Other  ? Mixed hyperlipidemia  ?  Lipid profile reviewed ?On Crestor 10 mg daily ?  ?  ? Relevant Medications  ? lisinopril (ZESTRIL) 5 MG tablet  ? Other Relevant Orders  ? Lipid Profile  ? ?Other Visit Diagnoses   ? ? Prediabetes      ? Relevant Orders  ? Hemoglobin A1c  ? Vitamin D deficiency      ? Relevant Orders  ? VITAMIN D 25 Hydroxy (Vit-D Deficiency, Fractures)  ? ?  ? ? ?Meds ordered this encounter  ?Medications  ? DISCONTD: lisinopril (ZESTRIL) 5 MG tablet  ?  Sig: Take 1 tablet (5 mg total) by mouth daily.  ?  Dispense:  90 tablet  ?   Refill:  1  ? DISCONTD: itraconazole (SPORANOX) 100 MG capsule  ?  Sig: Take 2 capsules (200 mg total) by mouth daily.  ?  Dispense:  10 capsule  ?  Refill:  0  ? lisinopril (ZESTRIL) 5 MG tablet  ?  Sig: Take 1 tabl

## 2021-07-26 NOTE — Assessment & Plan Note (Signed)
Lipid profile reviewed On Crestor 10 mg daily 

## 2021-07-26 NOTE — Assessment & Plan Note (Addendum)
Improved with ketoconazole cream ?Prescribed oral itraconazole as he has had recurrent tinea infections ?

## 2021-07-26 NOTE — Patient Instructions (Addendum)
Please continue taking medications as prescribed. ? ?Please continue to follow low salt diet and perform moderate exercise/walking at least 150 mins/week. ? ?Please take Itraconazole tablet for tinea flareup. ? ?Please get fasting blood tests done before the next visit. ?

## 2021-07-26 NOTE — Assessment & Plan Note (Signed)
BP Readings from Last 1 Encounters:  ?07/26/21 138/88  ? ?Well-controlled with Lisinopril 5 mg QD ?Counseled for compliance with the medications ?Advised DASH diet and moderate exercise/walking, at least 150 mins/week ?

## 2021-07-28 ENCOUNTER — Other Ambulatory Visit: Payer: Self-pay

## 2021-09-22 ENCOUNTER — Encounter: Payer: Self-pay | Admitting: Internal Medicine

## 2021-09-22 ENCOUNTER — Ambulatory Visit: Payer: 59 | Admitting: Internal Medicine

## 2021-09-22 VITALS — BP 124/88 | HR 77 | Resp 18 | Ht 68.0 in | Wt 178.4 lb

## 2021-09-22 DIAGNOSIS — M109 Gout, unspecified: Secondary | ICD-10-CM | POA: Diagnosis not present

## 2021-09-22 DIAGNOSIS — M1A9XX0 Chronic gout, unspecified, without tophus (tophi): Secondary | ICD-10-CM | POA: Insufficient documentation

## 2021-09-22 MED ORDER — COLCHICINE 0.6 MG PO TABS: ORAL_TABLET | ORAL | 0 refills | Status: DC

## 2021-09-22 NOTE — Progress Notes (Signed)
Acute Office Visit  Subjective:    Patient ID: Francisco Johns, male    DOB: [birth year], 46 y.o.   MRN: [id number]  Chief Complaint  Patient presents with   Toe Pain    Pt having left big toe pain flared up 3 weeks ago and was given colchicine through e visit as it was late at night then flared up again 09-21-21 has few cochicine left     HPI Patient is in today for complaint of left great toe swelling, pain and warmth since yesterday.  He recently had gout flareup, and did E-visit and was started on colchicine about 3 weeks ago.  He had resolution of the swelling after 2 weeks, but has reappeared now.  He had alcoholic drinks before the first episode, but denies any recent alcoholic drinks.  He does admit having barbecue food on a frequent basis.  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,R  Tenolysis and 5th FDS repair 6/73/41     UMBILICAL HERNIA REPAIR N/A 12/31/2019   Procedure: HERNIA REPAIR UMBILICAL WITH MESH;  Surgeon: Erroll Luna, MD;  Location: [place];  Service: General;  Laterality: N/A;    Family History  Problem Relation Age of Onset   Colon cancer Neg Hx    Esophageal cancer Neg Hx    Rectal cancer Neg Hx    Stomach cancer Neg Hx     Social History   Socioeconomic History   Marital status: Married    Spouse name: Not on file   Number of children: Not on file   Years of education: Not on file   Highest education level: Not on file  Occupational History   Not on file  Tobacco Use    Smoking status: Never   Smokeless tobacco: Never  Vaping Use   Vaping Use: Never used  Substance and Sexual Activity   Alcohol use: No    Comment: social   Drug use: No   Sexual activity: Not on file  Other Topics Concern   Not on file  Social History Narrative   ** Merged History Encounter **       Social Determinants of Health   Financial Resource Strain: Not on file  Food Insecurity: Not on file  Transportation Needs: Not on file  Physical Activity: Not on file  Stress: Not on file  Social Connections: Not on file  Intimate Partner Violence: Not on file    Outpatient Medications Prior to Visit  Medication Sig Dispense Refill   Blood Pressure Monitoring (OMRON 3 SERIES BP MONITOR) DEVI use as directed to monitor blood pressure 1 each 0   itraconazole (SPORANOX) 100 MG capsule Take 2 capsules (200 mg total) by mouth daily. 10 capsule 0   ketoconazole (NIZORAL) 2 % cream Apply 1 application topically daily. 30 g 0   lisinopril (ZESTRIL) 5 MG tablet Take 1 tablet (5 mg total) by mouth daily. 90 tablet 1   rosuvastatin (CRESTOR) 10 MG tablet Take 1 tablet (10 mg  total) by mouth daily. 90 tablet 3   colchicine 0.6 MG tablet Take 0.6 mg by mouth 2 (two) times daily.     No facility-administered medications prior to visit.    No Known Allergies  Review of Systems  Constitutional:  Negative for chills and fever.  HENT:  Negative for congestion and sore throat.   Eyes:  Negative for pain and discharge.  Respiratory:  Negative for cough and shortness of breath.   Cardiovascular:  Negative for chest pain and palpitations.  Gastrointestinal:  Negative for diarrhea, nausea and vomiting.  Endocrine: Negative for polydipsia and polyuria.  Genitourinary:  Negative for dysuria and hematuria.  Musculoskeletal:  Positive for joint swelling (Left great toe). Negative for neck pain and neck stiffness.  Skin:  Negative for rash.  Neurological:  Negative for dizziness, weakness,  numbness and headaches.  Psychiatric/Behavioral:  Negative for agitation and behavioral problems.       Objective:    Physical Exam Vitals reviewed.  Constitutional:      General: He is not in acute distress.    Appearance: He is not diaphoretic.  Eyes:     General: No scleral icterus.    Extraocular Movements: Extraocular movements intact.  Neck:     Vascular: No carotid bruit.  Cardiovascular:     Rate and Rhythm: Normal rate and regular rhythm.     Pulses: Normal pulses.     Heart sounds: Normal heart sounds. No murmur heard. Pulmonary:     Breath sounds: Normal breath sounds. No wheezing or rales.  Musculoskeletal:        General: Swelling (Left 1st MTP joint with warmth) present.     Cervical back: Neck supple. No tenderness.     Right lower leg: No edema.     Left lower leg: No edema.  Skin:    General: Skin is warm.     Findings: Lesion (Old surgical scar over right forearm, C/D/I) present.  Neurological:     General: No focal deficit present.     Mental Status: He is alert and oriented to person, place, and time.     Sensory: No sensory deficit.     Motor: No weakness.  Psychiatric:        Mood and Affect: Mood normal.        Behavior: Behavior normal.    BP 124/88 (BP Location: Right Arm, Patient Position: Sitting, Cuff Size: Normal)   Pulse 77   Resp 18   Ht '5\' 8"'$  (1.727 m)   Wt 178 lb 6.4 oz (80.9 kg)   SpO2 98%   BMI 27.13 kg/m  Wt Readings from Last 3 Encounters:  09/22/21 178 lb 6.4 oz (80.9 kg)  07/26/21 184 lb (83.5 kg)  01/25/21 180 lb 1.9 oz (81.7 kg)        Assessment & Plan:   Problem List Items Addressed This Visit       Musculoskeletal and Integument   Acute gout involving toe of left foot - Primary    Started colchicine Check uric acid level after acute flareup resolves Advised to avoid binge drinking Low purine diet, material provided       Relevant Medications   colchicine 0.6 MG tablet   Other Relevant Orders   Uric  acid     Meds ordered this encounter  Medications   colchicine 0.6 MG tablet    Sig: Please take 2 tablets today and then 1 tablet once daily.    Dispense:  20  tablet    Refill:  0     Barbra Miner Keith Rake, MD

## 2021-09-22 NOTE — Assessment & Plan Note (Signed)
Started colchicine Check uric acid level after acute flareup resolves Advised to avoid binge drinking Low purine diet, material provided

## 2021-09-22 NOTE — Patient Instructions (Addendum)
Please take Colchicine as prescribed.  Please avoid alcohol consumption and follow low purine diet as attached.  Please get uric acid level done once your gout flareup resolves.

## 2021-09-28 DIAGNOSIS — Z08 Encounter for follow-up examination after completed treatment for malignant neoplasm: Secondary | ICD-10-CM | POA: Diagnosis not present

## 2021-09-28 DIAGNOSIS — Z85820 Personal history of malignant melanoma of skin: Secondary | ICD-10-CM | POA: Diagnosis not present

## 2021-09-28 DIAGNOSIS — D2271 Melanocytic nevi of right lower limb, including hip: Secondary | ICD-10-CM | POA: Diagnosis not present

## 2021-09-28 DIAGNOSIS — D485 Neoplasm of uncertain behavior of skin: Secondary | ICD-10-CM | POA: Diagnosis not present

## 2021-09-28 DIAGNOSIS — Z1283 Encounter for screening for malignant neoplasm of skin: Secondary | ICD-10-CM | POA: Diagnosis not present

## 2021-09-28 DIAGNOSIS — D225 Melanocytic nevi of trunk: Secondary | ICD-10-CM | POA: Diagnosis not present

## 2021-10-06 DIAGNOSIS — M109 Gout, unspecified: Secondary | ICD-10-CM | POA: Diagnosis not present

## 2021-10-07 LAB — URIC ACID: Uric Acid: 7.3 mg/dL (ref 3.8–8.4)

## 2021-10-10 ENCOUNTER — Other Ambulatory Visit: Payer: Self-pay | Admitting: Internal Medicine

## 2021-11-03 ENCOUNTER — Other Ambulatory Visit (HOSPITAL_COMMUNITY): Payer: Self-pay

## 2021-12-20 DIAGNOSIS — H524 Presbyopia: Secondary | ICD-10-CM | POA: Diagnosis not present

## 2021-12-20 DIAGNOSIS — H5213 Myopia, bilateral: Secondary | ICD-10-CM | POA: Diagnosis not present

## 2021-12-20 DIAGNOSIS — H52223 Regular astigmatism, bilateral: Secondary | ICD-10-CM | POA: Diagnosis not present

## 2021-12-27 DIAGNOSIS — M79644 Pain in right finger(s): Secondary | ICD-10-CM | POA: Diagnosis not present

## 2022-01-08 DIAGNOSIS — M79644 Pain in right finger(s): Secondary | ICD-10-CM | POA: Diagnosis not present

## 2022-01-29 DIAGNOSIS — M79644 Pain in right finger(s): Secondary | ICD-10-CM | POA: Diagnosis not present

## 2022-02-01 ENCOUNTER — Other Ambulatory Visit: Payer: Self-pay | Admitting: Internal Medicine

## 2022-02-01 ENCOUNTER — Other Ambulatory Visit: Payer: Self-pay

## 2022-02-01 DIAGNOSIS — I10 Essential (primary) hypertension: Secondary | ICD-10-CM

## 2022-02-01 DIAGNOSIS — E782 Mixed hyperlipidemia: Secondary | ICD-10-CM

## 2022-02-01 MED ORDER — ROSUVASTATIN CALCIUM 10 MG PO TABS
10.0000 mg | ORAL_TABLET | Freq: Every day | ORAL | 3 refills | Status: DC
  Filled 2022-02-01: qty 90, 90d supply, fill #0
  Filled 2022-05-14: qty 90, 90d supply, fill #1
  Filled 2022-08-17 (×2): qty 90, 90d supply, fill #2
  Filled 2022-11-16: qty 90, 90d supply, fill #3
  Filled ????-??-??: fill #2

## 2022-02-01 MED ORDER — LISINOPRIL 5 MG PO TABS
5.0000 mg | ORAL_TABLET | Freq: Every day | ORAL | 1 refills | Status: DC
  Filled 2022-02-01: qty 90, 90d supply, fill #0
  Filled 2022-05-14: qty 90, 90d supply, fill #1

## 2022-02-12 ENCOUNTER — Other Ambulatory Visit (HOSPITAL_COMMUNITY): Payer: Self-pay

## 2022-02-12 ENCOUNTER — Ambulatory Visit (INDEPENDENT_AMBULATORY_CARE_PROVIDER_SITE_OTHER): Payer: 59 | Admitting: Internal Medicine

## 2022-02-12 ENCOUNTER — Encounter: Payer: Self-pay | Admitting: Internal Medicine

## 2022-02-12 VITALS — BP 138/86 | HR 85 | Resp 18 | Ht 68.0 in | Wt 178.6 lb

## 2022-02-12 DIAGNOSIS — I10 Essential (primary) hypertension: Secondary | ICD-10-CM

## 2022-02-12 DIAGNOSIS — E782 Mixed hyperlipidemia: Secondary | ICD-10-CM | POA: Diagnosis not present

## 2022-02-12 DIAGNOSIS — Z0001 Encounter for general adult medical examination with abnormal findings: Secondary | ICD-10-CM | POA: Diagnosis not present

## 2022-02-12 DIAGNOSIS — E559 Vitamin D deficiency, unspecified: Secondary | ICD-10-CM

## 2022-02-12 DIAGNOSIS — M1A9XX0 Chronic gout, unspecified, without tophus (tophi): Secondary | ICD-10-CM

## 2022-02-12 DIAGNOSIS — Z2821 Immunization not carried out because of patient refusal: Secondary | ICD-10-CM | POA: Diagnosis not present

## 2022-02-12 MED ORDER — ALLOPURINOL 100 MG PO TABS
100.0000 mg | ORAL_TABLET | Freq: Every day | ORAL | 5 refills | Status: DC
  Filled 2022-02-12: qty 30, 30d supply, fill #0
  Filled 2022-03-09: qty 30, 30d supply, fill #1
  Filled 2022-04-12: qty 30, 30d supply, fill #2
  Filled 2022-05-14: qty 30, 30d supply, fill #0
  Filled 2022-06-20: qty 30, 30d supply, fill #1
  Filled 2022-07-20: qty 30, 30d supply, fill #2
  Filled ????-??-??: fill #2

## 2022-02-12 NOTE — Assessment & Plan Note (Signed)
Physical exam as documented. Fasting blood tests today. 

## 2022-02-12 NOTE — Assessment & Plan Note (Signed)
On Crestor 10 mg daily Check lipid profile 

## 2022-02-12 NOTE — Assessment & Plan Note (Signed)
BP Readings from Last 1 Encounters:  02/12/22 138/86   Overall well-controlled with Lisinopril 5 mg QD, needs to improve diet Counseled for compliance with the medications Advised DASH diet and moderate exercise/walking, at least 150 mins/week

## 2022-02-12 NOTE — Patient Instructions (Signed)
Please take Allopurinol as prescribed.  Please continue taking other medications as prescribed.  Please follow DASH diet and perform moderate exercise/walking at least 150 mins/week.

## 2022-02-12 NOTE — Progress Notes (Signed)
 Established Patient Office Visit  Subjective:  Patient ID: Francisco Johns, male    DOB: 05/13/1975  Age: 46 y.o. MRN: [id number]  CC:  Chief Complaint  Patient presents with   Annual Exam    Annual exam     HPI Francisco Johns is a 46 y.o. male with past medical history of HTN, HLD, melanoma and umbilical hernia s/p repair who presents for annual physical.  HTN: His BP is borderline elevated.  He states that his BP runs around the same at home mostly, but improves to 120s/70s if he improves his diet.  He denies any headache, dizziness, chest pain, dyspnea or palpitations.  Gout: He reports multiple episodes of left great toe swelling since the last visit, which improves with 1 tablet of colchicine (?).  His uric acid level was 7.2.    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,R  Tenolysis and 5th FDS repair 08/06/17     UMBILICAL HERNIA REPAIR N/A 12/31/2019   Procedure: HERNIA REPAIR UMBILICAL WITH MESH;  Surgeon: Cornett, Thomas, MD;  Location: [facility];  Service: General;  Laterality: N/A;    Family History  Problem Relation Age of Onset   Colon cancer Neg Hx    Esophageal cancer Neg Hx    Rectal cancer Neg Hx    Stomach cancer Neg Hx     Social History   Socioeconomic History   Marital status: Married    Spouse name: Not on file   Number of children: Not on file   Years of education: Not on file   Highest education level: Not on file  Occupational History   Not on file  Tobacco Use    Smoking status: Never   Smokeless tobacco: Never  Vaping Use   Vaping Use: Never used  Substance and Sexual Activity   Alcohol use: No    Comment: social   Drug use: No   Sexual activity: Not on file  Other Topics Concern   Not on file  Social History Narrative   ** Merged History Encounter **       Social Determinants of Health   Financial Resource Strain: Not on file  Food Insecurity: Not on file  Transportation Needs: Not on file  Physical Activity: Not on file  Stress: Not on file  Social Connections: Not on file  Intimate Partner Violence: Not on file    Outpatient Medications Prior to Visit  Medication Sig Dispense Refill   Blood Pressure Monitoring (OMRON 3 SERIES BP MONITOR) DEVI use as directed to monitor blood pressure 1 each 0   colchicine 0.6 MG tablet Please take 2 tablets today and then 1 tablet once daily. 20 tablet 0   ketoconazole (NIZORAL) 2 % cream Apply 1 application topically daily. 30 g 0   lisinopril (ZESTRIL) 5 MG tablet Take 1 tablet (5 mg total) by mouth daily. 90 tablet 1   rosuvastatin (CRESTOR) 10 MG tablet Take 1 tablet (10 mg total) by mouth daily. 90   tablet 3   itraconazole (SPORANOX) 100 MG capsule Take 2 capsules (200 mg total) by mouth daily. 10 capsule 0   No facility-administered medications prior to visit.    No Known Allergies  ROS Review of Systems  Constitutional:  Negative for chills and fever.  HENT:  Negative for congestion and sore throat.   Eyes:  Negative for pain and discharge.  Respiratory:  Negative for cough and shortness of breath.   Cardiovascular:  Negative for chest pain and palpitations.  Gastrointestinal:  Negative for diarrhea, nausea and vomiting.  Endocrine: Negative for polydipsia and polyuria.  Genitourinary:  Negative for dysuria and hematuria.  Musculoskeletal:  Positive for joint swelling (Left great toe, recurrent). Negative for neck pain and neck stiffness.  Skin:  Negative for rash.  Neurological:   Negative for dizziness, weakness, numbness and headaches.  Psychiatric/Behavioral:  Negative for agitation and behavioral problems.       Objective:    Physical Exam Vitals reviewed.  Constitutional:      General: He is not in acute distress.    Appearance: He is not diaphoretic.  Eyes:     General: No scleral icterus.    Extraocular Movements: Extraocular movements intact.  Neck:     Vascular: No carotid bruit.  Cardiovascular:     Rate and Rhythm: Normal rate and regular rhythm.     Pulses: Normal pulses.     Heart sounds: Normal heart sounds. No murmur heard. Pulmonary:     Breath sounds: Normal breath sounds. No wheezing or rales.  Abdominal:     Palpations: Abdomen is soft.     Tenderness: There is no abdominal tenderness.  Musculoskeletal:     Cervical back: Neck supple. No tenderness.     Right lower leg: No edema.     Left lower leg: No edema.  Skin:    General: Skin is warm.     Findings: Lesion (Old surgical scar over right forearm, C/D/I) present.  Neurological:     General: No focal deficit present.     Mental Status: He is alert and oriented to person, place, and time.     Cranial Nerves: No cranial nerve deficit.     Sensory: No sensory deficit.     Motor: No weakness.  Psychiatric:        Mood and Affect: Mood normal.        Behavior: Behavior normal.     BP 138/86 (BP Location: Right Arm, Patient Position: Sitting, Cuff Size: Normal)   Pulse 85   Resp 18   Ht 5' 8" (1.727 m)   Wt 178 lb 9.6 oz (81 kg)   SpO2 97%   BMI 27.16 kg/m  Wt Readings from Last 3 Encounters:  02/12/22 178 lb 9.6 oz (81 kg)  09/22/21 178 lb 6.4 oz (80.9 kg)  07/26/21 184 lb (83.5 kg)    Lab Results  Component Value Date   TSH 2.630 12/13/2020   Lab Results  Component Value Date   WBC 4.5 12/13/2020   HGB 13.9 12/13/2020   HCT 41.4 12/13/2020   MCV 91 12/13/2020   PLT 216 12/13/2020   Lab Results  Component Value Date   NA 138 07/24/2021   K 4.5 07/24/2021    CO2 24 07/24/2021   GLUCOSE 102 (H) 07/24/2021   BUN 16 07/24/2021   CREATININE 0.95 07/24/2021   BILITOT 0.5 12/13/2020   ALKPHOS 76 12/13/2020   AST 28 12/13/2020   ALT 42 12/13/2020  PROT 7.0 12/13/2020   ALBUMIN 4.9 12/13/2020   CALCIUM 9.5 07/24/2021   EGFR 101 07/24/2021   Lab Results  Component Value Date   CHOL 152 07/24/2021   Lab Results  Component Value Date   HDL 61 07/24/2021   Lab Results  Component Value Date   LDLCALC 80 07/24/2021   Lab Results  Component Value Date   TRIG 54 07/24/2021   Lab Results  Component Value Date   CHOLHDL 2.5 07/24/2021   Lab Results  Component Value Date   HGBA1C 5.6 12/13/2020      Assessment & Plan:   Problem List Items Addressed This Visit       Cardiovascular and Mediastinum   Essential hypertension    BP Readings from Last 1 Encounters:  02/12/22 138/86  Overall well-controlled with Lisinopril 5 mg QD, needs to improve diet Counseled for compliance with the medications Advised DASH diet and moderate exercise/walking, at least 150 mins/week      Relevant Orders   TSH   CMP14+EGFR   CBC with Differential/Platelet     Musculoskeletal and Integument   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    Started allopurinol Has colchicine as needed for acute flareup Check uric acid level Advised to avoid binge drinking Low purine diet, material provided      Relevant Medications   allopurinol (ZYLOPRIM) 100 MG tablet   Other Relevant Orders   Uric acid     Other   Mixed hyperlipidemia    On Crestor 10 mg daily Check lipid profile      Relevant Orders   Lipid panel   Encounter for general adult medical examination with abnormal findings - Primary    Physical exam as documented. Fasting blood tests today.      Relevant Orders   Hemoglobin A1c   CMP14+EGFR   CBC with Differential/Platelet   Other Visit Diagnoses     Vitamin D deficiency       Relevant Orders   VITAMIN D 25 Hydroxy (Vit-D  Deficiency, Fractures)   Refused influenza vaccine           Meds ordered this encounter  Medications   allopurinol (ZYLOPRIM) 100 MG tablet    Sig: Take 1 tablet (100 mg total) by mouth daily.    Dispense:  30 tablet    Refill:  5    Follow-up: Return in about 6 months (around 08/14/2022) for HTN.    Rutwik K Patel, MD 

## 2022-02-12 NOTE — Assessment & Plan Note (Signed)
Started allopurinol Has colchicine as needed for acute flareup Check uric acid level Advised to avoid binge drinking Low purine diet, material provided

## 2022-02-13 LAB — CMP14+EGFR
ALT: 43 IU/L (ref 0–44)
AST: 33 IU/L (ref 0–40)
Albumin/Globulin Ratio: 2 (ref 1.2–2.2)
Albumin: 5 g/dL (ref 4.1–5.1)
Alkaline Phosphatase: 80 IU/L (ref 44–121)
BUN/Creatinine Ratio: 18 (ref 9–20)
BUN: 17 mg/dL (ref 6–24)
Bilirubin Total: 0.3 mg/dL (ref 0.0–1.2)
CO2: 22 mmol/L (ref 20–29)
Calcium: 9.7 mg/dL (ref 8.7–10.2)
Chloride: 101 mmol/L (ref 96–106)
Creatinine, Ser: 0.95 mg/dL (ref 0.76–1.27)
Globulin, Total: 2.5 g/dL (ref 1.5–4.5)
Glucose: 95 mg/dL (ref 70–99)
Potassium: 4.8 mmol/L (ref 3.5–5.2)
Sodium: 141 mmol/L (ref 134–144)
Total Protein: 7.5 g/dL (ref 6.0–8.5)
eGFR: 100 mL/min/{1.73_m2} (ref >59)

## 2022-02-13 LAB — LIPID PANEL
Chol/HDL Ratio: 2.7 ratio (ref 0.0–5.0)
Cholesterol, Total: 152 mg/dL (ref 100–199)
HDL: 56 mg/dL (ref >39)
LDL Chol Calc (NIH): 71 mg/dL (ref 0–99)
Triglycerides: 143 mg/dL (ref 0–149)
VLDL Cholesterol Cal: 25 mg/dL (ref 5–40)

## 2022-02-13 LAB — HEMOGLOBIN A1C
Est. average glucose Bld gHb Est-mCnc: 120 mg/dL
Hgb A1c MFr Bld: 5.8 % — ABNORMAL HIGH (ref 4.8–5.6)

## 2022-02-13 LAB — TSH: TSH: 1.86 u[IU]/mL (ref 0.450–4.500)

## 2022-02-13 LAB — CBC WITH DIFFERENTIAL/PLATELET
Basophils Absolute: 0 10*3/uL (ref 0.0–0.2)
Basos: 1 %
EOS (ABSOLUTE): 0.1 10*3/uL (ref 0.0–0.4)
Eos: 2 %
Hematocrit: 43 % (ref 37.5–51.0)
Hemoglobin: 14.2 g/dL (ref 13.0–17.7)
Immature Grans (Abs): 0 10*3/uL (ref 0.0–0.1)
Immature Granulocytes: 0 %
Lymphocytes Absolute: 1.7 10*3/uL (ref 0.7–3.1)
Lymphs: 32 %
MCH: 30.4 pg (ref 26.6–33.0)
MCHC: 33 g/dL (ref 31.5–35.7)
MCV: 92 fL (ref 79–97)
Monocytes Absolute: 0.4 10*3/uL (ref 0.1–0.9)
Monocytes: 8 %
Neutrophils Absolute: 3.1 10*3/uL (ref 1.4–7.0)
Neutrophils: 57 %
Platelets: 255 10*3/uL (ref 150–450)
RBC: 4.67 x10E6/uL (ref 4.14–5.80)
RDW: 12.8 % (ref 11.6–15.4)
WBC: 5.3 10*3/uL (ref 3.4–10.8)

## 2022-02-13 LAB — VITAMIN D 25 HYDROXY (VIT D DEFICIENCY, FRACTURES): Vit D, 25-Hydroxy: 24.9 ng/mL — ABNORMAL LOW (ref 30.0–100.0)

## 2022-02-13 LAB — URIC ACID: Uric Acid: 8.8 mg/dL — ABNORMAL HIGH (ref 3.8–8.4)

## 2022-03-09 ENCOUNTER — Other Ambulatory Visit: Payer: Self-pay

## 2022-04-13 ENCOUNTER — Other Ambulatory Visit: Payer: Self-pay

## 2022-05-14 ENCOUNTER — Other Ambulatory Visit: Payer: Self-pay

## 2022-06-20 ENCOUNTER — Other Ambulatory Visit (HOSPITAL_COMMUNITY): Payer: Self-pay

## 2022-07-18 ENCOUNTER — Other Ambulatory Visit: Payer: Self-pay | Admitting: Internal Medicine

## 2022-07-18 ENCOUNTER — Other Ambulatory Visit: Payer: Self-pay

## 2022-07-18 DIAGNOSIS — I10 Essential (primary) hypertension: Secondary | ICD-10-CM

## 2022-07-18 MED FILL — Lisinopril Tab 5 MG: ORAL | 90 days supply | Qty: 90 | Fill #0 | Status: CN

## 2022-07-19 ENCOUNTER — Other Ambulatory Visit: Payer: Self-pay

## 2022-07-20 ENCOUNTER — Other Ambulatory Visit: Payer: Self-pay

## 2022-07-20 ENCOUNTER — Other Ambulatory Visit (HOSPITAL_COMMUNITY): Payer: Self-pay

## 2022-08-17 ENCOUNTER — Other Ambulatory Visit (HOSPITAL_BASED_OUTPATIENT_CLINIC_OR_DEPARTMENT_OTHER): Payer: Self-pay

## 2022-08-17 ENCOUNTER — Other Ambulatory Visit: Payer: Self-pay | Admitting: Internal Medicine

## 2022-08-17 ENCOUNTER — Other Ambulatory Visit (HOSPITAL_COMMUNITY): Payer: Self-pay

## 2022-08-17 ENCOUNTER — Other Ambulatory Visit: Payer: Self-pay

## 2022-08-17 DIAGNOSIS — M1A9XX0 Chronic gout, unspecified, without tophus (tophi): Secondary | ICD-10-CM

## 2022-08-17 MED FILL — Lisinopril Tab 5 MG: ORAL | 90 days supply | Qty: 90 | Fill #0 | Status: AC

## 2022-08-20 ENCOUNTER — Encounter: Payer: Self-pay | Admitting: Internal Medicine

## 2022-08-20 ENCOUNTER — Other Ambulatory Visit (HOSPITAL_COMMUNITY): Payer: Self-pay

## 2022-08-20 ENCOUNTER — Ambulatory Visit: Payer: 59 | Admitting: Internal Medicine

## 2022-08-20 VITALS — BP 142/86 | HR 80 | Ht 68.0 in | Wt 183.6 lb

## 2022-08-20 DIAGNOSIS — E782 Mixed hyperlipidemia: Secondary | ICD-10-CM

## 2022-08-20 DIAGNOSIS — I10 Essential (primary) hypertension: Secondary | ICD-10-CM | POA: Diagnosis not present

## 2022-08-20 DIAGNOSIS — K219 Gastro-esophageal reflux disease without esophagitis: Secondary | ICD-10-CM | POA: Diagnosis not present

## 2022-08-20 DIAGNOSIS — Z85820 Personal history of malignant melanoma of skin: Secondary | ICD-10-CM | POA: Diagnosis not present

## 2022-08-20 DIAGNOSIS — M1A9XX0 Chronic gout, unspecified, without tophus (tophi): Secondary | ICD-10-CM

## 2022-08-20 MED ORDER — OLMESARTAN MEDOXOMIL 20 MG PO TABS
20.0000 mg | ORAL_TABLET | Freq: Every day | ORAL | 3 refills | Status: DC
Start: 2022-08-20 — End: 2022-10-22
  Filled 2022-08-20: qty 30, 30d supply, fill #0

## 2022-08-20 MED ORDER — ALLOPURINOL 100 MG PO TABS
100.0000 mg | ORAL_TABLET | Freq: Every day | ORAL | 1 refills | Status: DC
Start: 2022-08-20 — End: 2023-03-15
  Filled 2022-08-20: qty 90, 90d supply, fill #0
  Filled 2022-11-16: qty 90, 90d supply, fill #1

## 2022-08-20 NOTE — Assessment & Plan Note (Addendum)
On Crestor 10 mg daily Reviewed last lipid profile

## 2022-08-20 NOTE — Progress Notes (Addendum)
Established Patient Office Visit  Subjective:  Patient ID: Francisco Johns, male    DOB: [birth year]  Age: 47 y.o. MRN: [id number]  CC:  Chief Complaint  Patient presents with   Hypertension    Six month follow up     HPI Francisco Johns is a 47 y.o. male with past medical history of HTN, HLD, melanoma and umbilical hernia s/p repair who presents for f/u of his chronic medical conditions.  HTN: BP is elevated today.  He reports elevated blood pressure readings at home as well, around 140s/90s. Takes lisinopril 5 mg QD regularly.  He reports chronic cough and throat irritation since staring lisinopril. Patient denies headache, dizziness, chest pain, dyspnea or palpitations.  His cholesterol profile is better with Crestor now.  Gout: Denies any recent acute flareup of gout since starting allopurinol.  He reports mild epigastric pain and acid reflux, which usually improves with Tums.  Denies any nausea or vomiting.  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,R  Tenolysis and 5th FDS repair 08/06/17     UMBILICAL HERNIA REPAIR N/A 12/31/2019   Procedure: HERNIA REPAIR UMBILICAL WITH MESH;  Surgeon: Harriette Bouillon, MD;  Location: [facility];  Service: General;  Laterality: N/A;    Family History  Problem Relation Age of Onset   Colon cancer Neg Hx    Esophageal cancer Neg Hx    Rectal cancer Neg Hx    Stomach cancer Neg Hx     Social History   Socioeconomic History   Marital status: Married    Spouse  name: Not on file   Number of children: Not on file   Years of education: Not on file   Highest education level: Not on file  Occupational History   Not on file  Tobacco Use   Smoking status: Never   Smokeless tobacco: Never  Vaping Use   Vaping Use: Never used  Substance and Sexual Activity   Alcohol use: No    Comment: social   Drug use: No   Sexual activity: Not on file  Other Topics Concern   Not on file  Social History Narrative   ** Merged History Encounter **       Social Determinants of Health   Financial Resource Strain: Not on file  Food Insecurity: Not on file  Transportation Needs: Not on file  Physical Activity: Not on file  Stress: Not on file  Social Connections: Not on file  Intimate Partner Violence: Not on file    Outpatient Medications Prior to Visit  Medication Sig Dispense Refill   colchicine 0.6 MG tablet Please take 2 tablets today and then 1 tablet once daily. 20 tablet 0   rosuvastatin (CRESTOR) 10 MG tablet Take 1 tablet (10 mg total) by mouth daily. 90 tablet 3   allopurinol (ZYLOPRIM) 100 MG tablet Take 1 tablet (100 mg total) by mouth daily. 30 tablet 5   Blood Pressure Monitoring (OMRON 3 SERIES BP MONITOR) DEVI use as directed to  monitor blood pressure 1 each 0   ketoconazole (NIZORAL) 2 % cream Apply 1 application topically daily. 30 g 0   lisinopril (ZESTRIL) 5 MG tablet Take 1 tablet (5 mg total) by mouth daily. 90 tablet 1   No facility-administered medications prior to visit.    No Known Allergies  ROS Review of Systems  Constitutional:  Negative for chills and fever.  HENT:  Negative for congestion and sore throat.   Eyes:  Negative for pain and discharge.  Respiratory:  Positive for cough. Negative for shortness of breath.   Cardiovascular:  Negative for chest pain and palpitations.  Gastrointestinal:  Negative for constipation, diarrhea, nausea and vomiting.  Endocrine: Negative for polydipsia and polyuria.  Genitourinary:   Negative for dysuria and hematuria.  Musculoskeletal:  Negative for neck pain and neck stiffness.  Skin:  Negative for rash.  Neurological:  Negative for dizziness, weakness, numbness and headaches.  Psychiatric/Behavioral:  Negative for agitation and behavioral problems.       Objective:    Physical Exam Vitals reviewed.  Constitutional:      General: He is not in acute distress.    Appearance: He is not diaphoretic.  HENT:     Head: Normocephalic and atraumatic.     Nose: Nose normal.     Mouth/Throat:     Mouth: Mucous membranes are moist.  Eyes:     General: No scleral icterus.    Extraocular Movements: Extraocular movements intact.  Neck:     Vascular: No carotid bruit.  Cardiovascular:     Rate and Rhythm: Normal rate and regular rhythm.     Pulses: Normal pulses.     Heart sounds: Normal heart sounds. No murmur heard. Pulmonary:     Breath sounds: Normal breath sounds. No wheezing or rales.  Abdominal:     Palpations: Abdomen is soft.     Tenderness: There is no abdominal tenderness.  Musculoskeletal:     Cervical back: Neck supple. No tenderness.     Right lower leg: No edema.     Left lower leg: No edema.  Skin:    General: Skin is warm.     Findings: Lesion (Old surgical scar over right forearm, C/D/I) present. No rash.  Neurological:     General: No focal deficit present.     Mental Status: He is alert and oriented to person, place, and time.     Cranial Nerves: No cranial nerve deficit.     Sensory: No sensory deficit.     Motor: No weakness.  Psychiatric:        Mood and Affect: Mood normal.        Behavior: Behavior normal.     BP (!) 142/86 (BP Location: Right Arm, Cuff Size: Normal)   Pulse 80   Ht 5\' 8"  (1.727 m)   Wt 183 lb 9.6 oz (83.3 kg)   SpO2 97%   BMI 27.92 kg/m  Wt Readings from Last 3 Encounters:  08/20/22 183 lb 9.6 oz (83.3 kg)  02/12/22 178 lb 9.6 oz (81 kg)  09/22/21 178 lb 6.4 oz (80.9 kg)    Lab Results  Component  Value Date   TSH 1.860 02/12/2022   Lab Results  Component Value Date   WBC 5.3 02/12/2022   HGB 14.2 02/12/2022   HCT 43.0 02/12/2022   MCV 92 02/12/2022   PLT 255 02/12/2022   Lab Results  Component Value Date   NA 141 02/12/2022   K 4.8 02/12/2022  CO2 22 02/12/2022   GLUCOSE 95 02/12/2022   BUN 17 02/12/2022   CREATININE 0.95 02/12/2022   BILITOT 0.3 02/12/2022   ALKPHOS 80 02/12/2022   AST 33 02/12/2022   ALT 43 02/12/2022   PROT 7.5 02/12/2022   ALBUMIN 5.0 02/12/2022   CALCIUM 9.7 02/12/2022   EGFR 100 02/12/2022   Lab Results  Component Value Date   CHOL 152 02/12/2022   Lab Results  Component Value Date   HDL 56 02/12/2022   Lab Results  Component Value Date   LDLCALC 71 02/12/2022   Lab Results  Component Value Date   TRIG 143 02/12/2022   Lab Results  Component Value Date   CHOLHDL 2.7 02/12/2022   Lab Results  Component Value Date   HGBA1C 5.8 (H) 02/12/2022      Assessment & Plan:   Problem List Items Addressed This Visit       Cardiovascular and Mediastinum   Essential hypertension - Primary    BP Readings from Last 1 Encounters:  02/12/22 138/86  Uncontrolled with Lisinopril 5 mg QD, needs to improve diet Due to chronic cough, switched to olmesartan 20 mg QD Counseled for compliance with the medications Advised DASH diet and moderate exercise/walking, at least 150 mins/week      Relevant Medications   olmesartan (BENICAR) 20 MG tablet   Other Relevant Orders   Basic Metabolic Panel (BMET)     Digestive   Gastroesophageal reflux disease    Epigastric pain could be due to gastritis/GERD Advised to take Pepcid PRN        Musculoskeletal and Integument   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    Dispense:  30 tablet    Refill:  3   allopurinol (ZYLOPRIM) 100 MG tablet    Sig: Take 1 tablet (100 mg total) by mouth daily.    Dispense:  90 tablet    Refill:  1    Follow-up: Return in about 2 months (around 10/20/2022) for HTN.    Anabel Halon, MD

## 2022-08-20 NOTE — Assessment & Plan Note (Addendum)
On allopurinol Has colchicine as needed for acute flareup Check uric acid level Advised to avoid binge drinking Low purine diet, material provided

## 2022-08-20 NOTE — Assessment & Plan Note (Signed)
Epigastric pain could be due to gastritis/GERD Advised to take Pepcid PRN

## 2022-08-20 NOTE — Patient Instructions (Signed)
Please stop taking Lisinopril and start taking Olmesartan as prescribed.  Please continue to take medications as prescribed.  Please continue to follow low salt diet and perform moderate exercise/walking at least 150 mins/week.

## 2022-08-20 NOTE — Assessment & Plan Note (Addendum)
BP Readings from Last 1 Encounters:  02/12/22 138/86   Uncontrolled with Lisinopril 5 mg QD, needs to improve diet Due to chronic cough, switched to olmesartan 20 mg QD Counseled for compliance with the medications Advised DASH diet and moderate exercise/walking, at least 150 mins/week

## 2022-08-20 NOTE — Assessment & Plan Note (Signed)
Followed by Dermatology. 

## 2022-08-21 LAB — BASIC METABOLIC PANEL
BUN/Creatinine Ratio: 16 (ref 9–20)
BUN: 15 mg/dL (ref 6–24)
CO2: 21 mmol/L (ref 20–29)
Calcium: 9.9 mg/dL (ref 8.7–10.2)
Chloride: 106 mmol/L (ref 96–106)
Creatinine, Ser: 0.93 mg/dL (ref 0.76–1.27)
Glucose: 88 mg/dL (ref 70–99)
Potassium: 4.7 mmol/L (ref 3.5–5.2)
Sodium: 143 mmol/L (ref 134–144)
eGFR: 102 mL/min/{1.73_m2} (ref >59)

## 2022-08-21 LAB — URIC ACID: Uric Acid: 6.2 mg/dL (ref 3.8–8.4)

## 2022-10-15 ENCOUNTER — Other Ambulatory Visit (HOSPITAL_COMMUNITY): Payer: Self-pay

## 2022-10-15 DIAGNOSIS — M5412 Radiculopathy, cervical region: Secondary | ICD-10-CM | POA: Diagnosis not present

## 2022-10-15 DIAGNOSIS — M25511 Pain in right shoulder: Secondary | ICD-10-CM | POA: Diagnosis not present

## 2022-10-15 MED ORDER — METHYLPREDNISOLONE 4 MG PO TBPK
ORAL_TABLET | ORAL | 0 refills | Status: DC
  Filled 2022-10-15: qty 21, 6d supply, fill #0

## 2022-10-22 ENCOUNTER — Encounter: Payer: Self-pay | Admitting: Internal Medicine

## 2022-10-22 ENCOUNTER — Ambulatory Visit: Payer: 59 | Admitting: Internal Medicine

## 2022-10-22 ENCOUNTER — Other Ambulatory Visit (HOSPITAL_COMMUNITY): Payer: Self-pay

## 2022-10-22 VITALS — BP 129/87 | HR 69 | Ht 68.0 in | Wt 179.8 lb

## 2022-10-22 DIAGNOSIS — I10 Essential (primary) hypertension: Secondary | ICD-10-CM | POA: Diagnosis not present

## 2022-10-22 DIAGNOSIS — R7303 Prediabetes: Secondary | ICD-10-CM | POA: Diagnosis not present

## 2022-10-22 DIAGNOSIS — E559 Vitamin D deficiency, unspecified: Secondary | ICD-10-CM

## 2022-10-22 DIAGNOSIS — M1A9XX0 Chronic gout, unspecified, without tophus (tophi): Secondary | ICD-10-CM

## 2022-10-22 DIAGNOSIS — E782 Mixed hyperlipidemia: Secondary | ICD-10-CM | POA: Diagnosis not present

## 2022-10-22 MED ORDER — LISINOPRIL 5 MG PO TABS
5.0000 mg | ORAL_TABLET | Freq: Every day | ORAL | 1 refills | Status: DC
Start: 2022-10-22 — End: 2023-03-28
  Filled 2022-10-22 – 2022-11-16 (×2): qty 90, 90d supply, fill #0
  Filled 2023-03-15: qty 90, 90d supply, fill #1

## 2022-10-22 NOTE — Progress Notes (Signed)
Established Patient Office Visit  Subjective:  Patient ID: Francisco Johns, male    DOB: 1975/09/27  Age: 47 y.o. MRN: [id number]  CC:  Chief Complaint  Patient presents with   Hypertension    Two month follow up. Patient was switched from lisinopril to olmesartan , patient has now stopped the olmesartan due to making him dizzy    HPI Francisco Johns is a 47 y.o. male with past medical history of HTN, HLD, melanoma and umbilical hernia s/p repair who presents for f/u of his chronic medical conditions.  HTN: He was placed on olmesartan instead of lisinopril due to uncontrolled blood pressure and cough.  He had dizziness with all olmesartan and has started taking lisinopril again.  He denies any cough currently.  BP is wnl today. He reports wnl blood pressure readings at home as well, around 130s/80s. Patient denies headache, dizziness, chest pain, dyspnea or palpitations.  His cholesterol profile is better with Crestor now.  Gout: Denies any recent acute flareup of gout since starting allopurinol.  He had neck pain for the last few weeks.  [facility] and was given Medrol Dosepak, which did not give him relief.  He also had dry needling done, which helped him with the pain.  He is undergoing PT for it.  He reports mild epigastric pain and acid reflux, which usually improves with Tums.  Denies any nausea or vomiting.  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,R  Tenolysis and 5th FDS  repair 08/06/17     UMBILICAL HERNIA REPAIR N/A 12/31/2019   Procedure: HERNIA REPAIR UMBILICAL WITH MESH;  Surgeon: Harriette Bouillon, MD;  Location: [facility];  Service: General;  Laterality: N/A;    Family History  Problem Relation Age of Onset   Colon cancer Neg Hx    Esophageal cancer Neg Hx    Rectal cancer Neg Hx    Stomach cancer Neg Hx     Social History   Socioeconomic History   Marital status: Married    Spouse name: Not on file   Number of children: Not on file   Years of education: Not on file   Highest education level: Not on file  Occupational History   Not on file  Tobacco Use   Smoking status: Never   Smokeless tobacco: Never  Vaping Use   Vaping Use: Never used  Substance and Sexual Activity   Alcohol use: No    Comment: social   Drug use: No   Sexual activity: Not on file  Other Topics Concern   Not on file  Social History Narrative   ** Merged History Encounter **       Social Determinants of Health   Financial Resource Strain: Not on file  Food Insecurity: Not on file  Transportation Needs: Not on file  Physical Activity: Not on file  Stress: Not on file  Social Connections: Not on file  Intimate Partner Violence: Not on file  Outpatient Medications Prior to Visit  Medication Sig Dispense Refill   allopurinol (ZYLOPRIM) 100 MG tablet Take 1 tablet (100 mg total) by mouth daily. 90 tablet 1   colchicine 0.6 MG tablet Please take 2 tablets today and then 1 tablet once daily. 20 tablet 0   rosuvastatin (CRESTOR) 10 MG tablet Take 1 tablet (10 mg total) by mouth daily. 90 tablet 3   methylPREDNISolone (MEDROL) 4 MG TBPK tablet FOLLOW PACKAGE DIRECTIONS 21 tablet 0   olmesartan (BENICAR) 20 MG tablet Take 1 tablet (20 mg total) by mouth daily. 30 tablet 3   No facility-administered medications prior to visit.    No Known Allergies  ROS Review of Systems  Constitutional:  Negative for chills and fever.  HENT:  Negative for  congestion and sore throat.   Eyes:  Negative for pain and discharge.  Respiratory:  Negative for cough and shortness of breath.   Cardiovascular:  Negative for chest pain and palpitations.  Gastrointestinal:  Negative for constipation, diarrhea, nausea and vomiting.  Endocrine: Negative for polydipsia and polyuria.  Genitourinary:  Negative for dysuria and hematuria.  Musculoskeletal:  Negative for neck pain and neck stiffness.  Skin:  Negative for rash.  Neurological:  Negative for dizziness, weakness, numbness and headaches.  Psychiatric/Behavioral:  Negative for agitation and behavioral problems.       Objective:    Physical Exam Vitals reviewed.  Constitutional:      General: He is not in acute distress.    Appearance: He is not diaphoretic.  HENT:     Head: Normocephalic and atraumatic.     Nose: Nose normal.     Mouth/Throat:     Mouth: Mucous membranes are moist.  Eyes:     General: No scleral icterus.    Extraocular Movements: Extraocular movements intact.  Neck:     Vascular: No carotid bruit.  Cardiovascular:     Rate and Rhythm: Normal rate and regular rhythm.     Pulses: Normal pulses.     Heart sounds: Normal heart sounds. No murmur heard. Pulmonary:     Breath sounds: Normal breath sounds. No wheezing or rales.  Abdominal:     Palpations: Abdomen is soft.     Tenderness: There is no abdominal tenderness.  Musculoskeletal:     Cervical back: Neck supple. No tenderness.     Right lower leg: No edema.     Left lower leg: No edema.  Skin:    General: Skin is warm.     Findings: Lesion (Old surgical scar over right forearm, C/D/I) present. No rash.  Neurological:     General: No focal deficit present.     Mental Status: He is alert and oriented to person, place, and time.     Cranial Nerves: No cranial nerve deficit.     Sensory: No sensory deficit.     Motor: No weakness.  Psychiatric:        Mood and Affect: Mood normal.        Behavior: Behavior  normal.     BP 129/87 (BP Location: Right Arm, Patient Position: Sitting, Cuff Size: Normal)   Pulse 69   Ht 5\' 8"  (1.727 m)   Wt 179 lb 12.8 oz (81.6 kg)   SpO2 98%   BMI 27.34 kg/m  Wt Readings from Last 3 Encounters:  10/22/22 179 lb 12.8 oz (81.6 kg)  08/20/22 183 lb 9.6 oz (83.3 kg)  02/12/22 178 lb 9.6 oz (81 kg)  Lab Results  Component Value Date   TSH 1.860 02/12/2022   Lab Results  Component Value Date   WBC 5.3 02/12/2022   HGB 14.2 02/12/2022   HCT 43.0 02/12/2022   MCV 92 02/12/2022   PLT 255 02/12/2022   Lab Results  Component Value Date   NA 143 08/20/2022   K 4.7 08/20/2022   CO2 21 08/20/2022   GLUCOSE 88 08/20/2022   BUN 15 08/20/2022   CREATININE 0.93 08/20/2022   BILITOT 0.3 02/12/2022   ALKPHOS 80 02/12/2022   AST 33 02/12/2022   ALT 43 02/12/2022   PROT 7.5 02/12/2022   ALBUMIN 5.0 02/12/2022   CALCIUM 9.9 08/20/2022   EGFR 102 08/20/2022   Lab Results  Component Value Date   CHOL 152 02/12/2022   Lab Results  Component Value Date   HDL 56 02/12/2022   Lab Results  Component Value Date   LDLCALC 71 02/12/2022   Lab Results  Component Value Date   TRIG 143 02/12/2022   Lab Results  Component Value Date   CHOLHDL 2.7 02/12/2022   Lab Results  Component Value Date   HGBA1C 5.8 (H) 02/12/2022      Assessment & Plan:   Problem List Items Addressed This Visit       Cardiovascular and Mediastinum   Essential hypertension - Primary    BP Readings from Last 1 Encounters:  10/22/22 129/87  Well-controlled with Lisinopril 5 mg QD, has improved diet DC of olmesartan as it was causing dizziness, likely orthostatic hypotension Since cough has resolved now, can continue lisinopril Counseled for compliance with the medications Advised DASH diet and moderate exercise/walking, at least 150 mins/week      Relevant Medications   lisinopril (ZESTRIL) 5 MG tablet   Other Relevant Orders   TSH   CMP14+EGFR   CBC with  Differential/Platelet     Musculoskeletal and Integument   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    On allopurinol Has colchicine as needed for acute flareup Check uric acid level Advised to avoid binge drinking Low purine diet, material provided      Relevant Orders   Uric acid     Other   Mixed hyperlipidemia    On Crestor 10 mg daily Reviewed last lipid profile      Relevant Medications   lisinopril (ZESTRIL) 5 MG tablet   Other Relevant Orders   Lipid panel   Prediabetes    Lab Results  Component Value Date   HGBA1C 5.8 (H) 02/12/2022  Advised to follow DASH diet      Relevant Orders   Hemoglobin A1c   Other Visit Diagnoses     Vitamin D deficiency       Relevant Orders   VITAMIN D 25 Hydroxy (Vit-D Deficiency, Fractures)      Meds ordered this encounter  Medications   lisinopril (ZESTRIL) 5 MG tablet    Sig: Take 1 tablet (5 mg total) by mouth daily.    Dispense:  90 tablet    Refill:  1    Follow-up: Return in about 5 months (around 03/24/2023) for Annual physical.    Anabel Halon, MD

## 2022-10-22 NOTE — Patient Instructions (Addendum)
Please continue taking Lisinopril as prescribed.  Please continue to take other medications as prescribed.  Please continue to follow low salt diet and perform moderate exercise/walking at least 150 mins/week.

## 2022-10-22 NOTE — Assessment & Plan Note (Signed)
On allopurinol Has colchicine as needed for acute flareup Check uric acid level Advised to avoid binge drinking Low purine diet, material provided 

## 2022-10-22 NOTE — Assessment & Plan Note (Addendum)
BP Readings from Last 1 Encounters:  10/22/22 129/87   Well-controlled with Lisinopril 5 mg QD, has improved diet DC of olmesartan as it was causing dizziness, likely orthostatic hypotension Since cough has resolved now, can continue lisinopril Counseled for compliance with the medications Advised DASH diet and moderate exercise/walking, at least 150 mins/week

## 2022-10-22 NOTE — Assessment & Plan Note (Signed)
Lab Results  Component Value Date   HGBA1C 5.8 (H) 02/12/2022   Advised to follow DASH diet

## 2022-10-22 NOTE — Assessment & Plan Note (Signed)
On Crestor 10 mg daily Reviewed last lipid profile 

## 2022-10-26 ENCOUNTER — Other Ambulatory Visit (HOSPITAL_COMMUNITY): Payer: Self-pay

## 2022-11-15 DIAGNOSIS — M5013 Cervical disc disorder with radiculopathy, cervicothoracic region: Secondary | ICD-10-CM | POA: Diagnosis not present

## 2022-11-16 ENCOUNTER — Other Ambulatory Visit: Payer: Self-pay

## 2022-11-16 ENCOUNTER — Other Ambulatory Visit (HOSPITAL_BASED_OUTPATIENT_CLINIC_OR_DEPARTMENT_OTHER): Payer: Self-pay

## 2022-12-26 DIAGNOSIS — H52223 Regular astigmatism, bilateral: Secondary | ICD-10-CM | POA: Diagnosis not present

## 2022-12-26 DIAGNOSIS — H524 Presbyopia: Secondary | ICD-10-CM | POA: Diagnosis not present

## 2022-12-26 DIAGNOSIS — H5213 Myopia, bilateral: Secondary | ICD-10-CM | POA: Diagnosis not present

## 2023-01-17 ENCOUNTER — Encounter: Payer: Self-pay | Admitting: Dermatology

## 2023-01-17 ENCOUNTER — Ambulatory Visit (INDEPENDENT_AMBULATORY_CARE_PROVIDER_SITE_OTHER): Payer: 59 | Admitting: Dermatology

## 2023-01-17 VITALS — BP 145/95 | HR 79

## 2023-01-17 DIAGNOSIS — D492 Neoplasm of unspecified behavior of bone, soft tissue, and skin: Secondary | ICD-10-CM | POA: Diagnosis not present

## 2023-01-17 DIAGNOSIS — L578 Other skin changes due to chronic exposure to nonionizing radiation: Secondary | ICD-10-CM

## 2023-01-17 DIAGNOSIS — D2261 Melanocytic nevi of right upper limb, including shoulder: Secondary | ICD-10-CM | POA: Diagnosis not present

## 2023-01-17 DIAGNOSIS — D229 Melanocytic nevi, unspecified: Secondary | ICD-10-CM

## 2023-01-17 DIAGNOSIS — L814 Other melanin hyperpigmentation: Secondary | ICD-10-CM

## 2023-01-17 DIAGNOSIS — D485 Neoplasm of uncertain behavior of skin: Secondary | ICD-10-CM

## 2023-01-17 DIAGNOSIS — W908XXA Exposure to other nonionizing radiation, initial encounter: Secondary | ICD-10-CM | POA: Diagnosis not present

## 2023-01-17 DIAGNOSIS — Z85820 Personal history of malignant melanoma of skin: Secondary | ICD-10-CM | POA: Diagnosis not present

## 2023-01-17 DIAGNOSIS — L821 Other seborrheic keratosis: Secondary | ICD-10-CM | POA: Diagnosis not present

## 2023-01-17 DIAGNOSIS — Z1283 Encounter for screening for malignant neoplasm of skin: Secondary | ICD-10-CM

## 2023-01-17 DIAGNOSIS — D1801 Hemangioma of skin and subcutaneous tissue: Secondary | ICD-10-CM | POA: Diagnosis not present

## 2023-01-17 NOTE — Patient Instructions (Addendum)

## 2023-01-17 NOTE — Progress Notes (Signed)
New Patient Visit   Subjective  Francisco Johns is a 47 y.o. male who presents for the following:  Total Body Skin Exam (TBSE)  Patient present today for new patient visit for TBSE.The patient denies he  has spots, moles and lesions to be evaluated, some may be new or changing and the patient may have concern these could be cancer. Patient has previously been treated by a dermatologist (Dr. Nita Sells).Patient reports he  has hx of bx and has history of Melanoma in 2015. Patient denies family history of skin cancers. Patient reports throughout his lifetime has had moderate sun exposure. Currently, patient reports if he  has excessive sun exposure, he  does apply sunscreen and/or wears protective coverings.  The following portions of the chart were reviewed this encounter and updated as appropriate: medications, allergies, medical history  Review of Systems:  No other skin or systemic complaints except as noted in HPI or Assessment and Plan.  Objective  Well appearing patient in no apparent distress; mood and affect are within normal limits.  A full examination was performed including scalp, head, eyes, ears, nose, lips, neck, chest, axillae, abdomen, back, buttocks, bilateral upper extremities, bilateral lower extremities, hands, feet, fingers, toes, fingernails, and toenails. All findings within normal limits unless otherwise noted below.   Relevant exam findings are noted in the Assessment and Plan.  Right Shoulder - Anterior 4.5 mm irregular Clonch Macule       Assessment & Plan   LENTIGINES, SEBORRHEIC KERATOSES, HEMANGIOMAS - Benign normal skin lesions - Benign-appearing - Call for any changes  MELANOCYTIC NEVI - Tan-Scarola and/or pink-flesh-colored symmetric macules and papules - Benign appearing on exam today - Observation - Call clinic for new or changing moles - Recommend daily use of broad spectrum spf 30+ sunscreen to sun-exposed areas.   ACTINIC DAMAGE - Chronic  condition, secondary to cumulative UV/sun exposure - diffuse scaly erythematous macules with underlying dyspigmentation - Recommend daily broad spectrum sunscreen SPF 30+ to sun-exposed areas, reapply every 2 hours as needed.  - Staying in the shade or wearing long sleeves, sun glasses (UVA+UVB protection) and wide brim hats (4-inch brim around the entire circumference of the hat) are also recommended for sun protection.  - Call for new or changing lesions.  HISTORY OF MELANOMA (posterior neck) - No evidence of recurrence today - No lymphadenopathy - Recommend regular full body skin exams - Recommend daily broad spectrum sunscreen SPF 30+ to sun-exposed areas, reapply every 2 hours as needed.  - Call if any new or changing lesions are noted between office visits   Neoplasm of uncertain behavior of skin Right Shoulder - Anterior  Skin / nail biopsy Type of biopsy: tangential   Informed consent: discussed and consent obtained   Timeout: patient name, date of birth, surgical site, and procedure verified   Procedure prep:  Patient was prepped and draped in usual sterile fashion Prep type:  Isopropyl alcohol Anesthesia: the lesion was anesthetized in a standard fashion   Anesthetic:  1% lidocaine w/ epinephrine 1-100,000 buffered w/ 8.4% NaHCO3 Instrument used: DermaBlade   Hemostasis achieved with: aluminum chloride   Outcome: patient tolerated procedure well   Post-procedure details: sterile dressing applied and wound care instructions given   Dressing type: petrolatum gauze and bandage    Specimen 1 - Surgical pathology Differential Diagnosis: DN  Check Margins: No  SKIN CANCER SCREENING PERFORMED TODAY  Return in about 1 year (around 01/17/2024) for TBSE.  Documentation: I have reviewed  the above documentation for accuracy and completeness, and I agree with the above.  Stasia Cavalier, am acting as scribe for Langston Reusing, DO.  Langston Reusing, DO

## 2023-01-18 LAB — SURGICAL PATHOLOGY

## 2023-03-15 ENCOUNTER — Other Ambulatory Visit: Payer: Self-pay | Admitting: Internal Medicine

## 2023-03-15 ENCOUNTER — Other Ambulatory Visit (HOSPITAL_BASED_OUTPATIENT_CLINIC_OR_DEPARTMENT_OTHER): Payer: Self-pay

## 2023-03-15 ENCOUNTER — Other Ambulatory Visit: Payer: Self-pay

## 2023-03-15 DIAGNOSIS — E782 Mixed hyperlipidemia: Secondary | ICD-10-CM

## 2023-03-15 DIAGNOSIS — M1A9XX0 Chronic gout, unspecified, without tophus (tophi): Secondary | ICD-10-CM

## 2023-03-18 ENCOUNTER — Other Ambulatory Visit (HOSPITAL_BASED_OUTPATIENT_CLINIC_OR_DEPARTMENT_OTHER): Payer: Self-pay

## 2023-03-18 MED ORDER — ROSUVASTATIN CALCIUM 10 MG PO TABS
10.0000 mg | ORAL_TABLET | Freq: Every day | ORAL | 3 refills | Status: DC
  Filled 2023-03-18: qty 90, 90d supply, fill #0
  Filled 2023-07-01: qty 30, 30d supply, fill #1
  Filled 2023-08-09: qty 30, 30d supply, fill #2
  Filled 2023-09-09: qty 30, 30d supply, fill #3
  Filled 2023-10-08: qty 30, 30d supply, fill #4
  Filled 2023-11-08: qty 30, 30d supply, fill #5
  Filled 2023-12-07: qty 30, 30d supply, fill #6
  Filled 2024-01-10: qty 30, 30d supply, fill #7
  Filled 2024-02-09: qty 30, 30d supply, fill #8
  Filled 2024-03-06: qty 30, 30d supply, fill #9

## 2023-03-18 MED ORDER — ALLOPURINOL 100 MG PO TABS
100.0000 mg | ORAL_TABLET | Freq: Every day | ORAL | 1 refills | Status: DC
Start: 2023-03-18 — End: 2023-03-28
  Filled 2023-03-18: qty 90, 90d supply, fill #0

## 2023-03-20 ENCOUNTER — Other Ambulatory Visit (HOSPITAL_BASED_OUTPATIENT_CLINIC_OR_DEPARTMENT_OTHER): Payer: Self-pay

## 2023-03-28 ENCOUNTER — Ambulatory Visit (INDEPENDENT_AMBULATORY_CARE_PROVIDER_SITE_OTHER): Payer: 59 | Admitting: Internal Medicine

## 2023-03-28 ENCOUNTER — Encounter: Payer: Self-pay | Admitting: Internal Medicine

## 2023-03-28 ENCOUNTER — Other Ambulatory Visit (HOSPITAL_COMMUNITY): Payer: Self-pay

## 2023-03-28 VITALS — BP 130/87 | HR 76 | Ht 68.0 in | Wt 187.2 lb

## 2023-03-28 DIAGNOSIS — M1A9XX0 Chronic gout, unspecified, without tophus (tophi): Secondary | ICD-10-CM | POA: Diagnosis not present

## 2023-03-28 DIAGNOSIS — E782 Mixed hyperlipidemia: Secondary | ICD-10-CM | POA: Diagnosis not present

## 2023-03-28 DIAGNOSIS — Z0001 Encounter for general adult medical examination with abnormal findings: Secondary | ICD-10-CM | POA: Diagnosis not present

## 2023-03-28 DIAGNOSIS — R7303 Prediabetes: Secondary | ICD-10-CM

## 2023-03-28 DIAGNOSIS — K219 Gastro-esophageal reflux disease without esophagitis: Secondary | ICD-10-CM

## 2023-03-28 DIAGNOSIS — I10 Essential (primary) hypertension: Secondary | ICD-10-CM

## 2023-03-28 DIAGNOSIS — E559 Vitamin D deficiency, unspecified: Secondary | ICD-10-CM | POA: Diagnosis not present

## 2023-03-28 DIAGNOSIS — Z85820 Personal history of malignant melanoma of skin: Secondary | ICD-10-CM | POA: Diagnosis not present

## 2023-03-28 MED ORDER — ALLOPURINOL 100 MG PO TABS
100.0000 mg | ORAL_TABLET | Freq: Every day | ORAL | 3 refills | Status: DC
  Filled 2023-03-28: qty 90, 90d supply, fill #0
  Filled 2023-07-01: qty 30, 30d supply, fill #0
  Filled 2023-08-09: qty 30, 30d supply, fill #1
  Filled 2023-09-09: qty 30, 30d supply, fill #2
  Filled 2023-10-08: qty 30, 30d supply, fill #3
  Filled 2023-11-08: qty 30, 30d supply, fill #4
  Filled 2023-12-07: qty 30, 30d supply, fill #5
  Filled 2024-01-10: qty 30, 30d supply, fill #6
  Filled 2024-02-09: qty 30, 30d supply, fill #7
  Filled 2024-03-06: qty 30, 30d supply, fill #8

## 2023-03-28 MED ORDER — LISINOPRIL 5 MG PO TABS
5.0000 mg | ORAL_TABLET | Freq: Every day | ORAL | 1 refills | Status: DC
Start: 2023-03-28 — End: 2024-01-10
  Filled 2023-03-28: qty 90, 90d supply, fill #0
  Filled 2023-07-01: qty 30, 30d supply, fill #0
  Filled 2023-08-09: qty 30, 30d supply, fill #1
  Filled 2023-09-09: qty 30, 30d supply, fill #2
  Filled 2023-10-08: qty 30, 30d supply, fill #3
  Filled 2023-11-08: qty 30, 30d supply, fill #4
  Filled 2023-12-07: qty 30, 30d supply, fill #5

## 2023-03-28 NOTE — Progress Notes (Signed)
Established Patient Office Visit  Subjective:  Patient ID: Francisco Johns, male    DOB: [birth year]  Age: 47 y.o. MRN: [id number]  CC:  Chief Complaint  Patient presents with   Annual Exam    HPI Francisco Johns is a 47 y.o. male with past medical history of HTN, HLD, melanoma and umbilical hernia s/p repair who presents for annual physical.  HTN: BP is wnl today. He reports wnl blood pressure readings at home as well, around 130s/80s. Patient denies headache, dizziness, chest pain, dyspnea or palpitations.   His cholesterol profile has been better with Crestor.  Gout: Denies any recent acute flareup of gout since starting allopurinol.   He reports mild epigastric pain and acid reflux, which usually improves with Tums.  Denies any nausea or vomiting.    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,R  Tenolysis and 5th FDS repair 08/06/17     UMBILICAL HERNIA REPAIR N/A 12/31/2019   Procedure: HERNIA REPAIR UMBILICAL WITH MESH;  Surgeon: Harriette Bouillon, MD;  Location: [facility];  Service: General;  Laterality: N/A;    Family History  Problem Relation Age of Onset   Colon cancer Neg Hx    Esophageal cancer Neg Hx    Rectal cancer Neg Hx    Stomach cancer Neg Hx     Social History   Socioeconomic History   Marital status: Married    Spouse name: Not on file   Number of children: Not on file   Years of education: Not on file   Highest education level: Not on file  Occupational History   Not on  file  Tobacco Use   Smoking status: Never    Passive exposure: Never   Smokeless tobacco: Never  Vaping Use   Vaping status: Never Used  Substance and Sexual Activity   Alcohol use: No    Comment: social   Drug use: No   Sexual activity: Not on file  Other Topics Concern   Not on file  Social History Narrative   ** Merged History Encounter **       Social Determinants of Health   Financial Resource Strain: Not on file  Food Insecurity: Not on file  Transportation Needs: Not on file  Physical Activity: Not on file  Stress: Not on file  Social Connections: Not on file  Intimate Partner Violence: Not on file    Outpatient Medications Prior to Visit  Medication Sig Dispense Refill   rosuvastatin (CRESTOR) 10 MG tablet Take 1 tablet (10 mg total) by mouth daily. 90 tablet 3   allopurinol (ZYLOPRIM) 100 MG tablet Take 1 tablet (100 mg total) by mouth daily. 90 tablet 1   lisinopril (ZESTRIL) 5 MG tablet Take 1 tablet (5 mg total) by mouth daily. 90 tablet 1   No facility-administered medications prior to visit.    No Known Allergies  ROS Review of Systems  Constitutional:  Negative for chills and fever.  HENT:  Negative for congestion and sore  throat.   Eyes:  Negative for pain and discharge.  Respiratory:  Negative for cough and shortness of breath.   Cardiovascular:  Negative for chest pain and palpitations.  Gastrointestinal:  Negative for constipation, diarrhea, nausea and vomiting.  Endocrine: Negative for polydipsia and polyuria.  Genitourinary:  Negative for dysuria and hematuria.  Musculoskeletal:  Negative for neck pain and neck stiffness.  Skin:  Negative for rash.  Neurological:  Negative for dizziness, weakness, numbness and headaches.  Psychiatric/Behavioral:  Negative for agitation and behavioral problems.       Objective:    Physical Exam Vitals reviewed.  Constitutional:      General: He is not in acute distress.    Appearance: He is not  diaphoretic.  HENT:     Head: Normocephalic and atraumatic.     Nose: Nose normal.     Mouth/Throat:     Mouth: Mucous membranes are moist.  Eyes:     General: No scleral icterus.    Extraocular Movements: Extraocular movements intact.  Neck:     Vascular: No carotid bruit.  Cardiovascular:     Rate and Rhythm: Normal rate and regular rhythm.     Pulses: Normal pulses.     Heart sounds: Normal heart sounds. No murmur heard. Pulmonary:     Breath sounds: Normal breath sounds. No wheezing or rales.  Abdominal:     Palpations: Abdomen is soft.     Tenderness: There is no abdominal tenderness.  Musculoskeletal:     Cervical back: Neck supple. No tenderness.     Right lower leg: No edema.     Left lower leg: No edema.  Skin:    General: Skin is warm.     Findings: Lesion (Old surgical scar over right forearm, C/D/I) present. No rash.  Neurological:     General: No focal deficit present.     Mental Status: He is alert and oriented to person, place, and time.     Cranial Nerves: No cranial nerve deficit.     Sensory: No sensory deficit.     Motor: No weakness.  Psychiatric:        Mood and Affect: Mood normal.        Behavior: Behavior normal.     BP 130/87 (BP Location: Right Arm, Patient Position: Sitting, Cuff Size: Normal)   Pulse 76   Ht 5\' 8"  (1.727 m)   Wt 187 lb 3.2 oz (84.9 kg)   SpO2 94%   BMI 28.46 kg/m  Wt Readings from Last 3 Encounters:  03/28/23 187 lb 3.2 oz (84.9 kg)  10/22/22 179 lb 12.8 oz (81.6 kg)  08/20/22 183 lb 9.6 oz (83.3 kg)    Lab Results  Component Value Date   TSH 1.860 02/12/2022   Lab Results  Component Value Date   WBC 5.3 02/12/2022   HGB 14.2 02/12/2022   HCT 43.0 02/12/2022   MCV 92 02/12/2022   PLT 255 02/12/2022   Lab Results  Component Value Date   NA 143 08/20/2022   K 4.7 08/20/2022   CO2 21 08/20/2022   GLUCOSE 88 08/20/2022   BUN 15 08/20/2022   CREATININE 0.93 08/20/2022   BILITOT 0.3 02/12/2022   ALKPHOS 80  02/12/2022   AST 33 02/12/2022   ALT 43 02/12/2022   PROT 7.5 02/12/2022   ALBUMIN 5.0 02/12/2022   CALCIUM 9.9 08/20/2022   EGFR 102 08/20/2022   Lab Results  Component Value Date   CHOL 152 02/12/2022  Lab Results  Component Value Date   HDL 56 02/12/2022   Lab Results  Component Value Date   LDLCALC 71 02/12/2022   Lab Results  Component Value Date   TRIG 143 02/12/2022   Lab Results  Component Value Date   CHOLHDL 2.7 02/12/2022   Lab Results  Component Value Date   HGBA1C 5.8 (H) 02/12/2022      Assessment & Plan:   Problem List Items Addressed This Visit       Cardiovascular and Mediastinum   Essential hypertension    BP Readings from Last 1 Encounters:  03/28/23 130/87   Well-controlled with Lisinopril 5 mg QD, has improved diet Since cough has resolved now, can continue lisinopril Counseled for compliance with the medications Advised DASH diet and moderate exercise/walking, at least 150 mins/week      Relevant Medications   lisinopril (ZESTRIL) 5 MG tablet     Digestive   Gastroesophageal reflux disease    Epigastric pain could be due to gastritis/GERD Advised to take Pepcid PRN        Musculoskeletal and Integument   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 Fasting blood tests today.      Prediabetes    Lab Results  Component Value Date   HGBA1C 5.8 (H) 02/12/2022   Advised to follow DASH diet        Meds ordered this encounter  Medications   lisinopril (ZESTRIL) 5 MG tablet    Sig: Take 1 tablet (5 mg total) by mouth daily.    Dispense:  90 tablet    Refill:  1   allopurinol (ZYLOPRIM) 100 MG tablet    Sig: Take 1 tablet (100 mg total) by mouth daily.    Dispense:  90 tablet    Refill:  3    Follow-up: Return in about 6 months (around 09/26/2023) for HTN and HLD.    Anabel Halon, MD

## 2023-03-28 NOTE — Assessment & Plan Note (Addendum)
On Crestor 10 mg daily Check lipid profile

## 2023-03-28 NOTE — Patient Instructions (Signed)
Please continue to take medications as prescribed.  Please continue to follow DASH diet and perform moderate exercise/walking at least 150 mins/week. 

## 2023-03-28 NOTE — Assessment & Plan Note (Signed)
BP Readings from Last 1 Encounters:  03/28/23 130/87   Well-controlled with Lisinopril 5 mg QD, has improved diet Since cough has resolved now, can continue lisinopril Counseled for compliance with the medications Advised DASH diet and moderate exercise/walking, at least 150 mins/week

## 2023-03-28 NOTE — Assessment & Plan Note (Signed)
Followed by Dermatology 

## 2023-03-28 NOTE — Assessment & Plan Note (Signed)
Physical exam as documented. Fasting blood tests today. 

## 2023-03-28 NOTE — Assessment & Plan Note (Signed)
Lab Results  Component Value Date   HGBA1C 5.8 (H) 02/12/2022   Advised to follow DASH diet

## 2023-03-28 NOTE — Assessment & Plan Note (Signed)
Epigastric pain could be due to gastritis/GERD Advised to take Pepcid PRN

## 2023-03-28 NOTE — Assessment & Plan Note (Signed)
On allopurinol Has colchicine as needed for acute flareup Check uric acid level Advised to avoid binge drinking Low purine diet, material provided

## 2023-03-29 LAB — CMP14+EGFR
ALT: 45 [IU]/L — ABNORMAL HIGH (ref 0–44)
AST: 33 [IU]/L (ref 0–40)
Albumin: 4.9 g/dL (ref 4.1–5.1)
Alkaline Phosphatase: 78 [IU]/L (ref 44–121)
BUN/Creatinine Ratio: 12 (ref 9–20)
BUN: 11 mg/dL (ref 6–24)
Bilirubin Total: 0.6 mg/dL (ref 0.0–1.2)
CO2: 24 mmol/L (ref 20–29)
Calcium: 9.8 mg/dL (ref 8.7–10.2)
Chloride: 103 mmol/L (ref 96–106)
Creatinine, Ser: 0.9 mg/dL (ref 0.76–1.27)
Globulin, Total: 2.1 g/dL (ref 1.5–4.5)
Glucose: 101 mg/dL — ABNORMAL HIGH (ref 70–99)
Potassium: 4.9 mmol/L (ref 3.5–5.2)
Sodium: 144 mmol/L (ref 134–144)
Total Protein: 7 g/dL (ref 6.0–8.5)
eGFR: 106 mL/min/{1.73_m2} (ref >59)

## 2023-03-29 LAB — LIPID PANEL
Chol/HDL Ratio: 3.7 {ratio} (ref 0.0–5.0)
Cholesterol, Total: 202 mg/dL — ABNORMAL HIGH (ref 100–199)
HDL: 54 mg/dL (ref >39)
LDL Chol Calc (NIH): 131 mg/dL — ABNORMAL HIGH (ref 0–99)
Triglycerides: 92 mg/dL (ref 0–149)
VLDL Cholesterol Cal: 17 mg/dL (ref 5–40)

## 2023-03-29 LAB — CBC WITH DIFFERENTIAL/PLATELET
Basophils Absolute: 0 10*3/uL (ref 0.0–0.2)
Basos: 1 %
EOS (ABSOLUTE): 0.1 10*3/uL (ref 0.0–0.4)
Eos: 3 %
Hematocrit: 42.1 % (ref 37.5–51.0)
Hemoglobin: 13.7 g/dL (ref 13.0–17.7)
Immature Grans (Abs): 0 10*3/uL (ref 0.0–0.1)
Immature Granulocytes: 0 %
Lymphocytes Absolute: 1.8 10*3/uL (ref 0.7–3.1)
Lymphs: 43 %
MCH: 30.4 pg (ref 26.6–33.0)
MCHC: 32.5 g/dL (ref 31.5–35.7)
MCV: 94 fL (ref 79–97)
Monocytes Absolute: 0.5 10*3/uL (ref 0.1–0.9)
Monocytes: 11 %
Neutrophils Absolute: 1.8 10*3/uL (ref 1.4–7.0)
Neutrophils: 42 %
Platelets: 233 10*3/uL (ref 150–450)
RBC: 4.5 x10E6/uL (ref 4.14–5.80)
RDW: 12.8 % (ref 11.6–15.4)
WBC: 4.3 10*3/uL (ref 3.4–10.8)

## 2023-03-29 LAB — URIC ACID: Uric Acid: 6 mg/dL (ref 3.8–8.4)

## 2023-03-29 LAB — HEMOGLOBIN A1C
Est. average glucose Bld gHb Est-mCnc: 117 mg/dL
Hgb A1c MFr Bld: 5.7 % — ABNORMAL HIGH (ref 4.8–5.6)

## 2023-03-29 LAB — VITAMIN D 25 HYDROXY (VIT D DEFICIENCY, FRACTURES): Vit D, 25-Hydroxy: 27.5 ng/mL — ABNORMAL LOW (ref 30.0–100.0)

## 2023-03-29 LAB — TSH: TSH: 1.62 u[IU]/mL (ref 0.450–4.500)

## 2023-07-01 ENCOUNTER — Other Ambulatory Visit (HOSPITAL_BASED_OUTPATIENT_CLINIC_OR_DEPARTMENT_OTHER): Payer: Self-pay

## 2023-07-17 ENCOUNTER — Ambulatory Visit: Payer: 59 | Admitting: Dermatology

## 2023-08-09 ENCOUNTER — Other Ambulatory Visit (HOSPITAL_BASED_OUTPATIENT_CLINIC_OR_DEPARTMENT_OTHER): Payer: Self-pay

## 2023-08-12 ENCOUNTER — Encounter: Payer: Self-pay | Admitting: Dermatology

## 2023-08-12 ENCOUNTER — Ambulatory Visit (INDEPENDENT_AMBULATORY_CARE_PROVIDER_SITE_OTHER): Admitting: Dermatology

## 2023-08-12 VITALS — BP 161/104

## 2023-08-12 DIAGNOSIS — D1801 Hemangioma of skin and subcutaneous tissue: Secondary | ICD-10-CM

## 2023-08-12 DIAGNOSIS — L918 Other hypertrophic disorders of the skin: Secondary | ICD-10-CM

## 2023-08-12 DIAGNOSIS — L814 Other melanin hyperpigmentation: Secondary | ICD-10-CM | POA: Diagnosis not present

## 2023-08-12 DIAGNOSIS — Z1283 Encounter for screening for malignant neoplasm of skin: Secondary | ICD-10-CM

## 2023-08-12 DIAGNOSIS — D229 Melanocytic nevi, unspecified: Secondary | ICD-10-CM

## 2023-08-12 DIAGNOSIS — W908XXA Exposure to other nonionizing radiation, initial encounter: Secondary | ICD-10-CM

## 2023-08-12 DIAGNOSIS — L578 Other skin changes due to chronic exposure to nonionizing radiation: Secondary | ICD-10-CM

## 2023-08-12 DIAGNOSIS — D171 Benign lipomatous neoplasm of skin and subcutaneous tissue of trunk: Secondary | ICD-10-CM

## 2023-08-12 DIAGNOSIS — Z86018 Personal history of other benign neoplasm: Secondary | ICD-10-CM

## 2023-08-12 DIAGNOSIS — L821 Other seborrheic keratosis: Secondary | ICD-10-CM

## 2023-08-12 NOTE — Progress Notes (Signed)
 Total Body Skin Exam (TBSE) Visit   Subjective  Francisco Johns is a 48 y.o. male who presents for the following: Skin Cancer Screening and Full Body Skin Exam  Patient presents today for follow up visit for TBSE. Patient was last evaluated on 01/17/23 . Patient denies medication changes. Patient reports he  does not have spots, moles and lesions of concern to be evaluated. Patient reports throughout his lifetime he  has had complete sun exposure. Currently, patient reports if he  has excessive sun exposure, he  does apply sunscreen and/or wears protective coverings. Patient reports he  has hx of bx. Patient denies  family history of skin cancers. The patient has spots, moles and lesions to be evaluated, some may be new or changing and the patient has concerns that these could be cancer.  The following portions of the chart were reviewed this encounter and updated as appropriate: medications, allergies, medical history  Review of Systems:  No other skin or systemic complaints except as noted in HPI or Assessment and Plan.  Objective  Well appearing patient in no apparent distress; mood and affect are within normal limits.  A full examination was performed including scalp, head, eyes, ears, nose, lips, neck, chest, axillae, abdomen, back, buttocks, bilateral upper extremities, bilateral lower extremities, hands, feet, fingers, toes, fingernails, and toenails. All findings within normal limits unless otherwise noted below.   Relevant physical exam findings are noted in the Assessment and Plan.  Left Axilla (3)   Assessment & Plan   LENTIGINES, HEMANGIOMAS - Benign normal skin lesions - Benign-appearing - Call for any changes  BENIGN MELANOCYTIC NEVI - Tan-Salminen and/or pink-flesh-colored symmetric macules and papules - Benign appearing on exam today - Observation - Call clinic for new or changing moles - Recommend daily use of broad spectrum spf 30+ sunscreen to sun-exposed areas.    MODERATE ACTINIC DAMAGE - Chronic condition, secondary to cumulative UV/sun exposure - diffuse scaly erythematous macules with underlying dyspigmentation - Recommend daily broad spectrum sunscreen SPF 30+ to sun-exposed areas, reapply every 2 hours as needed.  - Staying in the shade or wearing long sleeves, sun glasses (UVA+UVB protection) and wide brim hats (4-inch brim around the entire circumference of the hat) are also recommended for sun protection.  - Call for new or changing lesions.  Lipoma  Exam: 5cm x 3.5cm soft cutaneous fatty nodule(s) Location: Lower back   Benign-appearing. Exam most consistent with a lipoma. Discussed that a lipoma is a benign fatty growth that can grow over time and sometimes get irritated. Recommend observation if it is not bothersome or changing. Discussed option of ILK injections or surgical excision to remove it if it is growing, symptomatic, or other changes noted. Please call for new or changing lesions so they can be evaluated.   History of Dysplastic Nevi (right anterior shoulder) - No evidence of recurrence today - Recommend regular full body skin exams - Recommend daily broad spectrum sunscreen SPF 30+ to sun-exposed areas, reapply every 2 hours as needed.  - Call if any new or changing lesions are noted between office visits  Well healed scar on R anterior shoulder  SKIN CANCER SCREENING PERFORMED TODAY.  INFLAMED SKIN TAG (3) Left Axilla (3) Destruction of lesion - Left Axilla (3) Complexity: simple   Destruction method: cryotherapy   Informed consent: discussed and consent obtained   Timeout:  patient name, date of birth, surgical site, and procedure verified Lesion destroyed using liquid nitrogen: Yes   Region  frozen until ice ball extended beyond lesion: Yes   Outcome: patient tolerated procedure well with no complications   Post-procedure details: wound care instructions given   No follow-ups on file.    Documentation: I have  reviewed the above documentation for accuracy and completeness, and I agree with the above.   I, Shirron Louanne Roussel, CMA, am acting as scribe for [employer], DO.   Louana Roup, DO

## 2023-08-12 NOTE — Patient Instructions (Addendum)
 Actinic keratoses are precancerous spots that appear secondary to cumulative UV radiation exposure/sun exposure over time. They are chronic with expected duration over 1 year. A portion of actinic keratoses will progress to squamous cell carcinoma of the skin. It is not possible to reliably predict which spots will progress to skin cancer and so treatment is recommended to prevent development of skin cancer.  Recommend daily broad spectrum sunscreen SPF 30+ to sun-exposed areas, reapply every 2 hours as needed.  Recommend staying in the shade or wearing long sleeves, sun glasses (UVA+UVB protection) and wide brim hats (4-inch brim around the entire circumference of the hat). Call for new or changing lesions.  Prior to procedure, discussed risks of blister formation, small wound, skin dyspigmentation, or rare scar following cryotherapy. Recommend Vaseline ointment to treated areas while healing.    Important Information  Due to recent changes in healthcare laws, you may see results of your pathology and/or laboratory studies on MyChart before the doctors have had a chance to review them. We understand that in some cases there may be results that are confusing or concerning to you. Please understand that not all results are received at the same time and often the doctors may need to interpret multiple results in order to provide you with the best plan of care or course of treatment. Therefore, we ask that you please give us  2 business days to thoroughly review all your results before contacting the office for clarification. Should we see a critical lab result, you will be contacted sooner.   If You Need Anything After Your Visit  If you have any questions or concerns for your doctor, please call our main line at [phone] If no one answers, please leave a voicemail as directed and we will return your call as soon as possible. Messages left after 4 pm will be answered the following business day.    You may also send us  a message via MyChart. We typically respond to MyChart messages within 1-2 business days.  For prescription refills, please ask your pharmacy to contact our office. Our fax number is [phone].  If you have an urgent issue when the clinic is closed that cannot wait until the next business day, you can page your doctor at the number below.    Please note that while we do our best to be available for urgent issues outside of office hours, we are not available 24/7.   If you have an urgent issue and are unable to reach us , you may choose to seek medical care at your doctor's office, retail clinic, urgent care center, or emergency room.  If you have a medical emergency, please immediately call 911 or go to the emergency department. In the event of inclement weather, please call our main line at [phone] for an update on the status of any delays or closures.  Dermatology Medication Tips: Please keep the boxes that topical medications come in in order to help keep track of the instructions about where and how to use these. Pharmacies typically print the medication instructions only on the boxes and not directly on the medication tubes.   If your medication is too expensive, please contact our office at [phone] or send us  a message through MyChart.   We are unable to tell what your co-pay for medications will be in advance as this is different depending on your insurance coverage. However, we may be able to find a substitute medication at lower cost or fill out paperwork  to get insurance to cover a needed medication.   If a prior authorization is required to get your medication covered by your insurance company, please allow us  1-2 business days to complete this process.  Drug prices often vary depending on where the prescription is filled and some pharmacies may offer cheaper prices.  The website www.goodrx.com contains coupons for medications through different  pharmacies. The prices here do not account for what the cost may be with help from insurance (it may be cheaper with your insurance), but the website can give you the price if you did not use any insurance.  - You can print the associated coupon and take it with your prescription to the pharmacy.  - You may also stop by our office during regular business hours and pick up a GoodRx coupon card.  - If you need your prescription sent electronically to a different pharmacy, notify our office through [facility] or by phone at [phone]

## 2023-10-04 ENCOUNTER — Ambulatory Visit: Payer: 59 | Admitting: Internal Medicine

## 2023-11-07 ENCOUNTER — Ambulatory Visit: Admitting: Internal Medicine

## 2023-11-08 ENCOUNTER — Other Ambulatory Visit: Payer: Self-pay

## 2023-11-08 ENCOUNTER — Other Ambulatory Visit (HOSPITAL_BASED_OUTPATIENT_CLINIC_OR_DEPARTMENT_OTHER): Payer: Self-pay

## 2023-11-08 MED ORDER — CIPROFLOXACIN-DEXAMETHASONE 0.3-0.1 % OT SUSP
4.0000 [drp] | Freq: Two times a day (BID) | OTIC | 0 refills | Status: AC
  Filled 2023-11-08: qty 7.5, 19d supply, fill #0

## 2023-12-19 ENCOUNTER — Ambulatory Visit: Admitting: Internal Medicine

## 2024-01-10 ENCOUNTER — Other Ambulatory Visit: Payer: Self-pay

## 2024-01-10 ENCOUNTER — Other Ambulatory Visit (HOSPITAL_BASED_OUTPATIENT_CLINIC_OR_DEPARTMENT_OTHER): Payer: Self-pay

## 2024-01-10 ENCOUNTER — Other Ambulatory Visit: Payer: Self-pay | Admitting: Internal Medicine

## 2024-01-10 DIAGNOSIS — I10 Essential (primary) hypertension: Secondary | ICD-10-CM

## 2024-01-10 MED ORDER — LISINOPRIL 5 MG PO TABS
5.0000 mg | ORAL_TABLET | Freq: Every day | ORAL | 1 refills | Status: AC
  Filled 2024-01-10: qty 30, 30d supply, fill #0
  Filled 2024-02-09: qty 30, 30d supply, fill #1
  Filled 2024-03-06: qty 30, 30d supply, fill #2
  Filled 2024-04-14: qty 30, 30d supply, fill #3
  Filled 2024-05-12: qty 30, 30d supply, fill #4

## 2024-01-23 ENCOUNTER — Ambulatory Visit (INDEPENDENT_AMBULATORY_CARE_PROVIDER_SITE_OTHER): Admitting: Internal Medicine

## 2024-01-23 ENCOUNTER — Encounter: Payer: Self-pay | Admitting: Internal Medicine

## 2024-01-23 VITALS — BP 131/85 | HR 75 | Ht 68.0 in | Wt 177.2 lb

## 2024-01-23 DIAGNOSIS — R7303 Prediabetes: Secondary | ICD-10-CM

## 2024-01-23 DIAGNOSIS — I10 Essential (primary) hypertension: Secondary | ICD-10-CM | POA: Diagnosis not present

## 2024-01-23 DIAGNOSIS — E782 Mixed hyperlipidemia: Secondary | ICD-10-CM

## 2024-01-23 DIAGNOSIS — K219 Gastro-esophageal reflux disease without esophagitis: Secondary | ICD-10-CM | POA: Diagnosis not present

## 2024-01-23 DIAGNOSIS — M1A9XX0 Chronic gout, unspecified, without tophus (tophi): Secondary | ICD-10-CM

## 2024-01-23 DIAGNOSIS — Z0001 Encounter for general adult medical examination with abnormal findings: Secondary | ICD-10-CM

## 2024-01-23 DIAGNOSIS — E559 Vitamin D deficiency, unspecified: Secondary | ICD-10-CM

## 2024-01-23 NOTE — Progress Notes (Signed)
 Established Patient Office Visit  Subjective:  Patient ID: Francisco Johns, male    DOB: 10-18-1975  Age: 48 y.o. MRN: [id number]  CC:  Chief Complaint  Patient presents with   Hypertension    6 month f/u    Hyperlipidemia    6 month f/u      HPI [name] is a 48 y.o. male with past medical history of HTN, HLD, melanoma and umbilical hernia s/p repair who presents for f/u of his chronic medical conditions.  HTN: BP is wnl today. He reports wnl blood pressure readings at home as well, around 130s/80s. Patient denies headache, dizziness, chest pain, dyspnea or palpitations.   His cholesterol profile showed elevated LDL in 12/24, but has restarted taking Crestor  since then.  Gout: Denies any recent acute flareup of gout since starting allopurinol .    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/06/17     UMBILICAL HERNIA REPAIR N/A 12/31/2019   Procedure: HERNIA REPAIR UMBILICAL WITH MESH;  Surgeon: Vanderbilt Ned, MD;  Location: [facility];  Service: General;  Laterality: N/A;    Family History  Problem Relation Age of Onset   Colon cancer Neg Hx    Esophageal cancer Neg Hx    Rectal cancer Neg Hx    Stomach cancer Neg Hx     Social History   Socioeconomic History   Marital status: Married    Spouse name: Not on file   Number of children: Not on file   Years of education: Not on file   Highest education level: Not on file  Occupational History   Not on  file  Tobacco Use   Smoking status: Never    Passive exposure: Never   Smokeless tobacco: Never  Vaping Use   Vaping status: Never Used  Substance and Sexual Activity   Alcohol use: No    Comment: social   Drug use: No   Sexual activity: Not on file  Other Topics Concern   Not on file  Social History Narrative   ** Merged History Encounter **       Social Drivers of Health   Financial Resource Strain: Not on file  Food Insecurity: Not on file  Transportation Needs: Not on file  Physical Activity: Not on file  Stress: Not on file  Social Connections: Not on file  Intimate Partner Violence: Not on file    Outpatient Medications Prior to Visit  Medication Sig Dispense Refill   allopurinol  (ZYLOPRIM ) 100 MG tablet Take 1 tablet (100 mg total) by mouth daily. 90 tablet 3   lisinopril  (ZESTRIL ) 5 MG tablet Take 1 tablet (5 mg total) by mouth daily. 90 tablet 1   rosuvastatin  (CRESTOR ) 10 MG tablet Take 1 tablet (10 mg total) by mouth daily. 90 tablet 3   No facility-administered medications prior to visit.    No Known Allergies  ROS Review of Systems  Constitutional:  Negative for chills and fever.  HENT:  Negative for congestion and sore throat.   Eyes:  Negative for pain and discharge.  Respiratory:  Negative for cough and shortness of breath.   Cardiovascular:  Negative for chest pain and palpitations.  Gastrointestinal:  Negative for constipation, diarrhea, nausea and vomiting.  Endocrine: Negative for polydipsia and polyuria.  Genitourinary:  Negative for dysuria and hematuria.  Musculoskeletal:  Negative for neck pain and neck stiffness.  Skin:  Negative for rash.  Neurological:  Negative for dizziness, weakness, numbness and headaches.  Psychiatric/Behavioral:  Negative for agitation and behavioral problems.       Objective:    Physical Exam Vitals reviewed.  Constitutional:      General: He is not in acute distress.    Appearance: He is not  diaphoretic.  HENT:     Head: Normocephalic and atraumatic.     Nose: Nose normal.     Mouth/Throat:     Mouth: Mucous membranes are moist.  Eyes:     General: No scleral icterus.    Extraocular Movements: Extraocular movements intact.  Neck:     Vascular: No carotid bruit.  Cardiovascular:     Rate and Rhythm: Normal rate and regular rhythm.     Pulses: Normal pulses.     Heart sounds: Normal heart sounds. No murmur heard. Pulmonary:     Breath sounds: Normal breath sounds. No wheezing or rales.  Abdominal:     Palpations: Abdomen is soft.     Tenderness: There is no abdominal tenderness.  Musculoskeletal:     Cervical back: Neck supple. No tenderness.     Right lower leg: No edema.     Left lower leg: No edema.  Skin:    General: Skin is warm.     Findings: Lesion (Old surgical scar over right forearm, C/D/I) present. No rash.  Neurological:     General: No focal deficit present.     Mental Status: He is alert and oriented to person, place, and time.     Cranial Nerves: No cranial nerve deficit.     Sensory: No sensory deficit.     Motor: No weakness.  Psychiatric:        Mood and Affect: Mood normal.        Behavior: Behavior normal.     BP 131/85   Pulse 75   Ht 5' 8 (1.727 m)   Wt 177 lb 3.2 oz (80.4 kg)   SpO2 99%   BMI 26.94 kg/m  Wt Readings from Last 3 Encounters:  01/23/24 177 lb 3.2 oz (80.4 kg)  03/28/23 187 lb 3.2 oz (84.9 kg)  10/22/22 179 lb 12.8 oz (81.6 kg)    Lab Results  Component Value Date   TSH 1.620 03/28/2023   Lab Results  Component Value Date   WBC 4.3 03/28/2023   HGB 13.7 03/28/2023   HCT 42.1 03/28/2023   MCV 94 03/28/2023   PLT 233 03/28/2023   Lab Results  Component Value Date   NA 144 03/28/2023   K 4.9 03/28/2023   CO2 24 03/28/2023   GLUCOSE 101 (H) 03/28/2023   BUN 11 03/28/2023   CREATININE 0.90 03/28/2023   BILITOT 0.6 03/28/2023   ALKPHOS 78 03/28/2023   AST 33 03/28/2023   ALT 45 (H) 03/28/2023   PROT  7.0 03/28/2023   ALBUMIN 4.9 03/28/2023   CALCIUM  9.8 03/28/2023   EGFR 106 03/28/2023   Lab Results  Component Value Date   CHOL 202 (H) 03/28/2023  Lab Results  Component Value Date   HDL 54 03/28/2023   Lab Results  Component Value Date   LDLCALC 131 (H) 03/28/2023   Lab Results  Component Value Date   TRIG 92 03/28/2023   Lab Results  Component Value Date   CHOLHDL 3.7 03/28/2023   Lab Results  Component Value Date   HGBA1C 5.7 (H) 03/28/2023      Assessment & Plan:   Problem List Items Addressed This Visit       Cardiovascular and Mediastinum   Essential hypertension - Primary   BP Readings from Last 1 Encounters:  01/23/24 131/85   Well-controlled with Lisinopril  5 mg QD, has improved diet Counseled for compliance with the medications Advised DASH diet and moderate exercise/walking, at least 150 mins/week      Relevant Orders   TSH   CMP14+EGFR   CBC with Differential/Platelet     Digestive   Gastroesophageal reflux disease   Epigastric pain could be due to gastritis/GERD Advised to take Pepcid PRN        Musculoskeletal and Integument   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 Fasting blood tests ordered today. Denied flu vaccine.      Prediabetes   Relevant Orders   Hemoglobin A1c   CMP14+EGFR   Other Visit Diagnoses       Vitamin D  deficiency       Relevant Orders   VITAMIN D  25 Hydroxy (Vit-D Deficiency, Fractures)        No orders of the defined types were placed in this encounter.   Follow-up: Return in about 1 year (around  01/22/2025).    Suzzane MARLA Blanch, MD

## 2024-01-23 NOTE — Assessment & Plan Note (Signed)
 BP Readings from Last 1 Encounters:  01/23/24 131/85   Well-controlled with Lisinopril  5 mg QD, has improved diet Counseled for compliance with the medications Advised DASH diet and moderate exercise/walking, at least 150 mins/week

## 2024-01-23 NOTE — Assessment & Plan Note (Addendum)
 Physical exam as documented. Fasting blood tests ordered today. Denied flu vaccine.

## 2024-01-23 NOTE — Patient Instructions (Signed)
 Please continue to take medications as prescribed.  Please continue to follow DASH diet and perform moderate exercise/walking at least 150 mins/week.  Please get fasting blood tests done before the next visit.

## 2024-01-23 NOTE — Assessment & Plan Note (Addendum)
 On allopurinol  Has colchicine  as needed for acute flareup, but has not had recent acute flareup Advised to avoid binge drinking Low purine diet, material provided

## 2024-01-23 NOTE — Assessment & Plan Note (Signed)
Epigastric pain could be due to gastritis/GERD Advised to take Pepcid PRN

## 2024-01-23 NOTE — Assessment & Plan Note (Signed)
On Crestor 10 mg daily Check lipid profile

## 2024-04-14 ENCOUNTER — Other Ambulatory Visit: Payer: Self-pay | Admitting: Internal Medicine

## 2024-04-14 DIAGNOSIS — E782 Mixed hyperlipidemia: Secondary | ICD-10-CM

## 2024-04-14 DIAGNOSIS — M1A9XX0 Chronic gout, unspecified, without tophus (tophi): Secondary | ICD-10-CM

## 2024-04-15 ENCOUNTER — Other Ambulatory Visit (HOSPITAL_COMMUNITY): Payer: Self-pay

## 2024-04-15 ENCOUNTER — Other Ambulatory Visit: Payer: Self-pay

## 2024-04-15 MED ORDER — ROSUVASTATIN CALCIUM 10 MG PO TABS
10.0000 mg | ORAL_TABLET | Freq: Every day | ORAL | 3 refills | Status: AC
  Filled 2024-04-15: qty 30, 30d supply, fill #0
  Filled 2024-05-12: qty 30, 30d supply, fill #1

## 2024-04-15 MED ORDER — ALLOPURINOL 100 MG PO TABS
100.0000 mg | ORAL_TABLET | Freq: Every day | ORAL | 3 refills | Status: AC
  Filled 2024-04-15: qty 30, 30d supply, fill #0
  Filled 2024-05-12: qty 30, 30d supply, fill #1

## 2024-08-11 ENCOUNTER — Ambulatory Visit: Admitting: Dermatology
